# Patient Record
Sex: Female | Born: 1975 | Race: White | Hispanic: Yes | Marital: Married | Smoking: Never smoker
Health system: Southern US, Community
[De-identification: ages and names within clinical notes are randomized; demographics above are authoritative.]

## PROBLEM LIST (undated history)

## (undated) DIAGNOSIS — R7303 Prediabetes: Secondary | ICD-10-CM

## (undated) DIAGNOSIS — Z21 Asymptomatic human immunodeficiency virus [HIV] infection status: Secondary | ICD-10-CM

## (undated) DIAGNOSIS — E785 Hyperlipidemia, unspecified: Secondary | ICD-10-CM

## (undated) DIAGNOSIS — B20 Human immunodeficiency virus [HIV] disease: Secondary | ICD-10-CM

## (undated) HISTORY — DX: Prediabetes: R73.03

## (undated) HISTORY — DX: Hyperlipidemia, unspecified: E78.5

## (undated) HISTORY — DX: Asymptomatic human immunodeficiency virus (hiv) infection status: Z21

## (undated) HISTORY — DX: Human immunodeficiency virus (HIV) disease: B20

---

## 2003-10-10 ENCOUNTER — Encounter (INDEPENDENT_AMBULATORY_CARE_PROVIDER_SITE_OTHER): Payer: Self-pay | Admitting: *Deleted

## 2003-11-15 ENCOUNTER — Encounter: Admission: RE | Admit: 2003-11-15 | Discharge: 2003-11-15 | Payer: Self-pay | Admitting: Internal Medicine

## 2003-11-15 ENCOUNTER — Ambulatory Visit (HOSPITAL_COMMUNITY): Admission: RE | Admit: 2003-11-15 | Discharge: 2003-11-15 | Payer: Self-pay | Admitting: Internal Medicine

## 2003-12-04 ENCOUNTER — Encounter: Admission: RE | Admit: 2003-12-04 | Discharge: 2003-12-04 | Payer: Self-pay | Admitting: Internal Medicine

## 2003-12-19 ENCOUNTER — Encounter: Admission: RE | Admit: 2003-12-19 | Discharge: 2003-12-19 | Payer: Self-pay | Admitting: Internal Medicine

## 2004-01-09 ENCOUNTER — Encounter: Admission: RE | Admit: 2004-01-09 | Discharge: 2004-01-09 | Payer: Self-pay | Admitting: Infectious Diseases

## 2004-04-02 ENCOUNTER — Encounter: Admission: RE | Admit: 2004-04-02 | Discharge: 2004-04-02 | Payer: Self-pay | Admitting: Internal Medicine

## 2004-04-02 ENCOUNTER — Encounter (INDEPENDENT_AMBULATORY_CARE_PROVIDER_SITE_OTHER): Payer: Self-pay | Admitting: *Deleted

## 2004-07-02 ENCOUNTER — Ambulatory Visit (HOSPITAL_COMMUNITY): Admission: RE | Admit: 2004-07-02 | Discharge: 2004-07-02 | Payer: Self-pay | Admitting: Internal Medicine

## 2004-07-02 ENCOUNTER — Encounter: Admission: RE | Admit: 2004-07-02 | Discharge: 2004-07-02 | Payer: Self-pay | Admitting: Internal Medicine

## 2004-11-13 ENCOUNTER — Ambulatory Visit: Payer: Self-pay | Admitting: Internal Medicine

## 2004-11-13 ENCOUNTER — Ambulatory Visit (HOSPITAL_COMMUNITY): Admission: RE | Admit: 2004-11-13 | Discharge: 2004-11-13 | Payer: Self-pay | Admitting: Internal Medicine

## 2004-11-27 ENCOUNTER — Ambulatory Visit: Payer: Self-pay | Admitting: Internal Medicine

## 2005-05-12 ENCOUNTER — Ambulatory Visit: Payer: Self-pay | Admitting: Internal Medicine

## 2005-05-22 ENCOUNTER — Ambulatory Visit: Payer: Self-pay | Admitting: Internal Medicine

## 2005-05-22 ENCOUNTER — Ambulatory Visit (HOSPITAL_COMMUNITY): Admission: RE | Admit: 2005-05-22 | Discharge: 2005-05-22 | Payer: Self-pay | Admitting: Internal Medicine

## 2005-09-23 ENCOUNTER — Ambulatory Visit: Payer: Self-pay | Admitting: Obstetrics and Gynecology

## 2005-09-23 ENCOUNTER — Encounter (INDEPENDENT_AMBULATORY_CARE_PROVIDER_SITE_OTHER): Payer: Self-pay | Admitting: *Deleted

## 2005-12-02 ENCOUNTER — Ambulatory Visit: Payer: Self-pay | Admitting: Internal Medicine

## 2005-12-16 ENCOUNTER — Ambulatory Visit: Payer: Self-pay | Admitting: Internal Medicine

## 2006-03-02 ENCOUNTER — Encounter (INDEPENDENT_AMBULATORY_CARE_PROVIDER_SITE_OTHER): Payer: Self-pay | Admitting: *Deleted

## 2006-03-02 ENCOUNTER — Encounter: Admission: RE | Admit: 2006-03-02 | Discharge: 2006-03-02 | Payer: Self-pay | Admitting: Internal Medicine

## 2006-03-02 ENCOUNTER — Ambulatory Visit: Payer: Self-pay | Admitting: Internal Medicine

## 2006-03-17 ENCOUNTER — Ambulatory Visit: Payer: Self-pay | Admitting: Internal Medicine

## 2006-04-01 ENCOUNTER — Ambulatory Visit: Payer: Self-pay | Admitting: Obstetrics & Gynecology

## 2006-04-30 ENCOUNTER — Ambulatory Visit: Payer: Self-pay | Admitting: Obstetrics and Gynecology

## 2006-06-02 ENCOUNTER — Encounter: Admission: RE | Admit: 2006-06-02 | Discharge: 2006-06-02 | Payer: Self-pay | Admitting: Internal Medicine

## 2006-06-02 ENCOUNTER — Encounter (INDEPENDENT_AMBULATORY_CARE_PROVIDER_SITE_OTHER): Payer: Self-pay | Admitting: *Deleted

## 2006-06-02 ENCOUNTER — Ambulatory Visit: Payer: Self-pay | Admitting: Internal Medicine

## 2006-06-02 LAB — CONVERTED CEMR LAB: HIV 1 RNA Quant: 399 copies/mL

## 2006-06-23 ENCOUNTER — Ambulatory Visit: Payer: Self-pay | Admitting: Internal Medicine

## 2006-09-08 ENCOUNTER — Ambulatory Visit: Payer: Self-pay | Admitting: Internal Medicine

## 2006-09-14 DIAGNOSIS — B20 Human immunodeficiency virus [HIV] disease: Secondary | ICD-10-CM

## 2006-09-17 ENCOUNTER — Encounter (INDEPENDENT_AMBULATORY_CARE_PROVIDER_SITE_OTHER): Payer: Self-pay | Admitting: *Deleted

## 2006-09-17 ENCOUNTER — Encounter: Payer: Self-pay | Admitting: Internal Medicine

## 2006-09-17 ENCOUNTER — Ambulatory Visit: Payer: Self-pay | Admitting: Family Medicine

## 2006-09-22 ENCOUNTER — Ambulatory Visit (HOSPITAL_COMMUNITY): Admission: RE | Admit: 2006-09-22 | Discharge: 2006-09-22 | Payer: Self-pay | Admitting: Family Medicine

## 2006-10-15 ENCOUNTER — Ambulatory Visit: Payer: Self-pay | Admitting: Family Medicine

## 2006-10-20 ENCOUNTER — Ambulatory Visit (HOSPITAL_COMMUNITY): Admission: RE | Admit: 2006-10-20 | Discharge: 2006-10-20 | Payer: Self-pay | Admitting: Family Medicine

## 2006-11-05 ENCOUNTER — Ambulatory Visit: Payer: Self-pay | Admitting: Family Medicine

## 2006-11-12 ENCOUNTER — Encounter (INDEPENDENT_AMBULATORY_CARE_PROVIDER_SITE_OTHER): Payer: Self-pay | Admitting: *Deleted

## 2006-11-12 ENCOUNTER — Encounter: Admission: RE | Admit: 2006-11-12 | Discharge: 2006-11-12 | Payer: Self-pay | Admitting: Internal Medicine

## 2006-11-12 ENCOUNTER — Ambulatory Visit: Payer: Self-pay | Admitting: Internal Medicine

## 2006-11-12 LAB — CONVERTED CEMR LAB
Albumin: 3.6 g/dL (ref 3.5–5.2)
Alkaline Phosphatase: 49 units/L (ref 39–117)
CD4 Count: 150 microliters
Calcium: 8.6 mg/dL (ref 8.4–10.5)
Chloride: 105 meq/L (ref 96–112)
Eosinophils Relative: 2 % (ref 0–5)
Glucose, Bld: 97 mg/dL (ref 70–99)
HCT: 35.3 % — ABNORMAL LOW (ref 36.0–46.0)
HIV 1 RNA Quant: 177 copies/mL
HIV-1 RNA Quant, Log: 2.25 — ABNORMAL HIGH (ref <1.70)
Lymphocytes Relative: 9 % — ABNORMAL LOW (ref 12–46)
Neutro Abs: 6.2 10*3/uL (ref 1.7–7.7)
Neutrophils Relative %: 82 % — ABNORMAL HIGH (ref 43–77)
Platelets: 286 10*3/uL (ref 150–400)
Potassium: 4.3 meq/L (ref 3.5–5.3)
RDW: 13.6 % (ref 11.5–14.0)
Sodium: 134 meq/L — ABNORMAL LOW (ref 135–145)
Total Protein: 7 g/dL (ref 6.0–8.3)
WBC: 7.6 10*3/uL (ref 4.0–10.5)

## 2006-11-26 ENCOUNTER — Ambulatory Visit: Payer: Self-pay | Admitting: Family Medicine

## 2006-12-01 ENCOUNTER — Encounter (INDEPENDENT_AMBULATORY_CARE_PROVIDER_SITE_OTHER): Payer: Self-pay | Admitting: *Deleted

## 2006-12-01 ENCOUNTER — Ambulatory Visit: Payer: Self-pay | Admitting: Internal Medicine

## 2006-12-01 ENCOUNTER — Encounter: Admission: RE | Admit: 2006-12-01 | Discharge: 2006-12-01 | Payer: Self-pay | Admitting: Internal Medicine

## 2006-12-01 DIAGNOSIS — B029 Zoster without complications: Secondary | ICD-10-CM | POA: Insufficient documentation

## 2006-12-01 DIAGNOSIS — R87619 Unspecified abnormal cytological findings in specimens from cervix uteri: Secondary | ICD-10-CM | POA: Insufficient documentation

## 2006-12-21 ENCOUNTER — Encounter: Payer: Self-pay | Admitting: Internal Medicine

## 2006-12-24 ENCOUNTER — Ambulatory Visit: Payer: Self-pay | Admitting: Family Medicine

## 2007-01-04 ENCOUNTER — Encounter (INDEPENDENT_AMBULATORY_CARE_PROVIDER_SITE_OTHER): Payer: Self-pay | Admitting: *Deleted

## 2007-01-04 LAB — CONVERTED CEMR LAB

## 2007-01-07 ENCOUNTER — Ambulatory Visit: Payer: Self-pay | Admitting: Obstetrics & Gynecology

## 2007-01-17 ENCOUNTER — Encounter (INDEPENDENT_AMBULATORY_CARE_PROVIDER_SITE_OTHER): Payer: Self-pay | Admitting: *Deleted

## 2007-01-25 ENCOUNTER — Telehealth (INDEPENDENT_AMBULATORY_CARE_PROVIDER_SITE_OTHER): Payer: Self-pay | Admitting: Infectious Diseases

## 2007-01-28 ENCOUNTER — Ambulatory Visit: Payer: Self-pay | Admitting: *Deleted

## 2007-02-11 ENCOUNTER — Ambulatory Visit: Payer: Self-pay | Admitting: Family Medicine

## 2007-02-15 ENCOUNTER — Encounter: Admission: RE | Admit: 2007-02-15 | Discharge: 2007-02-15 | Payer: Self-pay | Admitting: Internal Medicine

## 2007-02-15 ENCOUNTER — Ambulatory Visit: Payer: Self-pay | Admitting: Internal Medicine

## 2007-02-15 LAB — CONVERTED CEMR LAB
Albumin: 3.3 g/dL — ABNORMAL LOW (ref 3.5–5.2)
Alkaline Phosphatase: 83 units/L (ref 39–117)
BUN: 6 mg/dL (ref 6–23)
CD4 Count: 430 microliters
Eosinophils Absolute: 0 10*3/uL (ref 0.0–0.7)
Eosinophils Relative: 1 % (ref 0–5)
Glucose, Bld: 90 mg/dL (ref 70–99)
HCT: 37.3 % (ref 36.0–46.0)
HDL: 40 mg/dL (ref >39)
Lymphs Abs: 1.4 10*3/uL (ref 0.7–3.3)
MCV: 111.7 fL — ABNORMAL HIGH (ref 78.0–100.0)
Monocytes Relative: 6 % (ref 3–11)
RBC: 3.34 M/uL — ABNORMAL LOW (ref 3.87–5.11)
Total Bilirubin: 0.5 mg/dL (ref 0.3–1.2)
Triglycerides: 588 mg/dL — ABNORMAL HIGH (ref <150)
WBC: 6.1 10*3/uL (ref 4.0–10.5)

## 2007-02-22 ENCOUNTER — Telehealth: Payer: Self-pay | Admitting: Internal Medicine

## 2007-02-25 ENCOUNTER — Ambulatory Visit: Payer: Self-pay | Admitting: Obstetrics & Gynecology

## 2007-03-02 ENCOUNTER — Ambulatory Visit: Payer: Self-pay | Admitting: Internal Medicine

## 2007-03-04 ENCOUNTER — Ambulatory Visit: Payer: Self-pay | Admitting: Obstetrics & Gynecology

## 2007-03-11 ENCOUNTER — Ambulatory Visit: Payer: Self-pay | Admitting: Family Medicine

## 2007-03-18 ENCOUNTER — Inpatient Hospital Stay (HOSPITAL_COMMUNITY): Admission: AD | Admit: 2007-03-18 | Discharge: 2007-03-20 | Payer: Self-pay | Admitting: Obstetrics and Gynecology

## 2007-03-18 ENCOUNTER — Ambulatory Visit: Payer: Self-pay | Admitting: Obstetrics & Gynecology

## 2007-03-18 ENCOUNTER — Ambulatory Visit: Payer: Self-pay | Admitting: Certified Nurse Midwife

## 2007-03-19 ENCOUNTER — Telehealth: Payer: Self-pay | Admitting: Internal Medicine

## 2007-04-20 ENCOUNTER — Telehealth: Payer: Self-pay | Admitting: Internal Medicine

## 2007-05-21 ENCOUNTER — Telehealth: Payer: Self-pay | Admitting: Internal Medicine

## 2007-06-01 ENCOUNTER — Ambulatory Visit: Payer: Self-pay | Admitting: Internal Medicine

## 2007-06-01 ENCOUNTER — Encounter: Admission: RE | Admit: 2007-06-01 | Discharge: 2007-06-01 | Payer: Self-pay | Admitting: Internal Medicine

## 2007-06-01 LAB — CONVERTED CEMR LAB
AST: 16 units/L (ref 0–37)
BUN: 10 mg/dL (ref 6–23)
Calcium: 8.4 mg/dL (ref 8.4–10.5)
Chloride: 106 meq/L (ref 96–112)
Creatinine, Ser: 0.69 mg/dL (ref 0.40–1.20)
Glucose, Bld: 92 mg/dL (ref 70–99)
HCT: 37.6 % (ref 36.0–46.0)
HIV 1 RNA Quant: 125 copies/mL — ABNORMAL HIGH (ref <50)
HIV-1 RNA Quant, Log: 2.1 — ABNORMAL HIGH (ref <1.70)
Hemoglobin: 12.7 g/dL (ref 12.0–15.0)
RDW: 13.4 % (ref 11.5–14.0)

## 2007-06-14 ENCOUNTER — Encounter (INDEPENDENT_AMBULATORY_CARE_PROVIDER_SITE_OTHER): Payer: Self-pay | Admitting: *Deleted

## 2007-06-15 ENCOUNTER — Ambulatory Visit: Payer: Self-pay | Admitting: Internal Medicine

## 2007-06-18 ENCOUNTER — Telehealth: Payer: Self-pay | Admitting: Internal Medicine

## 2007-07-19 ENCOUNTER — Telehealth: Payer: Self-pay | Admitting: Internal Medicine

## 2007-08-12 ENCOUNTER — Telehealth: Payer: Self-pay | Admitting: Internal Medicine

## 2007-08-31 ENCOUNTER — Ambulatory Visit: Payer: Self-pay | Admitting: Internal Medicine

## 2007-08-31 ENCOUNTER — Encounter: Admission: RE | Admit: 2007-08-31 | Discharge: 2007-08-31 | Payer: Self-pay | Admitting: Internal Medicine

## 2007-08-31 LAB — CONVERTED CEMR LAB
ALT: 18 units/L (ref 0–35)
Albumin: 4.1 g/dL (ref 3.5–5.2)
CO2: 19 meq/L (ref 19–32)
Calcium: 8.4 mg/dL (ref 8.4–10.5)
Chloride: 107 meq/L (ref 96–112)
Glucose, Bld: 87 mg/dL (ref 70–99)
HIV 1 RNA Quant: 69 copies/mL — ABNORMAL HIGH (ref <50)
HIV-1 RNA Quant, Log: 1.84 — ABNORMAL HIGH (ref <1.70)
MCV: 105 fL — ABNORMAL HIGH (ref 78.0–100.0)
Potassium: 4 meq/L (ref 3.5–5.3)
RBC: 3.81 M/uL — ABNORMAL LOW (ref 3.87–5.11)
Sodium: 138 meq/L (ref 135–145)
Total Protein: 7.8 g/dL (ref 6.0–8.3)
WBC: 6.4 10*3/uL (ref 4.0–10.5)

## 2007-09-09 ENCOUNTER — Telehealth: Payer: Self-pay | Admitting: Internal Medicine

## 2007-09-14 ENCOUNTER — Ambulatory Visit: Payer: Self-pay | Admitting: Internal Medicine

## 2007-09-20 ENCOUNTER — Telehealth: Payer: Self-pay | Admitting: Internal Medicine

## 2007-10-14 ENCOUNTER — Telehealth: Payer: Self-pay | Admitting: Internal Medicine

## 2007-11-19 ENCOUNTER — Telehealth: Payer: Self-pay | Admitting: Internal Medicine

## 2007-12-16 ENCOUNTER — Telehealth: Payer: Self-pay | Admitting: Internal Medicine

## 2007-12-20 ENCOUNTER — Telehealth: Payer: Self-pay | Admitting: Internal Medicine

## 2007-12-29 ENCOUNTER — Encounter: Admission: RE | Admit: 2007-12-29 | Discharge: 2007-12-29 | Payer: Self-pay | Admitting: Internal Medicine

## 2007-12-29 ENCOUNTER — Ambulatory Visit: Payer: Self-pay | Admitting: Internal Medicine

## 2007-12-29 LAB — CONVERTED CEMR LAB
ALT: 25 units/L (ref 0–35)
AST: 18 units/L (ref 0–37)
Albumin: 4.2 g/dL (ref 3.5–5.2)
Alkaline Phosphatase: 72 units/L (ref 39–117)
Calcium: 8.6 mg/dL (ref 8.4–10.5)
Chloride: 105 meq/L (ref 96–112)
Creatinine, Ser: 0.56 mg/dL (ref 0.40–1.20)
HIV 1 RNA Quant: <50 copies/mL (ref <50)
LDL Cholesterol: 114 mg/dL — ABNORMAL HIGH (ref 0–99)
MCHC: 33.3 g/dL (ref 30.0–36.0)
Platelets: 282 10*3/uL (ref 150–400)
Potassium: 4.4 meq/L (ref 3.5–5.3)
RDW: 13.2 % (ref 11.5–15.5)
Total CHOL/HDL Ratio: 4.3

## 2008-01-13 ENCOUNTER — Telehealth: Payer: Self-pay | Admitting: Internal Medicine

## 2008-01-25 ENCOUNTER — Ambulatory Visit: Payer: Self-pay | Admitting: Internal Medicine

## 2008-01-31 ENCOUNTER — Encounter (INDEPENDENT_AMBULATORY_CARE_PROVIDER_SITE_OTHER): Payer: Self-pay | Admitting: *Deleted

## 2008-02-07 ENCOUNTER — Telehealth (INDEPENDENT_AMBULATORY_CARE_PROVIDER_SITE_OTHER): Payer: Self-pay | Admitting: *Deleted

## 2008-02-15 ENCOUNTER — Encounter (INDEPENDENT_AMBULATORY_CARE_PROVIDER_SITE_OTHER): Payer: Self-pay | Admitting: *Deleted

## 2008-03-09 ENCOUNTER — Telehealth (INDEPENDENT_AMBULATORY_CARE_PROVIDER_SITE_OTHER): Payer: Self-pay | Admitting: *Deleted

## 2008-04-05 ENCOUNTER — Encounter: Payer: Self-pay | Admitting: Internal Medicine

## 2008-04-10 ENCOUNTER — Ambulatory Visit: Payer: Self-pay | Admitting: Internal Medicine

## 2008-04-10 ENCOUNTER — Encounter: Payer: Self-pay | Admitting: Internal Medicine

## 2008-04-10 LAB — CONVERTED CEMR LAB: Pap Smear: NORMAL

## 2008-04-12 ENCOUNTER — Telehealth (INDEPENDENT_AMBULATORY_CARE_PROVIDER_SITE_OTHER): Payer: Self-pay | Admitting: *Deleted

## 2008-04-17 ENCOUNTER — Encounter: Payer: Self-pay | Admitting: Internal Medicine

## 2008-05-09 ENCOUNTER — Telehealth (INDEPENDENT_AMBULATORY_CARE_PROVIDER_SITE_OTHER): Payer: Self-pay | Admitting: *Deleted

## 2008-06-06 ENCOUNTER — Telehealth: Payer: Self-pay | Admitting: Internal Medicine

## 2008-07-05 ENCOUNTER — Telehealth (INDEPENDENT_AMBULATORY_CARE_PROVIDER_SITE_OTHER): Payer: Self-pay | Admitting: *Deleted

## 2008-07-19 ENCOUNTER — Ambulatory Visit: Payer: Self-pay | Admitting: Internal Medicine

## 2008-07-19 LAB — CONVERTED CEMR LAB
ALT: 61 units/L — ABNORMAL HIGH (ref 0–35)
AST: 31 units/L (ref 0–37)
Alkaline Phosphatase: 74 units/L (ref 39–117)
BUN: 18 mg/dL (ref 6–23)
Calcium: 8.9 mg/dL (ref 8.4–10.5)
Chloride: 106 meq/L (ref 96–112)
Creatinine, Ser: 0.65 mg/dL (ref 0.40–1.20)
HCT: 40.2 % (ref 36.0–46.0)
Hemoglobin: 13.3 g/dL (ref 12.0–15.0)
MCHC: 33.1 g/dL (ref 30.0–36.0)
Platelets: 280 10*3/uL (ref 150–400)
RDW: 13.3 % (ref 11.5–15.5)
Total Bilirubin: 0.2 mg/dL — ABNORMAL LOW (ref 0.3–1.2)

## 2008-08-03 ENCOUNTER — Telehealth (INDEPENDENT_AMBULATORY_CARE_PROVIDER_SITE_OTHER): Payer: Self-pay | Admitting: *Deleted

## 2008-08-03 ENCOUNTER — Ambulatory Visit: Payer: Self-pay | Admitting: Internal Medicine

## 2008-08-03 DIAGNOSIS — L659 Nonscarring hair loss, unspecified: Secondary | ICD-10-CM | POA: Insufficient documentation

## 2008-08-31 ENCOUNTER — Telehealth (INDEPENDENT_AMBULATORY_CARE_PROVIDER_SITE_OTHER): Payer: Self-pay | Admitting: *Deleted

## 2008-09-26 ENCOUNTER — Telehealth (INDEPENDENT_AMBULATORY_CARE_PROVIDER_SITE_OTHER): Payer: Self-pay | Admitting: *Deleted

## 2008-10-27 ENCOUNTER — Telehealth (INDEPENDENT_AMBULATORY_CARE_PROVIDER_SITE_OTHER): Payer: Self-pay | Admitting: *Deleted

## 2008-11-07 ENCOUNTER — Ambulatory Visit: Payer: Self-pay | Admitting: Internal Medicine

## 2008-11-07 LAB — CONVERTED CEMR LAB
HIV 1 RNA Quant: 328 copies/mL — ABNORMAL HIGH (ref <48)
HIV-1 RNA Quant, Log: 2.52 — ABNORMAL HIGH (ref <1.68)

## 2008-11-23 ENCOUNTER — Telehealth (INDEPENDENT_AMBULATORY_CARE_PROVIDER_SITE_OTHER): Payer: Self-pay | Admitting: *Deleted

## 2008-11-28 ENCOUNTER — Ambulatory Visit: Payer: Self-pay | Admitting: Internal Medicine

## 2008-11-28 DIAGNOSIS — M545 Low back pain: Secondary | ICD-10-CM

## 2008-12-20 ENCOUNTER — Telehealth (INDEPENDENT_AMBULATORY_CARE_PROVIDER_SITE_OTHER): Payer: Self-pay | Admitting: *Deleted

## 2008-12-20 ENCOUNTER — Encounter: Payer: Self-pay | Admitting: Internal Medicine

## 2009-01-11 ENCOUNTER — Encounter (INDEPENDENT_AMBULATORY_CARE_PROVIDER_SITE_OTHER): Payer: Self-pay | Admitting: *Deleted

## 2009-01-17 ENCOUNTER — Telehealth (INDEPENDENT_AMBULATORY_CARE_PROVIDER_SITE_OTHER): Payer: Self-pay | Admitting: *Deleted

## 2009-01-31 ENCOUNTER — Encounter (INDEPENDENT_AMBULATORY_CARE_PROVIDER_SITE_OTHER): Payer: Self-pay | Admitting: *Deleted

## 2009-02-15 ENCOUNTER — Telehealth (INDEPENDENT_AMBULATORY_CARE_PROVIDER_SITE_OTHER): Payer: Self-pay | Admitting: *Deleted

## 2009-03-14 ENCOUNTER — Telehealth (INDEPENDENT_AMBULATORY_CARE_PROVIDER_SITE_OTHER): Payer: Self-pay | Admitting: *Deleted

## 2009-04-16 ENCOUNTER — Telehealth (INDEPENDENT_AMBULATORY_CARE_PROVIDER_SITE_OTHER): Payer: Self-pay | Admitting: *Deleted

## 2009-05-10 ENCOUNTER — Telehealth (INDEPENDENT_AMBULATORY_CARE_PROVIDER_SITE_OTHER): Payer: Self-pay | Admitting: *Deleted

## 2009-05-22 ENCOUNTER — Ambulatory Visit: Payer: Self-pay | Admitting: Internal Medicine

## 2009-05-22 LAB — CONVERTED CEMR LAB
HIV 1 RNA Quant: 95 copies/mL — ABNORMAL HIGH (ref <48)
HIV-1 RNA Quant, Log: 1.98 — ABNORMAL HIGH (ref <1.68)

## 2009-06-05 ENCOUNTER — Ambulatory Visit: Payer: Self-pay | Admitting: Internal Medicine

## 2009-06-05 ENCOUNTER — Encounter: Payer: Self-pay | Admitting: Internal Medicine

## 2009-06-05 ENCOUNTER — Telehealth (INDEPENDENT_AMBULATORY_CARE_PROVIDER_SITE_OTHER): Payer: Self-pay | Admitting: *Deleted

## 2009-06-05 LAB — CONVERTED CEMR LAB
Cholesterol: 207 mg/dL — ABNORMAL HIGH (ref 0–200)
Triglycerides: 142 mg/dL (ref <150)
VLDL: 28 mg/dL (ref 0–40)

## 2009-06-13 ENCOUNTER — Encounter: Payer: Self-pay | Admitting: Internal Medicine

## 2009-07-03 ENCOUNTER — Telehealth (INDEPENDENT_AMBULATORY_CARE_PROVIDER_SITE_OTHER): Payer: Self-pay | Admitting: *Deleted

## 2009-08-01 ENCOUNTER — Telehealth (INDEPENDENT_AMBULATORY_CARE_PROVIDER_SITE_OTHER): Payer: Self-pay | Admitting: *Deleted

## 2009-08-29 ENCOUNTER — Telehealth (INDEPENDENT_AMBULATORY_CARE_PROVIDER_SITE_OTHER): Payer: Self-pay | Admitting: *Deleted

## 2009-09-27 ENCOUNTER — Telehealth (INDEPENDENT_AMBULATORY_CARE_PROVIDER_SITE_OTHER): Payer: Self-pay | Admitting: *Deleted

## 2009-10-09 ENCOUNTER — Ambulatory Visit: Payer: Self-pay | Admitting: Internal Medicine

## 2009-10-09 LAB — CONVERTED CEMR LAB
Albumin: 3.8 g/dL (ref 3.5–5.2)
Alkaline Phosphatase: 55 units/L (ref 39–117)
BUN: 13 mg/dL (ref 6–23)
CO2: 20 meq/L (ref 19–32)
Calcium: 7.7 mg/dL — ABNORMAL LOW (ref 8.4–10.5)
Chloride: 105 meq/L (ref 96–112)
Eosinophils Absolute: 0.1 10*3/uL (ref 0.0–0.7)
Eosinophils Relative: 2 % (ref 0–5)
Glucose, Bld: 109 mg/dL — ABNORMAL HIGH (ref 70–99)
HDL: 49 mg/dL (ref >39)
Hemoglobin: 13.2 g/dL (ref 12.0–15.0)
LDL Cholesterol: 139 mg/dL — ABNORMAL HIGH (ref 0–99)
Lymphs Abs: 2 10*3/uL (ref 0.7–4.0)
MCHC: 33.3 g/dL (ref 30.0–36.0)
Monocytes Absolute: 0.4 10*3/uL (ref 0.1–1.0)
Platelets: 302 10*3/uL (ref 150–400)
Potassium: 4.1 meq/L (ref 3.5–5.3)
RBC: 4.42 M/uL (ref 3.87–5.11)
RDW: 13 % (ref 11.5–15.5)
Sodium: 139 meq/L (ref 135–145)
Total CHOL/HDL Ratio: 4.4
Total Protein: 7.1 g/dL (ref 6.0–8.3)
VLDL: 27 mg/dL (ref 0–40)

## 2009-10-23 ENCOUNTER — Ambulatory Visit: Payer: Self-pay | Admitting: Internal Medicine

## 2009-11-06 ENCOUNTER — Telehealth (INDEPENDENT_AMBULATORY_CARE_PROVIDER_SITE_OTHER): Payer: Self-pay | Admitting: *Deleted

## 2009-11-26 ENCOUNTER — Telehealth: Payer: Self-pay | Admitting: Internal Medicine

## 2009-11-29 ENCOUNTER — Telehealth (INDEPENDENT_AMBULATORY_CARE_PROVIDER_SITE_OTHER): Payer: Self-pay | Admitting: *Deleted

## 2009-12-12 ENCOUNTER — Telehealth (INDEPENDENT_AMBULATORY_CARE_PROVIDER_SITE_OTHER): Payer: Self-pay | Admitting: *Deleted

## 2009-12-18 ENCOUNTER — Encounter (INDEPENDENT_AMBULATORY_CARE_PROVIDER_SITE_OTHER): Payer: Self-pay | Admitting: *Deleted

## 2009-12-25 ENCOUNTER — Ambulatory Visit: Payer: Self-pay | Admitting: Internal Medicine

## 2009-12-26 ENCOUNTER — Encounter: Payer: Self-pay | Admitting: Internal Medicine

## 2009-12-27 ENCOUNTER — Telehealth (INDEPENDENT_AMBULATORY_CARE_PROVIDER_SITE_OTHER): Payer: Self-pay | Admitting: *Deleted

## 2010-01-21 ENCOUNTER — Telehealth (INDEPENDENT_AMBULATORY_CARE_PROVIDER_SITE_OTHER): Payer: Self-pay | Admitting: *Deleted

## 2010-01-30 ENCOUNTER — Encounter (INDEPENDENT_AMBULATORY_CARE_PROVIDER_SITE_OTHER): Payer: Self-pay | Admitting: *Deleted

## 2010-02-21 ENCOUNTER — Telehealth (INDEPENDENT_AMBULATORY_CARE_PROVIDER_SITE_OTHER): Payer: Self-pay | Admitting: *Deleted

## 2010-02-25 ENCOUNTER — Ambulatory Visit: Payer: Self-pay | Admitting: Internal Medicine

## 2010-02-26 ENCOUNTER — Encounter (INDEPENDENT_AMBULATORY_CARE_PROVIDER_SITE_OTHER): Payer: Self-pay | Admitting: *Deleted

## 2010-02-27 ENCOUNTER — Telehealth (INDEPENDENT_AMBULATORY_CARE_PROVIDER_SITE_OTHER): Payer: Self-pay | Admitting: *Deleted

## 2010-03-27 ENCOUNTER — Telehealth (INDEPENDENT_AMBULATORY_CARE_PROVIDER_SITE_OTHER): Payer: Self-pay | Admitting: *Deleted

## 2010-04-23 ENCOUNTER — Telehealth (INDEPENDENT_AMBULATORY_CARE_PROVIDER_SITE_OTHER): Payer: Self-pay | Admitting: *Deleted

## 2010-04-23 ENCOUNTER — Ambulatory Visit: Payer: Self-pay | Admitting: Internal Medicine

## 2010-04-23 LAB — CONVERTED CEMR LAB
ALT: 66 units/L — ABNORMAL HIGH (ref 0–35)
AST: 41 units/L — ABNORMAL HIGH (ref 0–37)
BUN: 10 mg/dL (ref 6–23)
Basophils Absolute: 0 10*3/uL (ref 0.0–0.1)
Basophils Relative: 0 % (ref 0–1)
Creatinine, Ser: 0.55 mg/dL (ref 0.40–1.20)
Eosinophils Absolute: 0.2 10*3/uL (ref 0.0–0.7)
Eosinophils Relative: 3 % (ref 0–5)
HDL: 47 mg/dL (ref >39)
HIV 1 RNA Quant: <48 copies/mL (ref <48)
MCHC: 33.5 g/dL (ref 30.0–36.0)
MCV: 97.6 fL (ref 78.0–100.0)
Neutrophils Relative %: 52 % (ref 43–77)
Platelets: 281 10*3/uL (ref 150–400)
RDW: 13 % (ref 11.5–15.5)
Total Bilirubin: 0.4 mg/dL (ref 0.3–1.2)
Total CHOL/HDL Ratio: 4.5
VLDL: 32 mg/dL (ref 0–40)
WBC: 5.2 10*3/uL (ref 4.0–10.5)

## 2010-05-07 ENCOUNTER — Ambulatory Visit: Payer: Self-pay | Admitting: Internal Medicine

## 2010-05-07 DIAGNOSIS — R109 Unspecified abdominal pain: Secondary | ICD-10-CM | POA: Insufficient documentation

## 2010-05-14 ENCOUNTER — Telehealth (INDEPENDENT_AMBULATORY_CARE_PROVIDER_SITE_OTHER): Payer: Self-pay | Admitting: *Deleted

## 2010-05-31 ENCOUNTER — Ambulatory Visit: Payer: Self-pay | Admitting: Internal Medicine

## 2010-06-04 ENCOUNTER — Encounter: Payer: Self-pay | Admitting: Internal Medicine

## 2010-06-07 ENCOUNTER — Telehealth (INDEPENDENT_AMBULATORY_CARE_PROVIDER_SITE_OTHER): Payer: Self-pay | Admitting: *Deleted

## 2010-07-09 ENCOUNTER — Telehealth (INDEPENDENT_AMBULATORY_CARE_PROVIDER_SITE_OTHER): Payer: Self-pay | Admitting: *Deleted

## 2010-08-02 ENCOUNTER — Telehealth (INDEPENDENT_AMBULATORY_CARE_PROVIDER_SITE_OTHER): Payer: Self-pay | Admitting: *Deleted

## 2010-08-06 ENCOUNTER — Ambulatory Visit: Payer: Self-pay | Admitting: Internal Medicine

## 2010-08-06 LAB — CONVERTED CEMR LAB
AST: 47 units/L — ABNORMAL HIGH (ref 0–37)
Alkaline Phosphatase: 62 units/L (ref 39–117)
BUN: 12 mg/dL (ref 6–23)
Basophils Relative: 0 % (ref 0–1)
Eosinophils Absolute: 0.1 10*3/uL (ref 0.0–0.7)
Eosinophils Relative: 2 % (ref 0–5)
Glucose, Bld: 86 mg/dL (ref 70–99)
HCT: 36.4 % (ref 36.0–46.0)
HIV 1 RNA Quant: <20 copies/mL (ref <20)
HIV-1 RNA Quant, Log: <1.3 (ref <1.30)
Lymphs Abs: 1.7 10*3/uL (ref 0.7–4.0)
MCHC: 34.1 g/dL (ref 30.0–36.0)
MCV: 89 fL (ref 78.0–100.0)
Monocytes Relative: 8 % (ref 3–12)
Neutrophils Relative %: 56 % (ref 43–77)
Platelets: 328 10*3/uL (ref 150–400)
Potassium: 4.4 meq/L (ref 3.5–5.3)
RBC: 4.09 M/uL (ref 3.87–5.11)
Total Bilirubin: 0.3 mg/dL (ref 0.3–1.2)
WBC: 5.2 10*3/uL (ref 4.0–10.5)

## 2010-09-02 ENCOUNTER — Telehealth (INDEPENDENT_AMBULATORY_CARE_PROVIDER_SITE_OTHER): Payer: Self-pay | Admitting: *Deleted

## 2010-09-10 ENCOUNTER — Ambulatory Visit: Payer: Self-pay | Admitting: Internal Medicine

## 2010-09-10 DIAGNOSIS — F4321 Adjustment disorder with depressed mood: Secondary | ICD-10-CM

## 2010-10-08 ENCOUNTER — Encounter (INDEPENDENT_AMBULATORY_CARE_PROVIDER_SITE_OTHER): Payer: Self-pay | Admitting: *Deleted

## 2010-11-23 ENCOUNTER — Emergency Department (HOSPITAL_COMMUNITY)
Admission: EM | Admit: 2010-11-23 | Discharge: 2010-11-23 | Payer: Self-pay | Source: Home / Self Care | Admitting: Emergency Medicine

## 2010-12-01 ENCOUNTER — Encounter: Payer: Self-pay | Admitting: *Deleted

## 2010-12-05 ENCOUNTER — Encounter: Payer: Self-pay | Admitting: Internal Medicine

## 2010-12-05 ENCOUNTER — Ambulatory Visit
Admission: RE | Admit: 2010-12-05 | Discharge: 2010-12-05 | Payer: Self-pay | Source: Home / Self Care | Attending: Internal Medicine | Admitting: Internal Medicine

## 2010-12-05 LAB — CONVERTED CEMR LAB
ALT: 60 units/L — ABNORMAL HIGH (ref 0–35)
AST: 36 units/L (ref 0–37)
Albumin: 4.1 g/dL (ref 3.5–5.2)
Basophils Absolute: 0 10*3/uL (ref 0.0–0.1)
CO2: 26 meq/L (ref 19–32)
Calcium: 8.6 mg/dL (ref 8.4–10.5)
Chloride: 105 meq/L (ref 96–112)
Cholesterol: 233 mg/dL — ABNORMAL HIGH (ref 0–200)
Creatinine, Ser: 0.58 mg/dL (ref 0.40–1.20)
Eosinophils Relative: 2 % (ref 0–5)
HCT: 40.5 % (ref 36.0–46.0)
HIV 1 RNA Quant: <20 copies/mL (ref <20)
HIV-1 RNA Quant, Log: <1.3 (ref <1.30)
Lymphocytes Relative: 35 % (ref 12–46)
Lymphs Abs: 2.1 10*3/uL (ref 0.7–4.0)
Neutrophils Relative %: 56 % (ref 43–77)
Platelets: 312 10*3/uL (ref 150–400)
Potassium: 4.5 meq/L (ref 3.5–5.3)
RDW: 12.5 % (ref 11.5–15.5)
Sodium: 136 meq/L (ref 135–145)
Total CHOL/HDL Ratio: 4.2
Total Protein: 7.6 g/dL (ref 6.0–8.3)
Triglycerides: 122 mg/dL (ref <150)
WBC: 5.9 10*3/uL (ref 4.0–10.5)

## 2010-12-06 LAB — T-HELPER CELL (CD4) - (RCID CLINIC ONLY)
CD4 % Helper T Cell: 35 % (ref 33–55)
CD4 T Cell Abs: 690 uL (ref 400–2700)

## 2010-12-10 NOTE — Miscellaneous (Signed)
Summary: RW Update  Clinical Lists Changes  Observations: Added new observation of PREGTHISYR: No (12/18/2009 7:51)

## 2010-12-10 NOTE — Progress Notes (Signed)
Summary: [facility]/pt assist med arrived for Jan  Phone Note Refill Request      Prescriptions: ATRIPLA 600-200-300 MG TABS (EFAVIRENZ-EMTRICITAB-TENOFOVIR) Take 1 tablet by mouth once a day  #30 x 0   Entered by:   Paulo Fruit  BS,CPht II,MPH   Authorized by:   Cliffton Asters MD   Signed by:   Paulo Fruit  BS,CPht II,MPH on 11/29/2009   Method used:   Samples Given   RxID:   [id number]   Patient Assist Medication Verification: Medication: Atripla Lot# [id number] Exp Date:06 2013 Tech approval:MLD               **Patient had lost their medication and picked up a sample supply yesterday** Patient normally comes to pick up medications every month before running out. Isel Skufca  BS,CPht II,MPH  November 29, 2009 4:27 PM

## 2010-12-10 NOTE — Letter (Signed)
Summary: Results Follow-up Letter  [facility]  [address]   [place], [place] [address]   Phone: [phone]  Fax: [phone]          June 04, 2010  [address] [place], [place]  [address]  [place] PEREZ-FUERTE, Los siguientes son los Vining de su reciente prueba (s): Resultado de la prueba Papanicolau Normal_XXX___ No Normal_____ Comentarios: Todo fue normal. Nos vemos en un ao para su prxima prueba Pap. Gracias por venir al [employer] para su atencin. Larna [facility]

## 2010-12-10 NOTE — Progress Notes (Signed)
Summary: ncadap med arrive for sept-pt aware  Phone Note Refill Request      Prescriptions: ATRIPLA 600-200-300 MG TABS (EFAVIRENZ-EMTRICITAB-TENOFOVIR) Take 1 tablet by mouth once a day  #30 x 0   Entered by:   Paulo Fruit  BS,CPht II,MPH   Authorized by:   Cliffton Asters MD   Signed by:   Paulo Fruit  BS,CPht II,MPH on 08/02/2010   Method used:   Samples Given   RxID:   [id number]  Patient Assist Medication Verification: Medication name: Neva Seat # [id number] Tech approval:mld Call placed to patient with message that assistance medications are ready for pick-up. Rebecca Cairns  BS,CPht II,MPH  August 02, 2010 12:08 PM

## 2010-12-10 NOTE — Progress Notes (Signed)
Summary: ncadap med arrived for aug  Phone Note Refill Request      Prescriptions: ATRIPLA 600-200-300 MG TABS (EFAVIRENZ-EMTRICITAB-TENOFOVIR) Take 1 tablet by mouth once a day  #30 x 0   Entered by:   Paulo Fruit  BS,CPht II,MPH   Authorized by:   Cliffton Asters MD   Signed by:   Paulo Fruit  BS,CPht II,MPH on 07/09/2010   Method used:   Samples Given   RxID:   [id number]  Patient Assist Medication Verification: Medication name:Atripla RX # [id number] Tech approval:MLD  Patient is on her way to pick up. Hafsa Lohn  BS,CPht II,MPH  July 09, 2010 2:37 PM

## 2010-12-10 NOTE — Assessment & Plan Note (Signed)
Summary: F/U/CALLED FOR INTERPRETER/VS   CC:  follow-up visit and needs PAP in July 2011.  History of Present Illness: Kathleen Savage is in for her routine visit.  She states that she was unable to come and pick up her medication and therefore was out of her Atripla for 3 days.  Otherwise she has not missed a single dose.  Her only recent problem has been some intermittent fleeting, abdominal pains.  They tend to occur early in the morning before she eats breakfast but she does not feel that they are related to being hungry.  They come and go very quickly but can be as painful as 9 on a scale of one to 10.  They happen several times each week.  She does not know of anything that causes them were prevents them.  She does not have any nausea, vomiting, constipation, diarrhea or other problems.  She says she has tried to lose weight in the past but does not know how to put together a proper diet.  She says she would very much like to be able to lose weight and keep it off.  Preventive Screening-Counseling & Management  Alcohol-Tobacco     Alcohol drinks/day: 0     Smoking Status: never     Passive Smoke Exposure: no  Caffeine-Diet-Exercise     Caffeine use/day: yes     Does Patient Exercise: no, not walking fast     Type of exercise: walking     Exercise (avg: min/session): 30-60     Times/week: <3  Hep-HIV-STD-Contraception     HIV Risk: no risk noted  Safety-Violence-Falls     Seat Belt Use: yes  Comments: given condoms      Sexual History:  currently monogamous.        Drug Use:  never.     Prior Medication List:  ATRIPLA 600-200-300 MG TABS (EFAVIRENZ-EMTRICITAB-TENOFOVIR) Take 1 tablet by mouth once a day   Current Allergies (reviewed today): No known allergies  Vital Signs:  Patient profile:   35 year old female Menstrual status:  regular LMP:     04/03/2010 Height:      65 inches (165.10 cm) Weight:      237.5 pounds (107.95 kg) BMI:     39.66 Temp:     98.6  degrees F oral Pulse rate:   71 / minute BP sitting:   111 / 74  (left arm) Cuff size:   large  Vitals Entered By: Jennet Maduro RN (May 07, 2010 9:17 AM) CC: follow-up visit, needs PAP in July 2011 Is Patient Diabetic? No Pain Assessment Patient in pain? no      Nutritional Status BMI of > 30 = obese Nutritional Status Detail appetite "good'  Have you ever been in a relationship where you felt threatened, hurt or afraid?No   Does patient need assistance? Functional Status Self care Ambulation Normal Comments I was without meds for three days.  I came by on Friday afternoon to pick them up and the office was closed at 2:30 when I came.  I picked them up yesterday. LMP (date): 04/03/2010 LMP - Character: normal     Enter LMP: 04/03/2010 Last PAP Result NEGATIVE FOR INTRAEPITHELIAL LESIONS OR MALIGNANCY.   Physical Exam  General:  alert.   Mouth:  pharynx pink and moist.  good dentition, no erythema, and no exudates.   Lungs:  normal breath sounds.  no crackles and no wheezes.   Heart:  normal rate, regular rhythm, and  no murmur.   Abdomen:  soft, non-tender, normal bowel sounds, no masses, no hepatomegaly, and no splenomegaly.   Skin:  no rashes.   Psych:  normally interactive, good eye contact, not anxious appearing, and not depressed appearing.          Medication Adherence: 05/07/2010   Adherence to medications reviewed with patient. Counseling to provide adequate adherence provided                                Impression & Recommendations:  Problem # 1:  HIV DISEASE (ICD-042) Her HIV infection remains under excellent control.  I will not make any changes today. Diagnostics Reviewed:  CD4: 560 (04/24/2010)   WBC: 5.2 (04/23/2010)   Hgb: 12.5 (04/23/2010)   HCT: 37.3 (04/23/2010)   Platelets: 281 (04/23/2010) HIV-1 RNA: <48 copies/mL (04/23/2010)   HBSAg: NO (01/04/2007)  Problem # 2:  MORBID OBESITY (ICD-278.01) I will refer her to Lup[facility] Leash Riley's  lifestyle class with an interpreter. Orders: Est. Patient Level IV (54627)  Problem # 3:  ABDOMINAL PAIN (ICD-789.00) I am not sure what is causing her abdominal pain.  If it continues I will consider a right upper quadrant ultrasound. Orders: Est. Patient Level IV (03500)  Patient Instructions: 1)  Please schedule a follow-up appointment in 3 months.

## 2010-12-10 NOTE — Miscellaneous (Signed)
  Clinical Lists Changes  Observations: Added new observation of YEARAIDSPOS: 2004  (10/08/2010 11:34)

## 2010-12-10 NOTE — Progress Notes (Signed)
Summary: [place] meds arrived for Mar  Phone Note Refill Request      Prescriptions: KALETRA 200-50 MG TABS (LOPINAVIR-RITONAVIR) Take 2 tablets by mouth two times a day  #120 x 0   Entered by:   Paulo Fruit  BS,CPht II,MPH   Authorized by:   Cliffton Asters MD   Signed by:   Paulo Fruit  BS,CPht II,MPH on 01/21/2010   Method used:   Samples Given   RxID:   [id number] COMBIVIR 150-300 MG TABS (LAMIVUDINE-ZIDOVUDINE) Take 1 tablet by mouth two times a day  #60 x 0   Entered by:   Paulo Fruit  BS,CPht II,MPH   Authorized by:   Cliffton Asters MD   Signed by:   Paulo Fruit  BS,CPht II,MPH on 01/21/2010   Method used:   Samples Given   RxID:   [id number]   Patient Assist Medication Verification: Medication: Kaletra 200/50mg  Lot# [id number] Exp Date:22 Aug 2012 Tech approval:MLD                Patient Assist Medication Verification: Medication: Combivir 150mg /300mg  Lot# [id number] Exp Date:Oct 2014 Tech approval:MLD Tried to contact patient. Phone number on file does not work at the moment. Mahalia Dykes  BS,CPht II,MPH  January 21, 2010 3:12 PM

## 2010-12-10 NOTE — Progress Notes (Signed)
Summary: Pt lost medication  Phone Note Call from Patient   Caller: Patient Details for Reason: lost med Summary of Call: Patient called to inform me that the bottle she just picked up. she has lost it.  Wants a sample of Atripla until her next order arrive next month.  Patient was told that she can have a sample of Atripla. Will get ready when she come to pick up. Initial call taken by: Paulo Fruit  BS,CPht II,MPH,  November 26, 2009 2:55 PM    Prescriptions: ATRIPLA 600-200-300 MG TABS (EFAVIRENZ-EMTRICITAB-TENOFOVIR) Take 1 tablet by mouth once a day  #30 x 0   Entered by:   Paulo Fruit  BS,CPht II,MPH   Authorized by:   Cliffton Asters MD   Signed by:   Paulo Fruit  BS,CPht II,MPH on 11/27/2009   Method used:   Samples Given   RxID:   [id number]  Sample Given, Lot #: [id number] Expiration Date:06 2011 Patient has been instructed regarding the correct time, dose and frequency of taking this med, including desired effects and most common side effects. Prescription/Samples picked up by: patient  Rosali Augello  BS,CPht II,MPH  November 27, 2009 12:18 PM

## 2010-12-10 NOTE — Miscellaneous (Signed)
Summary: Orders Update  Clinical Lists Changes  Orders: Added new Test order of T-CBC w/Diff (85025-10010) - Signed Added new Test order of T-CD4SP ([facility]) (CD4SP) - Signed Added new Test order of T-Comprehensive Metabolic Panel (80053-22900) - Signed Added new Test order of T-HIV Viral Load (87536-83319) - Signed Added new Test order of T-RPR (Syphilis) (86592-23940) - Signed 

## 2010-12-10 NOTE — Progress Notes (Signed)
Summary: [facility]AP/pt assist meds arrived for Apr  Phone Note Refill Request      Prescriptions: KALETRA 200-50 MG TABS (LOPINAVIR-RITONAVIR) Take 2 tablets by mouth two times a day  #120 x 0   Entered by:   Paulo Fruit  BS,CPht II,MPH   Authorized by:   Cliffton Asters MD   Signed by:   Paulo Fruit  BS,CPht II,MPH on 02/21/2010   Method used:   Samples Given   RxID:   [id number] COMBIVIR 150-300 MG TABS (LAMIVUDINE-ZIDOVUDINE) Take 1 tablet by mouth two times a day  #60 x 0   Entered by:   Paulo Fruit  BS,CPht II,MPH   Authorized by:   Cliffton Asters MD   Signed by:   Paulo Fruit  BS,CPht II,MPH on 02/21/2010   Method used:   Samples Given   RxID:   [id number]   Patient Assist Medication Verification: Medication: Kaletra 200/50mg  Lot# [id number] Exp Date:04 Jul 2012 Tech approval:MLD                Patient Assist Medication Verification: Medication:Combivir 150mg /300mg  BMW#[id number] Exp Date:Oct 2014 Tech approval:MLD Call placed to patient with message that assistance medications are ready for pick-up. Left messge on patient's VM Khiana Camino  BS,CPht II,MPH  February 21, 2010 4:22 PM

## 2010-12-10 NOTE — Progress Notes (Signed)
Summary: NCADAP/pt assist med arrived for May  Phone Note Refill Request      Prescriptions: ATRIPLA 600-200-300 MG TABS (EFAVIRENZ-EMTRICITAB-TENOFOVIR) Take 1 tablet by mouth once a day  #30 x 0   Entered by:   Paulo Fruit  BS,CPht II,MPH   Authorized by:   Cliffton Asters MD   Signed by:   Paulo Fruit  BS,CPht II,MPH on 03/27/2010   Method used:   Samples Given   RxID:   [id number]  Patient Assist Medication Verification: Medication name: Atripla RX #  [id number] Tech approval:MLD Call placed to patient with message that assistance medications are ready for pick-up. Left message for patient. Bettye Sitton  BS,CPht II,MPH  Mar 27, 2010 11:57 AM

## 2010-12-10 NOTE — Assessment & Plan Note (Signed)
Summary: F/U OV/VS   CC:  follow-up visit and sore throat.  History of Present Illness: Kathleen Savage is in for her routine visit.  She has not had any further problems with abdominal pain since her last visit.  She has had no problems obtaining her Atripla and has not missed a single dose. Her father died several weeks ago after a Zanya Lindo illness in [place].  It had been about 7 years since she had seen him.  She has been quite sad since learning of his death but says that she has family and friends who are very supportive and who she can talk to about this.  She says she is feeling a little bit better.  She has been sexually active with her husband but has been using condoms.  They have been thinking about getting pregnant again.  They used artificial insemination during her first pregnancy.  He was tested for HIV about a year ago and was negative.  Preventive Screening-Counseling & Management  Alcohol-Tobacco     Alcohol drinks/day: 0     Smoking Status: never     Passive Smoke Exposure: no  Caffeine-Diet-Exercise     Caffeine use/day: yes     Does Patient Exercise: no, not walking fast     Type of exercise: walking     Exercise (avg: min/session): 30-60     Times/week: <3  Hep-HIV-STD-Contraception     HIV Risk: no risk noted     HIV Risk Counseling: not indicated-no HIV risk noted  Safety-Violence-Falls     Seat Belt Use: yes  Comments: given condoms      Sexual History:  currently monogamous.        Drug Use:  never.     Prior Medication List:  ATRIPLA 600-200-300 MG TABS (EFAVIRENZ-EMTRICITAB-TENOFOVIR) Take 1 tablet by mouth once a day   Current Allergies (reviewed today): No known allergies  Vital Signs:  Patient profile:   35 year old female Menstrual status:  regular LMP:     09/09/2010 Height:      65 inches (165.10 cm) Weight:      236.75 pounds (107.61 kg) BMI:     39.54 Temp:     98.2 degrees F (36.78 degrees C) oral Pulse rate:   73 / minute BP sitting:    108 / 71  (left arm) Cuff size:   large  Vitals Entered By: Jennet Maduro RN (September 10, 2010 9:10 AM) CC: follow-up visit, sore throat Is Patient Diabetic? No Pain Assessment Patient in pain? yes     Location: sore throat Intensity: 5 Type: soreness Onset of pain  started yesterday Nutritional Status BMI of > 30 = obese Nutritional Status Detail appetite "decreased?  Have you ever been in a relationship where you felt threatened, hurt or afraid?No   Does patient need assistance? Functional Status Self care Ambulation Normal Comments no missed doses of rxes LMP (date): 09/09/2010 LMP - Character: normal     Enter LMP: 09/09/2010 Last PAP Result NEGATIVE FOR INTRAEPITHELIAL LESIONS OR MALIGNANCY.   Physical Exam  General:  alert and overweight-appearing.   Mouth:  pharynx pink and moist.  good dentition, no erythema, and no exudates.   Lungs:  normal breath sounds.  no crackles and no wheezes.   Heart:  normal rate, regular rhythm, and no murmur.   Abdomen:  soft, non-tender, normal bowel sounds, no masses, no hepatomegaly, and no splenomegaly.   Skin:  no rashes.   Axillary Nodes:  no R  axillary adenopathy and no L axillary adenopathy.   Psych:  normally interactive, good eye contact, not anxious appearing, and not depressed appearing.          Medication Adherence: 09/10/2010   Adherence to medications reviewed with patient. Counseling to provide adequate adherence provided            09/10/2010   Patient was screened for depression. Referal was made as indicated.                      Impression & Recommendations:  Problem # 1:  HIV DISEASE (ICD-042) Her HIV infection remains under excellent control.  I will continue a triple for now but have reminded her that she will need to change to an alternate regimen if she decides to get pregnant.  She had problems with nausea and vomiting while taking Combivir and Kaletra in the past but I assured her that  there would be other regimens that are likely to be effective and more tolerable during pregnancy.  I will see if we can arrange for an OB visit for her and her husband to discuss options for artificial insemination.  She knows that if they have unprotected sex there is some small risk that he could become infected with HIV and that if she becomes pregnant there is a small risk that she could transmit infection to her fetus.  This risk is low as Kathleen Savage as her viral load is undetectable but I have told her it is certainly not zero. Diagnostics Reviewed:  CD4: 570 (08/07/2010)   WBC: 5.2 (08/06/2010)   Hgb: 12.4 (08/06/2010)   HCT: 36.4 (08/06/2010)   Platelets: 328 (08/06/2010) HIV-1 RNA: <20 copies/mL (08/06/2010)   HBSAg: NO (01/04/2007)  Problem # 2:  GRIEF REACTION, ACUTE (ICD-309.0) She is experiencing a mild grief reaction to the death of her father but appears to be coping as well as expected.  I've asked her to call me if she is feeling worse. Orders: Est. Patient Level IV ([address]) Obstetric Referral (Obstetric)  Other Orders: Influenza Vaccine NON MCR ([address]) Future Orders: T-CD4SP ([facility]) (CD4SP) ... 12/09/2010 T-HIV Viral Load [phone]) ... 12/09/2010 T-Comprehensive Metabolic Panel [phone]) ... 12/09/2010 T-CBC w/Diff ([id number]) ... 12/09/2010 T-RPR (Syphilis) [phone]) ... 12/09/2010 T-Lipid Profile [phone]) ... 12/09/2010  Patient Instructions: 1)  Please schedule a follow-up appointment in 3 months.         Medication Adherence: 09/10/2010   Adherence to medications reviewed with patient. Counseling to provide adequate adherence provided           09/10/2010   Patient was screened for depression. Referal was made as indicated.                      Process Orders Check Orders Results:     [facility]: ABN not required for this insurance Tests Sent for requisitioning (September 10, 2010 1:50 PM):     12/09/2010: [facility] -- T-HIV Viral Load [phone] (signed)     12/09/2010: [facility] -- T-Comprehensive Metabolic Panel [[id number]] (signed)     12/09/2010: [facility] -- T-CBC w/Diff [[id number]] (signed)     12/09/2010: [facility] -- T-RPR (Syphilis) [phone] (signed)     12/09/2010: [facility] -- T-Lipid Profile [phone] (signed)     Influenza Vaccine    Vaccine Type: Fluvax Non-MCR    Site: left deltoid    Mfr: novartis  Dose: 0.5 ml    Route: IM    Given by: Jennet Maduro RN    Exp. Date: 02/09/2011    Lot #: [id number]  Flu Vaccine Consent Questions    Do you have a history of severe allergic reactions to this vaccine? no    Any prior history of allergic reactions to egg and/or gelatin? no    Do you have a sensitivity to the preservative Thimersol? no    Do you have a past history of Guillan-Barre Syndrome? no    Do you currently have an acute febrile illness? no    Have you ever had a severe reaction to latex? no    Vaccine information given and explained to patient? yes    Are you currently pregnant? no

## 2010-12-10 NOTE — Miscellaneous (Signed)
Summary: clinical update/ryan white [facility] appr til 02/08/11  Clinical Lists Changes  Observations: Added new observation of AIDSDAP: Yes 2011 (01/30/2010 8:43)

## 2010-12-10 NOTE — Progress Notes (Signed)
Summary: New [place]AP/pt assist meda arrived forApr  Phone Note Refill Request      Prescriptions: ATRIPLA 600-200-300 MG TABS (EFAVIRENZ-EMTRICITAB-TENOFOVIR) Take 1 tablet by mouth once a day  #30 x 0   Entered by:   Paulo Fruit  BS,CPht II,MPH   Authorized by:   Cliffton Asters MD   Signed by:   Paulo Fruit  BS,CPht II,MPH on 02/27/2010   Method used:   Samples Given   RxID:   [id number]   Patient Assist Medication Verification: Medication: Atripla Lot# [id number] Exp Date:07 2013 Tech approval:MLD Call placed to patient with message that assistance medications are ready for pick-up. Left messag on patient's VM. Reyes Fifield  BS,CPht II,MPH  February 27, 2010 12:18 PM

## 2010-12-10 NOTE — Letter (Signed)
Summary: [facility]: Referral Form  [facility]: Referral Form   Imported By: Florinda Marker 12/27/2009 09:02:07  _____________________________________________________________________  External Attachment:    Type:   Image     Comment:   External Document

## 2010-12-10 NOTE — Assessment & Plan Note (Signed)
Summary: RX change, pt. desires preg /dde   CC:  follow-up visit.  History of Present Illness: Kathleen Savage is in today with her husband for her routine visit.  She continues to take Atripla without any difficulty and never misses doses.  She states that they have decided to attempt to get pregnant again.  They have not started trying yet but want to try as soon as possible.  Preventive Screening-Counseling & Management  Alcohol-Tobacco     Alcohol drinks/day: 0     Smoking Status: never     Passive Smoke Exposure: no  Caffeine-Diet-Exercise     Caffeine use/day: yes     Does Patient Exercise: no, weather premiting     Type of exercise: walking     Exercise (avg: min/session): 30-60     Times/week: <3  Hep-HIV-STD-Contraception     HIV Risk: no risk noted  Safety-Violence-Falls     Seat Belt Use: yes  Comments: given condoms      Sexual History:  currently monogamous.        Drug Use:  never.     Prior Medication List:  ATRIPLA 600-200-300 MG TABS (EFAVIRENZ-EMTRICITAB-TENOFOVIR) Take 1 tablet by mouth once a day   Current Allergies (reviewed today): No known allergies  Vital Signs:  Patient profile:   35 year old female Menstrual status:  regular Height:      65 inches (165.10 cm) Weight:      241.3 pounds (109.68 kg) BMI:     40.30 Temp:     97.2 degrees F (36.22 degrees C) oral Pulse rate:   69 / minute BP sitting:   116 / 69  (left arm) Cuff size:   large  Vitals Entered By: Jennet Maduro RN (December 25, 2009 11:35 AM) CC: follow-up visit Is Patient Diabetic? No Pain Assessment Patient in pain? no      Nutritional Status BMI of > 30 = obese Nutritional Status Detail appetite "good"  Have you ever been in a relationship where you felt threatened, hurt or afraid?not asked husband present   Does patient need assistance? Functional Status Self care Ambulation Normal Comments no missed doses of rxes   Physical Exam  General:  alert and  overweight-appearing.   Mouth:  pharynx pink and moist.  good dentition, no erythema, and no exudates.   Lungs:  normal breath sounds.  no crackles and no wheezes.   Heart:  normal rate, regular rhythm, and no murmur.   Psych:  normally interactive, good eye contact, not anxious appearing, and not depressed appearing.      Impression & Recommendations:  Problem # 1:  HIV DISEASE (ICD-042) I will change a Atripla  to Combivir and Kaletra since the Sustiva component of a Atripla  is contraindicated in pregnancy.  She tolerated this regimen during her previous pregnancy.  If she does become pregnant I will increase the Kaletra to 3 tablets daily as recommended during pregnancy.  I have discussed the risks of vertical transmission with him again.  Her viral load has been low and stable and I estimate the risk in her case to be less than 1% assuming that her viral load remains low and stable.  Of course, there is also the risk to her husband but they are considering artificial insemination.  I will make a referral to the high risk obstetrics clinic at [facility] for further counseling about their options. Diagnostics Reviewed:  CD4: 610 (10/10/2009)   WBC: 5.4 (10/09/2009)   Hgb: 13.2 (  10/09/2009)   HCT: 39.6 (10/09/2009)   Platelets: 302 (10/09/2009) HIV-1 RNA: 79 (10/09/2009)   HBSAg: NO (01/04/2007)  Orders: Obstetric Referral (Obstetric)  Medications Added to Medication List This Visit: 1)  Combivir 150-300 Mg Tabs (Lamivudine-zidovudine) .... Take 1 tablet by mouth two times a day 2)  Kaletra 200-50 Mg Tabs (Lopinavir-ritonavir) .... Take 2 tablets by mouth two times a day  Other Orders: Est. Patient Level III ([address]40)  Patient Instructions: 1)  Please schedule a follow-up appointment in 2 months.  Prescriptions: KALETRA 200-50 MG TABS (LOPINAVIR-RITONAVIR) Take 2 tablets by mouth two times a day  #120 x 11   Entered and Authorized by:   Cliffton Asters MD   Signed by:   Cliffton Asters MD on 12/25/2009   Method used:   Print then Give to Patient   RxID:   [id number] COMBIVIR 150-300 MG TABS (LAMIVUDINE-ZIDOVUDINE) Take 1 tablet by mouth two times a day  #60 x 11   Entered and Authorized by:   Cliffton Asters MD   Signed by:   Cliffton Asters MD on 12/25/2009   Method used:   Print then Give to Patient   RxID:   [id number]     Influenza Immunization History:    Influenza # 1:  Historical (10/23/2009)

## 2010-12-10 NOTE — Progress Notes (Signed)
Summary: [facility]/pt assist meds arrived for Feb (new meds)  Phone Note Refill Request      Prescriptions: KALETRA 200-50 MG TABS (LOPINAVIR-RITONAVIR) Take 2 tablets by mouth two times a day  #120 x 0   Entered by:   Paulo Fruit  BS,CPht II,MPH   Authorized by:   Cliffton Asters MD   Signed by:   Paulo Fruit  BS,CPht II,MPH on 12/27/2009   Method used:   Samples Given   RxID:   [id number] COMBIVIR 150-300 MG TABS (LAMIVUDINE-ZIDOVUDINE) Take 1 tablet by mouth two times a day  #60 x 0   Entered by:   Paulo Fruit  BS,CPht II,MPH   Authorized by:   Cliffton Asters MD   Signed by:   Paulo Fruit  BS,CPht II,MPH on 12/27/2009   Method used:   Samples Given   RxID:   [id number]   Patient Assist Medication Verification: Medication: Combivir 150/300mg  QIO#[id number] Exp Date:Oct 2014 Tech approval:MLD                Patient Assist Medication Verification: Medication:Kaletra 200/50mg  UXL#[id number] Exp Date:06 Sep 2012 Tech approval:MLD               Call placed to patient with message that assistance medications are ready for pick-up. Left message on VM. Taniesha Glanz  BS,CPht II,MPH  December 27, 2009 11:42 AM

## 2010-12-10 NOTE — Progress Notes (Signed)
Summary: [facility]/pt assist med arrived for Jul  Phone Note Refill Request      Prescriptions: ATRIPLA 600-200-300 MG TABS (EFAVIRENZ-EMTRICITAB-TENOFOVIR) Take 1 tablet by mouth once a day  #30 x 0   Entered by:   Paulo Fruit  BS,CPht II,MPH   Authorized by:   Cliffton Asters MD   Signed by:   Paulo Fruit  BS,CPht II,MPH on 05/14/2010   Method used:   Samples Given   RxID:   [id number]  Patient Assist Medication Verification: Medication name: Atripla RX # [id number] Tech approval:MLD Tried to contact patient.  Number would not connect at the time call was placed. Patient may have travelled outside of the connected zone or have phone turned off Cheyanna Strick  BS,CPht II,MPH  May 14, 2010 10:08 AM

## 2010-12-10 NOTE — Progress Notes (Signed)
Summary: [facility]/pt assist meds arrive for July  Phone Note Refill Request      Prescriptions: ATRIPLA 600-200-300 MG TABS (EFAVIRENZ-EMTRICITAB-TENOFOVIR) Take 1 tablet by mouth once a day  #30 x 0   Entered by:   Paulo Fruit  BS,CPht II,MPH   Authorized by:   Cliffton Asters MD   Signed by:   Paulo Fruit  BS,CPht II,MPH on 06/07/2010   Method used:   Samples Given   RxID:   [id number]  Patient Assist Medication Verification: Medication name: Atripla RX # [id number] Tech approval:MLD  Tried to contact patient.  Was unable to reach patient at the time call was placed. The message on phone stated that it was off  or out-of the network area to accept call. Storey Stangeland  BS,CPht II,MPH  June 07, 2010 1:54 PM

## 2010-12-10 NOTE — Letter (Signed)
Summary: Generic Letter  [facility]  [address]   [place], [place] [address]   Phone: [phone]  Fax: [phone]         December 26, 2009  SHANDI GODFREY [address] [facility] [address] [place], [place]  [address]  Dear Ms. PEREZ-FUERTE,  Your appointment at [facility] has been made.  I encourage you to bring your husband to this appointment.  Here is the appointment information:    Date:    Wednesday, March 06, 2010   Time:     1:15,  please be there 15 minutes early to complete paperwork   Place:     [facility]       [address]       [place], [place]   Tel. #     [phone]  Please bring the following to your appointment:  *  Juanell Fairly Card with [facility] information  *  A picture ID  *  All medications you are currently taking   If you need to change this appointment please call the [facility] directly at least 48 hours in advance of  your appointment time.  Thank you.  Sincerely,   Jennet Maduro RN [facility]

## 2010-12-10 NOTE — Assessment & Plan Note (Signed)
Summary: F/U/VS   CC:  follow-up visit, after changing rxes in order to become pregnant I have been having problems with my stomach, nausea, gas, diarrhea, and pain.  History of Present Illness: Kathleen Savage is seen today he only work in visit.  Since switching to Combivir and Kaletra two months ago she has been bothered by some cramps and diarrhea.  She estimates that she's missed two to 3 doses of her medication, each time in the morning when she got busy and forgot.  Her young son has been sick with respiratory problems.  The cause of the stress this has added she and her husband have decided not to try to become pregnant again anytime soon.  She wants to switch back to Atripla.  Preventive Screening-Counseling & Management  Alcohol-Tobacco     Alcohol drinks/day: 0     Smoking Status: never     Passive Smoke Exposure: no  Caffeine-Diet-Exercise     Caffeine use/day: yes     Does Patient Exercise: no, weather premiting     Type of exercise: walking     Exercise (avg: min/session): 30-60     Times/week: <3  Hep-HIV-STD-Contraception     HIV Risk: no risk noted  Safety-Violence-Falls     Seat Belt Use: yes  Comments: given condoms      Sexual History:  currently monogamous.        Drug Use:  never.     Current Allergies (reviewed today): No known allergies  Vital Signs:  Patient profile:   35 year old female Menstrual status:  regular LMP:     02/01/2010 Height:      65 inches (165.10 cm) Weight:      235.75 pounds (107.16 kg) BMI:     39.37 Temp:     98.6 degrees F (37.00 degrees C) 0 Pulse rate:   84 / minute BP sitting:   113 / 75  (right arm) Cuff size:   large  Vitals Entered By: Jennet Maduro RN (February 25, 2010 2:32 PM) CC: follow-up visit, after changing rxes in order to become pregnant I have been having problems with my stomach, nausea, gas, diarrhea, pain Is Patient Diabetic? No Pain Assessment Patient in pain? no      Nutritional Status BMI  of > 30 = obese Nutritional Status Detail appetite "OK"  Have you ever been in a relationship where you felt threatened, hurt or afraid?not asked husband present   Does patient need assistance? Functional Status Self care Ambulation Normal Comments misses pills frequently LMP (date): 02/01/2010 LMP - Character: normal     Enter LMP: 02/01/2010 Last PAP Result NEGATIVE FOR INTRAEPITHELIAL LESIONS OR MALIGNANCY.   Physical Exam  General:  alert and overweight-appearing.   Mouth:  pharynx pink and moist.  good dentition, no erythema, and no exudates.   Lungs:  normal breath sounds.  no crackles and no wheezes.   Heart:  normal rate, regular rhythm, and no murmur.   Abdomen:  soft, non-tender, and normal bowel sounds.      Impression & Recommendations:  Problem # 1:  HIV DISEASE (ICD-042) She is probably intolerant of Kaletra and she is having difficulty with the b.i.d. schedule of her current regimen.  I let her know that I could switch her to an alternate once daily regimen other than Atripla and allow her to still consider getting pregnant.  However she and her husband are adamant that they had no desire or plan to become pregnant any  time soon and she wants to go back to Atripla which she tolerated very well and found very easy to take.  I have asked her to call me immediately if she changes her mind about pregnancy or thinks she might be pregnant. Diagnostics Reviewed:  CD4: 610 (10/10/2009)   WBC: 5.4 (10/09/2009)   Hgb: 13.2 (10/09/2009)   HCT: 39.6 (10/09/2009)   Platelets: 302 (10/09/2009) HIV-1 RNA: 79 (10/09/2009)   HBSAg: NO (01/04/2007)  Medications Added to Medication List This Visit: 1)  Atripla 600-200-300 Mg Tabs (Efavirenz-emtricitab-tenofovir) .... Take 1 tablet by mouth once a day  Other Orders: Est. Patient Level III ([address])  Patient Instructions: 1)  Keep previously scheduled appointment for blood work and a visit in June.  Prescriptions: ATRIPLA  600-200-300 MG TABS (EFAVIRENZ-EMTRICITAB-TENOFOVIR) Take 1 tablet by mouth once a day  #30 x 11   Entered and Authorized by:   Cliffton Asters MD   Signed by:   Cliffton Asters MD on 02/25/2010   Method used:   Print then Give to Patient   RxID:   [id number]

## 2010-12-10 NOTE — Assessment & Plan Note (Signed)
Summary: PAP SMEAR/VS   Vital Signs:  Patient profile:   35 year old female Menstrual status:  regular LMP:     05/14/2010  Vitals Entered By: Jennet Maduro RN (May 31, 2010 9:12 AM) CC: PAP smear visit.  Pt. given condoms.  Pt. given eduational materials re:  HIV and women, diet, nutrition, BSE and self-esteem LMP (date): 05/14/2010 LMP - Character: normal     Menstrual Status regular Enter LMP: 05/14/2010 Last PAP Result NEGATIVE FOR INTRAEPITHELIAL LESIONS OR MALIGNANCY.   Patient Instructions: 1)  Your results will be ready in about a week.  I will mail you a letter. 2)  Dr. Orvan Falconer wanted to to attend the Health Lifestyle Classes at the [facility].  You will need an interpreter for this class. 3)  Thank you for coming to the Center today. 4)  Please schedule a follow-up appointment in 1 year.  Prior Medications: ATRIPLA 600-200-300 MG TABS (EFAVIRENZ-EMTRICITAB-TENOFOVIR) Take 1 tablet by mouth once a day Current Allergies: No known allergies  Orders Added: 1)  T-PAP [facility]) [88142] 2)  Est. Patient Level I [[address]]

## 2010-12-10 NOTE — Progress Notes (Signed)
Summary: pt. notified ADAP meds arrived  Phone Note Outgoing Call   Call placed by: Annice Pih Summary of Call: Pt. notified that ADAP med. for October, Atripla ready for pick up Initial call taken by: Wendall Mola CMA Duncan Dull),  September 02, 2010 12:23 PM     Appended Document: pt. notified ADAP meds arrived pt. picked up ADAP meds

## 2010-12-10 NOTE — Progress Notes (Signed)
Summary: NCADAP/pt assist med arrived for Jun via [facility]  Phone Note Refill Request      Prescriptions: ATRIPLA 600-200-300 MG TABS (EFAVIRENZ-EMTRICITAB-TENOFOVIR) Take 1 tablet by mouth once a day  #30 x 0   Entered by:   Paulo Fruit  BS,CPht II,MPH   Authorized by:   Cliffton Asters MD   Signed by:   Paulo Fruit  BS,CPht II,MPH on 04/23/2010   Method used:   Samples Given   RxID:   [id number]  Patient Assist Medication Verification: Medication name: Atripla RX # [id number] Tech approval:MLD Call placed to patient with message that assistance medications are ready for pick-up. Yanique Mulvihill  BS,CPht II,MPH  April 23, 2010 10:16 AM

## 2010-12-10 NOTE — Miscellaneous (Signed)
Summary: clinical update/ryan white   Clinical Lists Changes  Observations: Added new observation of PCTFPL: 140.55  (02/26/2010 15:50) Added new observation of HOUSEINCOME: 95621  (02/26/2010 15:50) Added new observation of FINASSESSDT: 02/26/2010  (02/26/2010 15:50) Added new observation of YEARLYEXPEN: 1550  (02/26/2010 15:50)

## 2010-12-10 NOTE — Progress Notes (Signed)
Summary: Pt. desires pregnancy, requesting rx change/ new appt.  Phone Note Call from Patient Call back at Home Phone [phone]   Caller: Patient Call For: Cliffton Asters MD Reason for Call: Talk to Nurse Action Taken: Phone Call Completed, Appt Scheduled Summary of Call: Pt. interested in becoming pregnant.  Wondering about the current medication she is on and whether she should be taking it when considering pregnancy.  RN called pt. back to reschedule pt. from March appt. to February.  Appt. rescheduled. Jennet Maduro RN  December 12, 2009 2:32 PM

## 2010-12-24 ENCOUNTER — Ambulatory Visit (INDEPENDENT_AMBULATORY_CARE_PROVIDER_SITE_OTHER): Payer: Self-pay | Admitting: Internal Medicine

## 2010-12-24 ENCOUNTER — Encounter: Payer: Self-pay | Admitting: Internal Medicine

## 2010-12-24 DIAGNOSIS — B20 Human immunodeficiency virus [HIV] disease: Secondary | ICD-10-CM

## 2011-01-01 NOTE — Assessment & Plan Note (Signed)
Summary: 46month f/u   Vital Signs:  Patient profile:   35 year old female Menstrual status:  regular Height:      65 inches (165.10 cm) Weight:      243.5 pounds (110.68 kg) BMI:     40.67 Temp:     98.0 degrees F (36.67 degrees C) oral Pulse rate:   73 / minute BP sitting:   97 / 68  (left arm) Cuff size:   large  Vitals Entered By: Jennet Maduro RN (December 24, 2010 9:21 AM) CC: follow-up visit,  Is Patient Diabetic? No Pain Assessment Patient in pain? no      Nutritional Status BMI of > 30 = obese Nutritional Status Detail appetite "very good"  Have you ever been in a relationship where you felt threatened, hurt or afraid?No   Does patient need assistance? Functional Status Self care Ambulation Normal Comments missed one dose of her rx   CC:  follow-up visit and .  History of Present Illness: Kathleen Savage is in for her routine visit today.  She has missed only one dose of her Atripla since her last visit.  That occurred when she fell asleep before taking it.  She and her husband have decided that they want to have another child.  She would like a referral back to the [facility] where she was seen during her last pregnancy.  Her husband is still using condoms and they have not tried to become pregnant yet.  Preventive Screening-Counseling & Management  Alcohol-Tobacco     Alcohol drinks/day: 0     Smoking Status: never     Passive Smoke Exposure: no  Caffeine-Diet-Exercise     Caffeine use/day: yes     Does Patient Exercise: no, not walking fast     Type of exercise: walking     Exercise (avg: min/session): 30-60     Times/week: <3  Hep-HIV-STD-Contraception     HIV Risk: no risk noted     HIV Risk Counseling: not indicated-no HIV risk noted  Safety-Violence-Falls     Seat Belt Use: yes  Comments: given condoms      Sexual History:  currently monogamous.        Drug Use:  never.    Current Medications (verified): 1)   Atripla 600-200-300 Mg Tabs (Efavirenz-Emtricitab-Tenofovir) .... Take 1 Tablet By Mouth Once A Day  Allergies (verified): No Known Drug Allergies  Physical Exam  General:  alert and overweight-appearing.   Mouth:  pharynx pink and moist.  good dentition, no erythema, and no exudates.   Lungs:  normal breath sounds.  no crackles and no wheezes.   Heart:  normal rate, regular rhythm, and no murmur.   Skin:  no rashes.   Axillary Nodes:  no R axillary adenopathy and no L axillary adenopathy.   Psych:  normally interactive, good eye contact, not anxious appearing, and not depressed appearing.          Medication Adherence: 12/24/2010   Adherence to medications reviewed with patient. Counseling to provide adequate adherence provided   Prevention For Positives: 12/24/2010   Safe sex practices discussed with patient. Condoms offered.                             Impression & Recommendations:  Problem # 1:  HIV DISEASE (ICD-042) Her infection remains under very good control.  Because of her ongoing desire to become pregnant I will  go ahead and take her off of Atripla which is contraindicated in pregnancy and start her on Combivir, Reyataz and Norvir.  Diagnostics Reviewed:  HIV: CDC-defined AIDS (09/10/2010)   CD4: 690 (12/06/2010)   WBC: 5.9 (12/05/2010)   Hgb: 13.6 (12/05/2010)   HCT: 40.5 (12/05/2010)   Platelets: 312 (12/05/2010) HIV-1 RNA: <20 copies/mL (12/05/2010)   HBSAg: NO (01/04/2007)  Medications Added to Medication List This Visit: 1)  Combivir 150-300 Mg Tabs (Lamivudine-zidovudine) .... Take 1 tablet by mouth two times a day 2)  Reyataz 300 Mg Caps (Atazanavir sulfate) .... Take 1 tablet by mouth once a day 3)  Norvir 100 Mg Tabs (Ritonavir) .... Take 1 tablet by mouth once a day  Other Orders: Est. Patient Level III (29562) Future Orders: T-CD4SP ([facility]) (CD4SP) ... 03/24/2011 T-HIV Viral Load [phone]) ... 03/24/2011  Patient Instructions: 1)   Please schedule a follow-up appointment in 3 months.  Prescriptions: NORVIR 100 MG TABS (RITONAVIR) Take 1 tablet by mouth once a day  #30 x 11   Entered and Authorized by:   Cliffton Asters MD   Signed by:   Cliffton Asters MD on 12/24/2010   Method used:   Print then Give to Patient   RxID:   [id number] REYATAZ 300 MG CAPS (ATAZANAVIR SULFATE) Take 1 tablet by mouth once a day  #30 x 11   Entered and Authorized by:   Cliffton Asters MD   Signed by:   Cliffton Asters MD on 12/24/2010   Method used:   Print then Give to Patient   RxID:   [id number] COMBIVIR 150-300 MG TABS (LAMIVUDINE-ZIDOVUDINE) Take 1 tablet by mouth two times a day  #60 x 11   Entered and Authorized by:   Cliffton Asters MD   Signed by:   Cliffton Asters MD on 12/24/2010   Method used:   Print then Give to Patient   RxID:   [id number]    Orders Added: 1)  T-CD4SP ([facility]) [CD4SP] 2)  T-HIV Viral Load [[id number]] 3)  Est. Patient Level III [60109]          Medication Adherence: 12/24/2010   Adherence to medications reviewed with patient. Counseling to provide adequate adherence provided    Prevention For Positives: 12/24/2010   Safe sex practices discussed with patient. Condoms offered.

## 2011-01-23 ENCOUNTER — Encounter: Payer: Self-pay | Admitting: Internal Medicine

## 2011-01-23 LAB — T-HELPER CELL (CD4) - (RCID CLINIC ONLY): CD4 % Helper T Cell: 33 % (ref 33–55)

## 2011-01-27 LAB — T-HELPER CELL (CD4) - (RCID CLINIC ONLY)
CD4 % Helper T Cell: 32 % — ABNORMAL LOW (ref 33–55)
CD4 T Cell Abs: 560 uL (ref 400–2700)

## 2011-02-12 LAB — T-HELPER CELL (CD4) - (RCID CLINIC ONLY)
CD4 % Helper T Cell: 33 % (ref 33–55)
CD4 T Cell Abs: 610 uL (ref 400–2700)

## 2011-03-11 ENCOUNTER — Other Ambulatory Visit: Payer: Self-pay

## 2011-03-25 ENCOUNTER — Ambulatory Visit: Payer: Self-pay | Admitting: Internal Medicine

## 2011-04-01 ENCOUNTER — Other Ambulatory Visit: Payer: Self-pay

## 2011-04-08 ENCOUNTER — Other Ambulatory Visit (INDEPENDENT_AMBULATORY_CARE_PROVIDER_SITE_OTHER): Payer: Self-pay

## 2011-04-08 DIAGNOSIS — B20 Human immunodeficiency virus [HIV] disease: Secondary | ICD-10-CM

## 2011-04-09 LAB — T-HELPER CELL (CD4) - (RCID CLINIC ONLY): CD4 % Helper T Cell: 35 % (ref 33–55)

## 2011-04-15 ENCOUNTER — Encounter: Payer: Self-pay | Admitting: Internal Medicine

## 2011-04-15 ENCOUNTER — Ambulatory Visit (INDEPENDENT_AMBULATORY_CARE_PROVIDER_SITE_OTHER): Payer: Self-pay | Admitting: Internal Medicine

## 2011-04-15 VITALS — BP 111/74 | HR 73 | Temp 97.7°F | Ht 63.0 in | Wt 240.0 lb

## 2011-04-15 DIAGNOSIS — B20 Human immunodeficiency virus [HIV] disease: Secondary | ICD-10-CM

## 2011-04-15 NOTE — Assessment & Plan Note (Signed)
Her HIV infection remains under very good control and she is tolerating her new regimen. She her husband are still grappling with the issue of a second pregnancy and, although still using condoms on a regular basis, are still wanting to become pregnant. I will continue her current regimen.

## 2011-04-15 NOTE — Progress Notes (Signed)
  Subjective:    Patient ID: Liam Rogers, female    DOB: [birth year], 35 y.o.   MRN: [id number]  HPI [name] is in today for her routine visit with the interpreter. She did make the switch to her Reyataz based regimen after her last visit in anticipation that she might become pregnant. She has not had any problems tolerating her new regimen and recalls missing only 2 doses of her morning Combivir since starting it. Both times that occurred when she got busy and left for work before taking it. She has not been able to afford seen the obstetrician. She states that her husband's condom broke recently while they were having intercourse and he is planning on being retested for HIV at the health department. He has been HIV negative in the past. They are still considering another pregnancy.    Review of Systems     Objective:   Physical Exam  Constitutional: No distress.  HENT:  Mouth/Throat: Oropharynx is clear and moist. No oropharyngeal exudate.  Cardiovascular: Normal rate, regular rhythm and normal heart sounds.   No murmur heard. Pulmonary/Chest: She has no wheezes. She has no rales.  Psychiatric: She has a normal mood and affect.          Assessment & Plan:

## 2011-05-23 ENCOUNTER — Ambulatory Visit (INDEPENDENT_AMBULATORY_CARE_PROVIDER_SITE_OTHER): Payer: Self-pay | Admitting: Internal Medicine

## 2011-05-23 DIAGNOSIS — Z124 Encounter for screening for malignant neoplasm of cervix: Secondary | ICD-10-CM

## 2011-05-23 NOTE — Patient Instructions (Signed)
  Your results will be ready in about a week.  I will mail them to you.  Thank you for coming to the Center for your care.  Jennet Maduro, RN.

## 2011-05-23 NOTE — Progress Notes (Signed)
  Subjective:     Kathleen Savage is a 35 y.o. woman who comes in today for a  pap smear only. Her most recent Pap smear was one year ago and showed no abnormalities. Previous abnormal Pap smears: no. Contraception: condoms.  Review of Systems  Pt. C/O occasional urine leakage. Objective:    There were no vitals taken for this visit. Pelvic Exam: Pap smear obtained.  Slight bleeding following specimen.    Assessment:    Screening pap smear.     Plan:  Pt. Instructed in use of Keger exercises to strengthen pelvic floor.  Pt given educational materials re:  BSE, nutrition, PAP smears and self-esteem in Spanish.   Follow up in 1 year, or as indicated by Pap results.

## 2011-06-03 ENCOUNTER — Encounter: Payer: Self-pay | Admitting: *Deleted

## 2011-07-30 ENCOUNTER — Telehealth: Payer: Self-pay | Admitting: *Deleted

## 2011-07-30 DIAGNOSIS — B20 Human immunodeficiency virus [HIV] disease: Secondary | ICD-10-CM

## 2011-07-30 MED ORDER — ATAZANAVIR SULFATE 300 MG PO CAPS: 300.0000 mg | ORAL_CAPSULE | Freq: Every day | ORAL | Status: DC

## 2011-07-30 MED ORDER — LAMIVUDINE-ZIDOVUDINE 150-300 MG PO TABS: 1.0000 | ORAL_TABLET | Freq: Two times a day (BID) | ORAL | Status: DC

## 2011-07-30 MED ORDER — RITONAVIR 100 MG PO CAPS: 100.0000 mg | ORAL_CAPSULE | Freq: Every day | ORAL | Status: DC

## 2011-07-30 NOTE — Telephone Encounter (Signed)
I refilled her meds. States she misses them sometimes. Has been feverish for about 10 days. Poor appetite & poor sleep. Transferred to front to make an appt

## 2011-07-30 NOTE — Telephone Encounter (Signed)
rec'd a message from pt. I think she wanted an appt & refills on meds. I LM for her to call back. She had left me the number [phone]

## 2011-08-01 LAB — T-HELPER CELL (CD4) - (RCID CLINIC ONLY)
CD4 % Helper T Cell: 31 — ABNORMAL LOW
CD4 T Cell Abs: 520

## 2011-08-04 ENCOUNTER — Ambulatory Visit (INDEPENDENT_AMBULATORY_CARE_PROVIDER_SITE_OTHER): Payer: Self-pay | Admitting: Infectious Disease

## 2011-08-04 ENCOUNTER — Encounter: Payer: Self-pay | Admitting: Infectious Disease

## 2011-08-04 ENCOUNTER — Ambulatory Visit: Payer: Self-pay

## 2011-08-04 VITALS — BP 99/67 | HR 73 | Temp 98.1°F | Wt 233.8 lb

## 2011-08-04 DIAGNOSIS — Z23 Encounter for immunization: Secondary | ICD-10-CM

## 2011-08-04 DIAGNOSIS — B20 Human immunodeficiency virus [HIV] disease: Secondary | ICD-10-CM

## 2011-08-04 DIAGNOSIS — R87619 Unspecified abnormal cytological findings in specimens from cervix uteri: Secondary | ICD-10-CM

## 2011-08-04 DIAGNOSIS — Z113 Encounter for screening for infections with a predominantly sexual mode of transmission: Secondary | ICD-10-CM

## 2011-08-04 DIAGNOSIS — R509 Fever, unspecified: Secondary | ICD-10-CM | POA: Insufficient documentation

## 2011-08-04 LAB — COMPREHENSIVE METABOLIC PANEL
ALT: 92 U/L — ABNORMAL HIGH (ref 0–35)
Alkaline Phosphatase: 44 U/L (ref 39–117)
CO2: 22 mEq/L (ref 19–32)
Creat: 0.6 mg/dL (ref 0.50–1.10)
Sodium: 138 mEq/L (ref 135–145)
Total Bilirubin: 0.6 mg/dL (ref 0.3–1.2)

## 2011-08-04 MED ORDER — RITONAVIR 100 MG PO CAPS: 100.0000 mg | ORAL_CAPSULE | Freq: Every day | ORAL | Status: DC

## 2011-08-04 MED ORDER — ATAZANAVIR SULFATE 300 MG PO CAPS: 300.0000 mg | ORAL_CAPSULE | Freq: Every day | ORAL | Status: DC

## 2011-08-04 MED ORDER — LAMIVUDINE-ZIDOVUDINE 150-300 MG PO TABS: 1.0000 | ORAL_TABLET | Freq: Two times a day (BID) | ORAL | Status: DC

## 2011-08-04 NOTE — Progress Notes (Signed)
  Subjective:    Patient ID: Kathleen Savage, female    DOB: [birth year], 35 y.o.   MRN: [id number]  HPI  35 year old Hispanic lady with HIV well controlled on reyataz boosted with norvir and with bid combivir, changed from [place] due to her desires to conceive another son with her HIV negative husband. She presents today for clinic based on two concerns,  #1 10 days prior to admission she had fever and diffuse myalgias that then resolved within two days.  #2 She had missed two doses of her reyataz, norvir and combivir and was concerned that  "her numbers might have dropped."  Review of Systems  Constitutional: Positive for fever. Negative for chills, diaphoresis, activity change, appetite change, fatigue and unexpected weight change.  HENT: Negative for congestion, sore throat, rhinorrhea, sneezing, trouble swallowing and sinus pressure.   Eyes: Negative for photophobia and visual disturbance.  Respiratory: Negative for cough, chest tightness, shortness of breath, wheezing and stridor.   Cardiovascular: Negative for chest pain, palpitations and leg swelling.  Gastrointestinal: Negative for nausea, vomiting, abdominal pain, diarrhea, constipation, blood in stool, abdominal distention and anal bleeding.  Genitourinary: Negative for dysuria, hematuria, flank pain and difficulty urinating.  Musculoskeletal: Positive for myalgias. Negative for back pain, joint swelling, arthralgias and gait problem.  Skin: Negative for color change, pallor, rash and wound.  Neurological: Negative for dizziness, tremors, weakness and light-headedness.  Hematological: Negative for adenopathy. Does not bruise/bleed easily.  Psychiatric/Behavioral: Negative for behavioral problems, confusion, sleep disturbance, dysphoric mood, decreased concentration and agitation.       Objective:   Physical Exam  Constitutional: She is oriented to person, place, and time. She appears well-developed and well-nourished. No  distress.  HENT:  Head: Normocephalic and atraumatic.  Mouth/Throat: Oropharynx is clear and moist. No oropharyngeal exudate.  Eyes: Conjunctivae and EOM are normal. Pupils are equal, round, and reactive to light. No scleral icterus.  Neck: Normal range of motion. Neck supple. No JVD present.  Cardiovascular: Normal rate, regular rhythm and normal heart sounds.  Exam reveals no gallop and no friction rub.   No murmur heard. Pulmonary/Chest: Effort normal and breath sounds normal. No respiratory distress. She has no wheezes. She has no rales. She exhibits no tenderness.  Abdominal: She exhibits no distension. There is no tenderness.  Musculoskeletal: She exhibits no edema and no tenderness.  Lymphadenopathy:    She has no cervical adenopathy.  Neurological: She is alert and oriented to person, place, and time. She has normal reflexes. She exhibits normal muscle tone. Coordination normal.  Skin: Skin is warm and dry. She is not diaphoretic. No erythema. No pallor.  Psychiatric: She has a normal mood and affect. Her behavior is normal. Judgment and thought content normal.          Assessment & Plan:

## 2011-08-04 NOTE — Assessment & Plan Note (Signed)
Resolved. ? URI

## 2011-08-04 NOTE — Assessment & Plan Note (Signed)
Needs plug in with ob gyn

## 2011-08-04 NOTE — Assessment & Plan Note (Addendum)
I raised idea of change to reyataz, norvir and truvada for dosing convenience though obviously combivir has been better studied in pregnancy. Complera would be reasonable option as well provided she could comply with the avoidance of antacids, PPI and with the food requirement. She has already been taking antacids with reyataz not understanding that acid suppression can interfere with absorption of this ARV. Again complera has not specifically been studied in pregnancy. Will defer all of this to Dr. Orvan Falconer and check viral lload and cd4 today

## 2011-08-05 LAB — CBC WITH DIFFERENTIAL/PLATELET
Basophils Absolute: 0.1 10*3/uL (ref 0.0–0.1)
Basophils Relative: 1 % (ref 0–1)
Eosinophils Absolute: 0.1 10*3/uL (ref 0.0–0.7)
Eosinophils Relative: 2 % (ref 0–5)
MCH: 35 pg — ABNORMAL HIGH (ref 26.0–34.0)
MCHC: 33.8 g/dL (ref 30.0–36.0)
MCV: 103.6 fL — ABNORMAL HIGH (ref 78.0–100.0)
Neutrophils Relative %: 58 % (ref 43–77)
Platelets: 530 10*3/uL — ABNORMAL HIGH (ref 150–400)
RDW: 13.2 % (ref 11.5–15.5)

## 2011-08-06 LAB — GC/CHLAMYDIA PROBE AMP, URINE: GC Probe Amp, Urine: NEGATIVE

## 2011-08-06 LAB — HIV-1 RNA QUANT-NO REFLEX-BLD: HIV 1 RNA Quant: <20 copies/mL (ref <20)

## 2011-08-13 LAB — T-HELPER CELL (CD4) - (RCID CLINIC ONLY)
CD4 % Helper T Cell: 31 — ABNORMAL LOW
CD4 T Cell Abs: 480

## 2011-08-15 LAB — T-HELPER CELL (CD4) - (RCID CLINIC ONLY): CD4 T Cell Abs: 600 uL (ref 400–2700)

## 2011-08-20 LAB — T-HELPER CELL (CD4) - (RCID CLINIC ONLY): CD4 T Cell Abs: 490

## 2011-10-25 ENCOUNTER — Other Ambulatory Visit: Payer: Self-pay | Admitting: Internal Medicine

## 2012-01-02 ENCOUNTER — Ambulatory Visit: Payer: Self-pay

## 2012-01-02 ENCOUNTER — Other Ambulatory Visit: Payer: Self-pay | Admitting: Internal Medicine

## 2012-01-06 ENCOUNTER — Other Ambulatory Visit (INDEPENDENT_AMBULATORY_CARE_PROVIDER_SITE_OTHER): Payer: Self-pay

## 2012-01-06 DIAGNOSIS — B20 Human immunodeficiency virus [HIV] disease: Secondary | ICD-10-CM

## 2012-01-06 LAB — COMPLETE METABOLIC PANEL WITH GFR
ALT: 39 U/L — ABNORMAL HIGH (ref 0–35)
AST: 24 U/L (ref 0–37)
CO2: 23 mEq/L (ref 19–32)
Chloride: 105 mEq/L (ref 96–112)
GFR, Est African American: >89 mL/min
Sodium: 137 mEq/L (ref 135–145)
Total Bilirubin: 2.2 mg/dL — ABNORMAL HIGH (ref 0.3–1.2)
Total Protein: 7.4 g/dL (ref 6.0–8.3)

## 2012-01-06 LAB — CBC WITH DIFFERENTIAL/PLATELET
Hemoglobin: 12.1 g/dL (ref 12.0–15.0)
Lymphs Abs: 1.8 10*3/uL (ref 0.7–4.0)
Monocytes Relative: 8 % (ref 3–12)
Neutro Abs: 2.7 10*3/uL (ref 1.7–7.7)
Neutrophils Relative %: 54 % (ref 43–77)
RBC: 3.28 MIL/uL — ABNORMAL LOW (ref 3.87–5.11)
WBC: 5 10*3/uL (ref 4.0–10.5)

## 2012-01-06 LAB — HIV ANTIBODY (ROUTINE TESTING W REFLEX): HIV: REACTIVE

## 2012-01-08 LAB — HIV-1 RNA QUANT-NO REFLEX-BLD
HIV 1 RNA Quant: <20 copies/mL (ref <20)
HIV-1 RNA Quant, Log: <1.3 {Log} (ref <1.30)

## 2012-01-10 LAB — HIV 1/2 CONFIRMATION: HIV-1 antibody: POSITIVE — AB

## 2012-01-20 ENCOUNTER — Encounter: Payer: Self-pay | Admitting: Internal Medicine

## 2012-01-20 ENCOUNTER — Ambulatory Visit (INDEPENDENT_AMBULATORY_CARE_PROVIDER_SITE_OTHER): Payer: Self-pay | Admitting: Internal Medicine

## 2012-01-20 DIAGNOSIS — B20 Human immunodeficiency virus [HIV] disease: Secondary | ICD-10-CM

## 2012-01-20 DIAGNOSIS — R05 Cough: Secondary | ICD-10-CM

## 2012-01-20 DIAGNOSIS — E785 Hyperlipidemia, unspecified: Secondary | ICD-10-CM

## 2012-01-20 DIAGNOSIS — E663 Overweight: Secondary | ICD-10-CM

## 2012-01-20 MED ORDER — LORATADINE 10 MG PO TABS: 10.0000 mg | ORAL_TABLET | Freq: Every day | ORAL | Status: DC

## 2012-01-20 NOTE — Progress Notes (Signed)
Addended by: Jennet Maduro D on: 01/20/2012 11:30 AM   Modules accepted: Orders

## 2012-01-20 NOTE — Progress Notes (Signed)
Patient ID: Kathleen Savage, female   DOB: 09-20-1976, 36 y.o.   MRN: [id number]  INFECTIOUS DISEASE PROGRESS NOTE    Subjective: Kathleen Savage is in for her first visit in one year with me. She denies missing any of her HIV meds. She is bothered by frequent dry cough, occasional rhinorrhea and itchy eyes. She denies acid reflux and is not on a PPI. She wants to lose weight but says she has urges to eat excessively and does not get any exercise.  Objective: Temp: 98.3 F (36.8 C) (03/12 1002) Temp src: Oral (03/12 1002) BP: 110/71 mmHg (03/12 1007) Pulse Rate: 80  (03/12 1007)  General: BMI is 33 Skin: no rash Oral: tonsils enlarged but not inflamed; no exudates Lungs: clear Cor: reg S1 and S2 without murmurs Abdomen: soft, obese and nontender   Lab Results HIV 1 RNA Quant (copies/mL)  Date Value  01/06/2012 <20   08/04/2011 <20   04/08/2011 <20      CD4 T Cell Abs (cmm)  Date Value  01/06/2012 570   08/05/2011 740   04/08/2011 710      Assessment: Her HIV remains under excellent controll.  I will refer her for the Lifestyle management class to help with weight control  I will try OTC loratadine for cough and possible seasonal allergies.    Plan: 1. Continue HIV meds 2. Lifestyle Management class 3. Loratadine 4. Return in 6 months   Cliffton Asters, MD [facility] for Infectious Diseases [facility] [phone] pager   [phone] cell 01/20/2012, 10:23 AM

## 2012-05-03 ENCOUNTER — Telehealth: Payer: Self-pay | Admitting: *Deleted

## 2012-05-03 ENCOUNTER — Encounter: Payer: Self-pay | Admitting: *Deleted

## 2012-05-03 NOTE — Telephone Encounter (Signed)
Message left for call for PAP smear appt.  Will also mail a letter in Spanish to the pt.

## 2012-06-04 ENCOUNTER — Ambulatory Visit (INDEPENDENT_AMBULATORY_CARE_PROVIDER_SITE_OTHER): Payer: Self-pay | Admitting: *Deleted

## 2012-06-04 DIAGNOSIS — Z124 Encounter for screening for malignant neoplasm of cervix: Secondary | ICD-10-CM

## 2012-06-04 NOTE — Progress Notes (Signed)
  Subjective:     Kathleen Savage is a 36 y.o. woman who comes in today for a  pap smear only.  Previous abnormal Pap smears: no. Contraception:  condoms  Objective:    There were no vitals taken for this visit. Pelvic Exam:  Pap smear obtained.   Assessment:    Screening pap smear.   Plan:    Follow up in one year, or as indicated by Pap results.  Pt given educational materials re: HIV and women, BSE, heart disease, PAP smears and self-esteem.  Pt given condoms

## 2012-06-04 NOTE — Patient Instructions (Addendum)
Your result will be ready in about a week.  I will mail them to you.  Thank you for coming to the Center for your care.  [name], RN  El resultado estar listo en aproximadamente Kathleen Savage. Voy a enviar a usted. Gracias por venir al [employer] para su cuidado. [name], RN  Place pap smear patient instructions here.

## 2012-06-08 ENCOUNTER — Encounter: Payer: Self-pay | Admitting: *Deleted

## 2012-07-01 ENCOUNTER — Other Ambulatory Visit: Payer: Self-pay | Admitting: Internal Medicine

## 2012-07-01 DIAGNOSIS — Z113 Encounter for screening for infections with a predominantly sexual mode of transmission: Secondary | ICD-10-CM

## 2012-07-13 ENCOUNTER — Other Ambulatory Visit (INDEPENDENT_AMBULATORY_CARE_PROVIDER_SITE_OTHER): Payer: Self-pay

## 2012-07-13 DIAGNOSIS — B20 Human immunodeficiency virus [HIV] disease: Secondary | ICD-10-CM

## 2012-07-13 DIAGNOSIS — Z113 Encounter for screening for infections with a predominantly sexual mode of transmission: Secondary | ICD-10-CM

## 2012-07-13 LAB — LIPID PANEL
HDL: 43 mg/dL (ref >39)
LDL Cholesterol: 136 mg/dL — ABNORMAL HIGH (ref 0–99)

## 2012-07-13 LAB — RPR

## 2012-07-15 LAB — HIV-1 RNA QUANT-NO REFLEX-BLD: HIV 1 RNA Quant: <20 copies/mL (ref <20)

## 2012-07-27 ENCOUNTER — Ambulatory Visit: Payer: Self-pay

## 2012-07-27 ENCOUNTER — Encounter: Payer: Self-pay | Admitting: Internal Medicine

## 2012-07-27 ENCOUNTER — Ambulatory Visit (INDEPENDENT_AMBULATORY_CARE_PROVIDER_SITE_OTHER): Payer: Self-pay | Admitting: Internal Medicine

## 2012-07-27 VITALS — BP 108/65 | HR 65 | Temp 98.2°F | Ht 64.0 in | Wt 229.2 lb

## 2012-07-27 DIAGNOSIS — Z23 Encounter for immunization: Secondary | ICD-10-CM

## 2012-07-27 DIAGNOSIS — B20 Human immunodeficiency virus [HIV] disease: Secondary | ICD-10-CM

## 2012-07-27 NOTE — Progress Notes (Signed)
Patient ID: Kathleen Savage, female   DOB: 1976/08/26, 36 y.o.   MRN: [id number]     [facility] for Infectious Disease  Patient Active Problem List  Diagnosis  . HIV DISEASE  . HERPES ZOSTER  . Morbid obesity  . ALOPECIA  . LOW BACK PAIN, CHRONIC  . PAP SMEAR, ABNORMAL  . Cough    Patient's Medications  New Prescriptions   No medications on file  Previous Medications   AMOXICILLIN (AMOXIL) 500 MG CAPSULE    Take 500 mg by mouth 3 (three) times daily.   COMBIVIR 150-300 MG PER TABLET    TAKE 1 TABLET BY MOUTH TWICE DAILY   NORVIR 100 MG TABS    TAKE 1 TABLET BY MOUTH EVERY DAY   REYATAZ 300 MG CAPSULE    TAKE 1 CAPSULE BY MOUTH EVERY DAY  Modified Medications   No medications on file  Discontinued Medications   LORATADINE (CLARITIN) 10 MG TABLET    Take 1 tablet (10 mg total) by mouth daily.    Subjective: Kathleen Savage is in for her routine visit. She denies any recent problems. She recalls missing only one dose of her HIV medicines since her last visit when she was tired and fell asleep before taking her evening dose. She's not had any further problems with seasonal allergies and did not need to try any over-the-counter medication for it. She is taking some amoxicillin now. She started that last week after she had 2 teeth extracted. She is feeling better.  Objective: Temp: 98.2 F (36.8 C) (09/17 1118) Temp src: Oral (09/17 1118) BP: 108/65 mmHg (09/17 1118) Pulse Rate: 65  (09/17 1118)  General: She is in good spirits Skin: No rash Lungs: Clear Cor: Regular S1 and S2 no murmurs   Lab Results HIV 1 RNA Quant (copies/mL)  Date Value  07/13/2012 <20   01/06/2012 <20   08/04/2011 <20      CD4 T Cell Abs (cmm)  Date Value  07/13/2012 620   01/06/2012 570   08/05/2011 740      Assessment: Her HIV infection remains under excellent control.  Plan: 1. Continue Combivir, Reyataz and Norvir 2. Followup after lab work in 6 months   Cliffton Asters, MD [facility]  for Infectious Disease [facility] [phone] pager   [phone] cell 07/27/2012, 11:53 AM

## 2012-08-03 ENCOUNTER — Ambulatory Visit: Payer: Self-pay

## 2012-08-12 ENCOUNTER — Telehealth: Payer: Self-pay

## 2012-08-12 NOTE — Telephone Encounter (Signed)
ADAP office called and is pending patient's application due to fact she has 2 SSNs showiing in their system -  called patient and she stated she did not have one at all - there were 2 different numbers - one on the W2 and a different one on 2012 taxes - called ADAP back to let them know and waiting for a call back on where to go from here - it may be tax id's for employment purposes -

## 2012-08-16 ENCOUNTER — Other Ambulatory Visit: Payer: Self-pay | Admitting: *Deleted

## 2012-08-16 DIAGNOSIS — B20 Human immunodeficiency virus [HIV] disease: Secondary | ICD-10-CM

## 2012-08-16 MED ORDER — LAMIVUDINE-ZIDOVUDINE 150-300 MG PO TABS: 1.0000 | ORAL_TABLET | Freq: Two times a day (BID) | ORAL | Status: DC

## 2012-08-16 MED ORDER — RITONAVIR 100 MG PO TABS: 100.0000 mg | ORAL_TABLET | Freq: Every day | ORAL | Status: DC

## 2012-08-16 MED ORDER — ATAZANAVIR SULFATE 300 MG PO CAPS: 300.0000 mg | ORAL_CAPSULE | Freq: Every day | ORAL | Status: DC

## 2012-08-25 ENCOUNTER — Telehealth: Payer: Self-pay | Admitting: Internal Medicine

## 2012-08-25 NOTE — Telephone Encounter (Signed)
Faxed and mailed applications to [facility] Patient Assistance and to [facility].  Faxed only to [facility] Patient Assistance today.

## 2012-08-26 ENCOUNTER — Telehealth: Payer: Self-pay | Admitting: Internal Medicine

## 2012-08-26 NOTE — Telephone Encounter (Signed)
Received fax from [employer] [facility] wanting additional information on Kathleen Savage.  I called back and gave the information requested.  They were questioning her ADAP status and if she has Medicaid.

## 2012-09-01 ENCOUNTER — Other Ambulatory Visit: Payer: Self-pay | Admitting: *Deleted

## 2012-09-01 DIAGNOSIS — B20 Human immunodeficiency virus [HIV] disease: Secondary | ICD-10-CM

## 2012-09-01 MED ORDER — ATAZANAVIR SULFATE 300 MG PO CAPS: 300.0000 mg | ORAL_CAPSULE | Freq: Every day | ORAL | Status: DC

## 2012-09-01 NOTE — Telephone Encounter (Signed)
Left message for pt to come to [facility] to pick up.

## 2012-09-06 ENCOUNTER — Telehealth: Payer: Self-pay | Admitting: *Deleted

## 2012-09-06 NOTE — Telephone Encounter (Signed)
Notified patient that her Norvir has arrived from patient assistance. Kathleen Savage

## 2012-09-27 ENCOUNTER — Telehealth: Payer: Self-pay | Admitting: Internal Medicine

## 2012-09-27 ENCOUNTER — Telehealth: Payer: Self-pay

## 2012-09-27 NOTE — Telephone Encounter (Signed)
Patient is now approved for ADAP as they were able to do work-around with legal aid, and - was on pt asst and will be taken off today, 09/27/12 - patient will be called regarding ADAP - should have received letter stating she was approved now - ADAP not sure how it will work for January recertification, but for now approved.

## 2012-09-27 NOTE — Telephone Encounter (Signed)
Called [employer] Patient Assistance, [facility] and [employer] Patient Assistance and cancelled her assistance.  Now has ADAP

## 2012-12-30 ENCOUNTER — Other Ambulatory Visit: Payer: Self-pay | Admitting: *Deleted

## 2012-12-30 DIAGNOSIS — B20 Human immunodeficiency virus [HIV] disease: Secondary | ICD-10-CM

## 2012-12-30 MED ORDER — ATAZANAVIR SULFATE 300 MG PO CAPS: 300.0000 mg | ORAL_CAPSULE | Freq: Every day | ORAL | Status: DC

## 2013-01-11 ENCOUNTER — Other Ambulatory Visit (INDEPENDENT_AMBULATORY_CARE_PROVIDER_SITE_OTHER): Payer: Self-pay

## 2013-01-11 DIAGNOSIS — B20 Human immunodeficiency virus [HIV] disease: Secondary | ICD-10-CM

## 2013-01-11 DIAGNOSIS — Z113 Encounter for screening for infections with a predominantly sexual mode of transmission: Secondary | ICD-10-CM

## 2013-01-11 DIAGNOSIS — Z79899 Other long term (current) drug therapy: Secondary | ICD-10-CM

## 2013-01-11 LAB — LIPID PANEL
Cholesterol: 204 mg/dL — ABNORMAL HIGH (ref 0–200)
HDL: 44 mg/dL
LDL Cholesterol: 132 mg/dL — ABNORMAL HIGH (ref 0–99)
Total CHOL/HDL Ratio: 4.6 ratio
Triglycerides: 142 mg/dL
VLDL: 28 mg/dL (ref 0–40)

## 2013-01-11 LAB — CBC
HCT: 35.8 % — ABNORMAL LOW (ref 36.0–46.0)
Hemoglobin: 12.4 g/dL (ref 12.0–15.0)
MCHC: 34.6 g/dL (ref 30.0–36.0)
MCV: 104.4 fL — ABNORMAL HIGH (ref 78.0–100.0)
RDW: 14 % (ref 11.5–15.5)

## 2013-01-11 LAB — COMPREHENSIVE METABOLIC PANEL WITH GFR
ALT: 41 U/L — ABNORMAL HIGH (ref 0–35)
AST: 22 U/L (ref 0–37)
Albumin: 3.9 g/dL (ref 3.5–5.2)
Alkaline Phosphatase: 53 U/L (ref 39–117)
BUN: 9 mg/dL (ref 6–23)
CO2: 24 meq/L (ref 19–32)
Calcium: 8.5 mg/dL (ref 8.4–10.5)
Chloride: 103 meq/L (ref 96–112)
Creat: 0.62 mg/dL (ref 0.50–1.10)
Glucose, Bld: 124 mg/dL — ABNORMAL HIGH (ref 70–99)
Potassium: 4.3 meq/L (ref 3.5–5.3)
Sodium: 135 meq/L (ref 135–145)
Total Bilirubin: 2.5 mg/dL — ABNORMAL HIGH (ref 0.3–1.2)
Total Protein: 7.2 g/dL (ref 6.0–8.3)

## 2013-01-20 ENCOUNTER — Ambulatory Visit: Payer: Self-pay

## 2013-01-25 ENCOUNTER — Ambulatory Visit (INDEPENDENT_AMBULATORY_CARE_PROVIDER_SITE_OTHER): Payer: Self-pay | Admitting: Internal Medicine

## 2013-01-25 ENCOUNTER — Encounter: Payer: Self-pay | Admitting: Internal Medicine

## 2013-01-25 VITALS — BP 116/76 | HR 72 | Temp 97.8°F | Wt 234.5 lb

## 2013-01-25 DIAGNOSIS — R739 Hyperglycemia, unspecified: Secondary | ICD-10-CM

## 2013-01-25 DIAGNOSIS — E785 Hyperlipidemia, unspecified: Secondary | ICD-10-CM

## 2013-01-25 DIAGNOSIS — D179 Benign lipomatous neoplasm, unspecified: Secondary | ICD-10-CM | POA: Insufficient documentation

## 2013-01-25 DIAGNOSIS — R7309 Other abnormal glucose: Secondary | ICD-10-CM

## 2013-01-25 DIAGNOSIS — B20 Human immunodeficiency virus [HIV] disease: Secondary | ICD-10-CM

## 2013-01-25 LAB — HEMOGLOBIN A1C
Hgb A1c MFr Bld: 5.4 % (ref <5.7)
Mean Plasma Glucose: 108 mg/dL (ref <117)

## 2013-01-25 MED ORDER — EFAVIRENZ-EMTRICITAB-TENOFOVIR 600-200-300 MG PO TABS: 1.0000 | ORAL_TABLET | Freq: Every day | ORAL | Status: DC

## 2013-01-25 NOTE — Progress Notes (Signed)
Patient ID: Kathleen Savage, female   DOB: 1976-09-20, 37 y.o.   MRN: [id number]          [facility] for Infectious Disease  Patient Active Problem List  Diagnosis  . HIV DISEASE  . HERPES ZOSTER  . Morbid obesity  . ALOPECIA  . LOW BACK PAIN, CHRONIC  . PAP SMEAR, ABNORMAL  . Dyslipidemia  . Hyperglycemia  . Multiple lipomas    Patient's Medications  New Prescriptions   EFAVIRENZ-EMTRICITABINE-TENOFOVIR (ATRIPLA) 600-200-300 MG PER TABLET    Take 1 tablet by mouth at bedtime.  Previous Medications   OMEPRAZOLE (PRILOSEC) 10 MG CAPSULE    Take 10 mg by mouth daily.  Modified Medications   No medications on file  Discontinued Medications   ATAZANAVIR (REYATAZ) 300 MG CAPSULE    Take 1 capsule (300 mg total) by mouth daily.   LAMIVUDINE-ZIDOVUDINE (COMBIVIR) 150-300 MG PER TABLET    Take 1 tablet by mouth 2 (two) times daily.   RITONAVIR (NORVIR) 100 MG TABS    Take 1 tablet (100 mg total) by mouth daily.    Subjective: [name] is in for her routine visit. Last month she was out of her Reyataz and Norvir for 4 days to to a mixup with [employer]AP. A new application has been made but she is not sure if the problem will be worked out. While she was out of her Reyataz and Norvir she continue to take her Combivir. She is to take Atripla but stopped this in 2007 when she became pregnant. She tells me that she's been having intermittent problems with acid indigestion and occasionally takes Prilosec.  Objective: Temp: 97.8 F (36.6 C) (03/18 1014) Temp src: Oral (03/18 1014) BP: 116/76 mmHg (03/18 1014) Pulse Rate: 72 (03/18 1014)  General: she is in good spirits as usual Skin: scattered lipoma Lungs: clear Cor: regular S1 and S2 no murmurs Abdomen: obese, soft and nontender  Lab Results HIV 1 RNA Quant (copies/mL)  Date Value  01/11/2013 <20   07/13/2012 <20   01/06/2012 <20      CD4 T Cell Abs (cmm)  Date Value  01/11/2013 540   07/13/2012 620   01/06/2012 570    Lab  Results  Component Value Date   CHOL 204* 01/11/2013   HDL 44 01/11/2013   LDLCALC 409* 01/11/2013   TRIG 142 01/11/2013   CHOLHDL 4.6 01/11/2013   BMET    Component Value Date/Time   NA 135 01/11/2013 1050   K 4.3 01/11/2013 1050   CL 103 01/11/2013 1050   CO2 24 01/11/2013 1050   GLUCOSE 124* 01/11/2013 1050   BUN 9 01/11/2013 1050   CREATININE 0.62 01/11/2013 1050   CREATININE 0.58 12/05/2010 2023   CALCIUM 8.5 01/11/2013 1050      Assessment: Her HIV infection remains under excellent control. I will simplify her regimen and change her back to Atripla. This will allow her to take proton pump inhibitors without the risk of drug drug interactions with Reyataz.  We will check about her ADAP recertification to see if there is anything we can do to make sure she stays on her medication.  She has elevations of her LDL cholesterol and glucose. I talked to her about lifestyle modification. Will check a hemoglobin A1c today and make a nutrition evaluation referral.  Plan: 1. Change anti-retroviral regimen back to Atripla 2. Continue Prilosec as needed 3. Check hemoglobin A1c 4. Nutrition counseling 5. Followup after lab work in 3 months  Cliffton Asters, MD [facility] for Infectious Disease [facility] [phone] pager   [phone] cell 01/25/2013, 10:38 AM

## 2013-01-25 NOTE — Addendum Note (Signed)
Addended by: Mariea Clonts D on: 01/25/2013 11:02 AM   Modules accepted: Orders

## 2013-01-25 NOTE — Progress Notes (Signed)
Nutrition appointment made for Monday, February 14, 2013 @ 4 PM with Diabetes Educator.

## 2013-02-14 ENCOUNTER — Ambulatory Visit: Payer: Self-pay

## 2013-02-14 NOTE — Progress Notes (Signed)
  Medical Nutrition Therapy:  Appt start time: 1530 end time:  1630.  Assessment:  Primary concerns today: pateint here for weight management with hyperlipidemia and hyperglycemia. She is here with an interpretor. Works in [employer] from 6:30 AM to 2 PM Monday thru Saturday. Walks infrequently after work. She does the food shopping and fixes the meals. She lives with husband and 2 young children. Has attended Zumba Classes which she enjoyed but cannot afford several classes each week.  MEDICATIONS: see list   DIETARY INTAKE:  Usual eating pattern includes  meals and 1 snacks per day.  Everyday foods include good variety of most foods.  Avoided foods include  Diet soda, .    24-hr recall:  B ( AM): on break at work; chicken burrito, wheat tortilla with lettuce, carrots, cabbage, shredded cheese, diluted sweet tea OR 1 pkg regular oatmeal with burrito Snk ( AM): none  L ( PM): leaves work at 2 PM and eats lunch at home.  Snk ( PM): none D (5-6 PM): occasionally meat, maybe eggs, beans, chicken, occasionally starch, canned vegetables, corn tortilla, regular coke Snk ( PM): unsweetened cereal or oatmeal with 2% milk Beverages: diluted sweet tea, regular soda  Usual physical activity: nothing scheduled at this time  Estimated energy needs: 1400 calories 158 g carbohydrates 105 g protein 39 g fat  Progress Towards Goal(s):  In progress.   Nutritional Diagnosis:  NI-1.5 Excessive energy intake As related to activity level.  As evidenced by BMI of 40.2.    Intervention:  Nutrition counseling and weight loss for hyperlipidemia and hyperglycemia initiated. Discussed Carb Counting as method for portion control and  benefits of increased activity. Explained difference between animal and plant sources of fat in terms of hyperlipidemia. Suggested several options for increasing her activity level.  Handouts given during visit include: Living Well with Diabetes in Spanish with it's explanation of  A1c and list of Carb Containing Foods APP for The 7 Minute Workout  Monitoring/Evaluation:  Dietary intake, exercise, and body weight prn.

## 2013-04-26 ENCOUNTER — Other Ambulatory Visit: Payer: Self-pay

## 2013-05-10 ENCOUNTER — Ambulatory Visit (INDEPENDENT_AMBULATORY_CARE_PROVIDER_SITE_OTHER): Payer: Self-pay | Admitting: Internal Medicine

## 2013-05-10 VITALS — BP 108/75 | HR 74 | Temp 98.2°F | Ht 64.0 in | Wt 238.8 lb

## 2013-05-10 DIAGNOSIS — B20 Human immunodeficiency virus [HIV] disease: Secondary | ICD-10-CM

## 2013-05-10 LAB — CBC
Platelets: 304 10*3/uL (ref 150–400)
RBC: 4.34 MIL/uL (ref 3.87–5.11)
RDW: 12.8 % (ref 11.5–15.5)
WBC: 5 10*3/uL (ref 4.0–10.5)

## 2013-05-10 LAB — COMPREHENSIVE METABOLIC PANEL
ALT: 61 U/L — ABNORMAL HIGH (ref 0–35)
AST: 34 U/L (ref 0–37)
CO2: 25 mEq/L (ref 19–32)
Chloride: 105 mEq/L (ref 96–112)
Sodium: 136 mEq/L (ref 135–145)
Total Bilirubin: 0.3 mg/dL (ref 0.3–1.2)
Total Protein: 7.6 g/dL (ref 6.0–8.3)

## 2013-05-10 NOTE — Progress Notes (Signed)
Patient ID: Kathleen Savage, female   DOB: 20-May-1976, 37 y.o.   MRN: [id number]          [facility] for Infectious Disease  Patient Active Problem List   Diagnosis Date Noted  . HIV DISEASE 09/14/2006    Priority: High  . Morbid obesity 05/07/2010    Priority: Medium  . Dyslipidemia 01/25/2013  . Hyperglycemia 01/25/2013  . Multiple lipomas 01/25/2013  . LOW BACK PAIN, CHRONIC 11/28/2008  . ALOPECIA 08/03/2008  . 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 PAP SMEAR, ABNORMAL 12/01/2006    Patient's Medications  New Prescriptions   No medications on file  Previous Medications   EFAVIRENZ-EMTRICITABINE-TENOFOVIR (ATRIPLA) 600-200-300 MG PER TABLET    Take 1 tablet by mouth at bedtime.   OMEPRAZOLE (PRILOSEC) 10 MG CAPSULE    Take 10 mg by mouth daily.  Modified Medications   No medications on file  Discontinued Medications   No medications on file    Subjective: Kathleen Savage is in for her routine visit. As usual she never misses a single dose of her antiretroviral medication. She switched back to Atripla after her visit 3 months ago and finds that much easier. She has had no problems obtaining, taking her tolerating it.  She did meet with the nutritionist after her last visit here. She reviewed exercise and dietary changes she should make. She says she is still not getting regular exercise. She does not have anyone to exercise with. She estimates that she would be able to walk about 4 days each week but has not made that habit. When she did walk she would walk about 1 mile in 30 minutes.  She says that she has changed a few things in her diet. She is cooking with less oil and has been buying more vegetables. However she smiles and laughs and says that she is " addicted to bread and soft drinks". She says that she does not really understand how to count carbs in her diet.  Review of Systems: Constitutional: negative Eyes: negative Ears, nose, mouth, throat, and face:  negative Respiratory: negative Cardiovascular: negative Gastrointestinal: negative Genitourinary:negative  Past Medical History  Diagnosis Date  . Hyperlipidemia   . Glucose intolerance (pre-diabetes)     History  Substance Use Topics  . Smoking status: Never Smoker   . Smokeless tobacco: Never Used  . Alcohol Use: No    No family history on file.  No Known Allergies  Objective: Temp: 98.2 F (36.8 C) (07/01 0917) Temp src: Oral (07/01 0917) BP: 108/75 mmHg (07/01 0917) Pulse Rate: 74 (07/01 0917)  General: She is in good spirits. Her body mass index remains just over 40 Oral: No oropharyngeal lesions. Her teeth are in very good condition Skin: No rash Lungs: Clear Cor: Regular S1 and S2 with no murmurs Abdomen: Obese, soft and nontender Joints and extremities: Normal Neuro: Alert and fully oriented with normal speech Mood and affect: Normal  Lab Results HIV 1 RNA Quant (copies/mL)  Date Value  01/11/2013 <20   07/13/2012 <20   01/06/2012 <20      CD4 T Cell Abs (cmm)  Date Value  01/11/2013 540   07/13/2012 620   01/06/2012 570      Lab Results  Component Value Date   WBC 4.6 01/11/2013   HGB 12.4 01/11/2013   HCT 35.8* 01/11/2013   MCV 104.4* 01/11/2013   PLT 360 01/11/2013   BMET    Component Value Date/Time   NA 135 01/11/2013 1050  K 4.3 01/11/2013 1050   CL 103 01/11/2013 1050   CO2 24 01/11/2013 1050   GLUCOSE 124* 01/11/2013 1050   BUN 9 01/11/2013 1050   CREATININE 0.62 01/11/2013 1050   CREATININE 0.58 12/05/2010 2023   CALCIUM 8.5 01/11/2013 1050   Lab Results  Component Value Date   ALT 41* 01/11/2013   AST 22 01/11/2013   ALKPHOS 53 01/11/2013   BILITOT 2.5* 01/11/2013   Lab Results  Component Value Date   CHOL 204* 01/11/2013   HDL 44 01/11/2013   LDLCALC 914* 01/11/2013   TRIG 142 01/11/2013   CHOLHDL 4.6 01/11/2013   Assessment: Her HIV infection has been under very good control expected to remain so on Atripla. I will continue Atripla and check lab work  today.  She has morbid obesity, hyperglycemia and elevation of her LDL cholesterol. Her hemoglobin A1c in March was normal. I talked to her at length about the importance of continued efforts to make sustainable modifications in her lifestyle. I asked her cycle of trying to walk 3-4 days each week. I encouraged her further changes she has been able to make in her eating habits. I think it may be useful her to followup with a nutritionist for some further counseling about counting carbs.  Plan: 1. Continue Atripla 2. Check lab work today 3. Encouraged continued lifestyle modification and will refer back for further nutritional counseling 4. Follow up here in 6 months   Cliffton Asters, MD [facility] for Infectious Disease [facility] [phone] pager   [phone] cell 05/10/2013, 9:50 AM

## 2013-05-11 LAB — HIV-1 RNA QUANT-NO REFLEX-BLD: HIV 1 RNA Quant: <20 copies/mL (ref <20)

## 2013-05-11 LAB — T-HELPER CELL (CD4) - (RCID CLINIC ONLY): CD4 T Cell Abs: 560 uL (ref 400–2700)

## 2013-05-16 ENCOUNTER — Ambulatory Visit (INDEPENDENT_AMBULATORY_CARE_PROVIDER_SITE_OTHER): Payer: Self-pay | Admitting: *Deleted

## 2013-05-16 ENCOUNTER — Ambulatory Visit: Payer: Self-pay | Admitting: Dietician

## 2013-05-16 DIAGNOSIS — Z124 Encounter for screening for malignant neoplasm of cervix: Secondary | ICD-10-CM

## 2013-05-16 NOTE — Patient Instructions (Signed)
  Sus resultados estarn listos en [employer]. Les voy a enviar a usted. Gracias por venir al [employer] para su cuidado.

## 2013-05-16 NOTE — Progress Notes (Signed)
Subjective:HPI    Review of Systems      Objective:  Physical Exam

## 2013-05-16 NOTE — Progress Notes (Signed)
  Subjective:     Kathleen Savage is a 37 y.o. woman who comes in today for a  pap smear only.  Previous abnormal Pap smears: no. Contraception: condoms  Objective:    LMP 04/18/2013 Pelvic Exam:  Pap smear obtained.   Assessment:    Screening pap smear.   Plan:    Follow up in one year, or as indicated by Pap results.  Pt given educational materials re: HIV and diet, nutrition, exercise, BSE, health promotion, self-esteem and PAP smears.  Pt given condoms.

## 2013-05-16 NOTE — Progress Notes (Signed)
Medical Nutrition Therapy:  Appt start time: 1700 end time:  1800.  Assessment:  Primary concerns today: hyperglycemia, elevated LDL, overweight. Pt is very disappointed today to see her weight has increased. She has begun exercising in the past week and has not eaten her sweet bread or drunk sodas during this time.  MEDICATIONS: see list.   DIETARY INTAKE:  Usual eating pattern includes 4 meals and 0 snacks per day.  Everyday foods include cereal, tortillas.  Avoided foods include none noted.    24-hr recall:  B ( AM): usually eaten at work about 8:30 or 9 am. side salad with cheese, corn, and ranch dressing  Snk ( AM): none  L ( PM): eaten late, usually 4 pm. 2 eggs in oil, onion, tomato, 2 medium tortillas (sometimes corn, sometimes flour) Snk ( PM): oatmeal plain, made with milk D ( PM): corn flakes with milk (2%) Snk ( PM): none Beverages: sweet tea, soda, water Pt works 6:30 am to 2 pm weekdays and gets a 30-45 minute break only within 8 am to 930 am timeframe.   Usual physical activity: little until recent purchase of an elliptical machine, which she seems to like, but her endurance is low now  Kathleen Savage has many questions related to fairly common nutrition misconceptions, such as the safety of diet drinks and artificial sweeteners, the value of diet supplement pills, using diet shakes like slim fast or herbal life, not eating after 6 pm, etc.  Progress Towards Goal(s):  In progress.   Nutritional Diagnosis:  [place]-3.3 Overweight/obesity As related to inconsistent meal pattern, high kcal choices (soda), low PA.  As evidenced by diet recall, pt reports of same.    Intervention:  Nutrition counseling provided regarding reduction in CHO in a given sitting (less emphasis placed on actual CHO counting, as this seems to frustrate her more than help), choosing more lean protein foods, trying to eat closer to waking, and trying to eat meals within 6 hours of each other. Physical  activity goals also discussed (goal 150 minutes each week, working hard enough to making talking difficult), and RD answered the pt's questions, including that reasonable amounts of diet drinks and artificial sweeteners are quite safe, OTC diet pills are typically ineffective or unsafe (due to amphetamines or large doses of caffeine), diet shakes are perfectly fine but are not inherently special, just easy and transportable, and that eating high kcal snack foods after already eating dinner is not good, rather than something inherent to the time of evening. After good discussion with Kathleen Savage, she has identified a reasonable way to eat more regularly throughout the day and include high protein foods at each meal. She will have a glass of milk before leaving for work. At work, she will have a similar salad with grilled chix during her break, and a slim fast shake around noon to replace lunch. She will then eat a lite early dinner, and may have oatmeal or cereal with a greek yogurt later in the evening if still hungry. She also expressed good understanding on the importance of cutting out sweet drinks and limiting portions for CHO foods. She claims to love Coke, so RD recommended she choose Coke Zero most of the time, and enjoy a single can of regular coke each week.  She left today feeling much better about her ability to adapt to these changes going forward, and much more confident about making choices in her diet (she clearly is influenced by a number of  people around her providing inaccurate or misleading food and nutrition information).  Monitoring/Evaluation:  Dietary intake, exercise, portion control, and body weight in 2 month(s).

## 2013-05-18 ENCOUNTER — Encounter: Payer: Self-pay | Admitting: *Deleted

## 2013-08-09 ENCOUNTER — Ambulatory Visit: Payer: Self-pay

## 2013-09-05 ENCOUNTER — Other Ambulatory Visit: Payer: Self-pay | Admitting: *Deleted

## 2013-09-05 DIAGNOSIS — B20 Human immunodeficiency virus [HIV] disease: Secondary | ICD-10-CM

## 2013-09-05 MED ORDER — EFAVIRENZ-EMTRICITAB-TENOFOVIR 600-200-300 MG PO TABS: 1.0000 | ORAL_TABLET | Freq: Every day | ORAL | Status: DC

## 2013-09-15 ENCOUNTER — Other Ambulatory Visit: Payer: Self-pay

## 2013-11-15 ENCOUNTER — Other Ambulatory Visit (INDEPENDENT_AMBULATORY_CARE_PROVIDER_SITE_OTHER): Payer: Self-pay

## 2013-11-15 DIAGNOSIS — B20 Human immunodeficiency virus [HIV] disease: Secondary | ICD-10-CM

## 2013-11-15 DIAGNOSIS — Z79899 Other long term (current) drug therapy: Secondary | ICD-10-CM

## 2013-11-15 LAB — COMPLETE METABOLIC PANEL WITH GFR
ALT: 68 U/L — AB (ref 0–35)
AST: 40 U/L — ABNORMAL HIGH (ref 0–37)
Albumin: 3.8 g/dL (ref 3.5–5.2)
Alkaline Phosphatase: 73 U/L (ref 39–117)
BILIRUBIN TOTAL: 0.3 mg/dL (ref 0.3–1.2)
BUN: 13 mg/dL (ref 6–23)
CO2: 26 mEq/L (ref 19–32)
CREATININE: 0.51 mg/dL (ref 0.50–1.10)
Calcium: 8.6 mg/dL (ref 8.4–10.5)
Chloride: 105 mEq/L (ref 96–112)
GLUCOSE: 103 mg/dL — AB (ref 70–99)
Potassium: 4.5 mEq/L (ref 3.5–5.3)
Sodium: 138 mEq/L (ref 135–145)
Total Protein: 7.1 g/dL (ref 6.0–8.3)

## 2013-11-15 LAB — LIPID PANEL
Cholesterol: 221 mg/dL — ABNORMAL HIGH (ref 0–200)
HDL: 48 mg/dL (ref >39)
LDL Cholesterol: 137 mg/dL — ABNORMAL HIGH (ref 0–99)
TRIGLYCERIDES: 180 mg/dL — AB (ref <150)
Total CHOL/HDL Ratio: 4.6 Ratio
VLDL: 36 mg/dL (ref 0–40)

## 2013-11-17 LAB — T-HELPER CELL (CD4) - (RCID CLINIC ONLY)
CD4 % Helper T Cell: 33 % (ref 33–55)
CD4 T Cell Abs: 690 /uL (ref 400–2700)

## 2013-11-17 LAB — HIV-1 RNA QUANT-NO REFLEX-BLD: HIV-1 RNA Quant, Log: <1.3 {Log} (ref <1.30)

## 2013-11-29 ENCOUNTER — Ambulatory Visit (INDEPENDENT_AMBULATORY_CARE_PROVIDER_SITE_OTHER): Payer: Self-pay | Admitting: Internal Medicine

## 2013-11-29 ENCOUNTER — Encounter: Payer: Self-pay | Admitting: Internal Medicine

## 2013-11-29 VITALS — BP 135/77 | HR 76 | Temp 98.4°F | Ht 62.0 in | Wt 247.8 lb

## 2013-11-29 DIAGNOSIS — Z23 Encounter for immunization: Secondary | ICD-10-CM

## 2013-11-29 DIAGNOSIS — R748 Abnormal levels of other serum enzymes: Secondary | ICD-10-CM

## 2013-11-29 DIAGNOSIS — B20 Human immunodeficiency virus [HIV] disease: Secondary | ICD-10-CM

## 2013-11-29 NOTE — Progress Notes (Addendum)
Patient ID: Kathleen Savage, female   DOB: 05-06-1976, 38 y.o.   MRN: [id number]          [facility]  Patient Active Problem List   Diagnosis Date Noted  . HIV DISEASE 09/14/2006    Priority: High  . Dyslipidemia 01/25/2013    Priority: Medium  . Hyperglycemia 01/25/2013    Priority: Medium  . Morbid obesity 05/07/2010    Priority: Medium  . Multiple lipomas 01/25/2013  . LOW BACK PAIN, CHRONIC 11/28/2008  . ALOPECIA 08/03/2008  . 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 PAP SMEAR, ABNORMAL 12/01/2006    Patient's Medications  New Prescriptions   No medications on file  Previous Medications   CALCIUM CARBONATE (TUMS - DOSED IN MG ELEMENTAL CALCIUM) 500 MG CHEWABLE TABLET    Chew 1 tablet by mouth daily.   EFAVIRENZ-EMTRICITABINE-TENOFOVIR (ATRIPLA) 600-200-300 MG PER TABLET    Take 1 tablet by mouth at bedtime.   OMEPRAZOLE (PRILOSEC) 10 MG CAPSULE    Take 10 mg by mouth daily.  Modified Medications   No medications on file  Discontinued Medications   No medications on file    Subjective: Kathleen Savage is in for her routine visit. Her husband is with her today. She never misses a single dose of her Atripla. She is now taking omeprazole because she is only having occasional problems with acid indigestion. She estimates that she will take some time once or twice each month.  She did meet with the nutritionist again after her last visit and has been trying to count calories carbs but finds it extremely difficult. She and her husband were different shifts so it is hard for them to eat and exercise together. She is not getting any regular exercise. She states that she often feels hungry even after eating a full meal.  Review of Systems: Pertinent items are noted in HPI.  Past Medical History  Diagnosis Date  . Hyperlipidemia   . Glucose intolerance (pre-diabetes)     History  Substance Use Topics  . Smoking status: Never Smoker   . Smokeless tobacco: Never  Used  . Alcohol Use: No    No family history on file.  No Known Allergies  Objective: Temp: 98.4 F (36.9 C) (01/20 0902) Temp src: Oral (01/20 0902) BP: 135/77 mmHg (01/20 0902) Pulse Rate: 76 (01/20 0902)  General: Her weight is up to 247 pounds with a body mass index of 45 Oral: No oropharyngeal lesions Skin: No rash Lungs: Clear Cor: Regular S1 and S2 no murmurs Abdomen: nontender Joints and extremities: normal Neuro: Alert with normal speech and conversation Mood and affect: Normal  Lab Results Lab Results  Component Value Date   WBC 5.0 05/10/2013   HGB 13.6 05/10/2013   HCT 37.7 05/10/2013   MCV 86.9 05/10/2013   PLT 304 05/10/2013    Lab Results  Component Value Date   CREATININE 0.51 11/15/2013   BUN 13 11/15/2013   NA 138 11/15/2013   K 4.5 11/15/2013   CL 105 11/15/2013   CO2 26 11/15/2013    Lab Results  Component Value Date   ALT 68* 11/15/2013   AST 40* 11/15/2013   ALKPHOS 73 11/15/2013   BILITOT 0.3 11/15/2013    Lab Results  Component Value Date   CHOL 221* 11/15/2013   HDL 48 11/15/2013   LDLCALC 137* 11/15/2013   TRIG 180* 11/15/2013   CHOLHDL 4.6 11/15/2013    Lab Results HIV 1 RNA Quant (copies/mL)  Date Value  11/15/2013 <20   05/10/2013 <20   01/11/2013 <20      CD4 T Cell Abs (/uL)  Date Value  11/15/2013 690   05/10/2013 560   01/11/2013 540      Assessment: Her HIV infection remains under excellent control.  She has severe, morbid obesity, dyslipidemia and mild hyperglycemia. She is struggling with lifestyle modification but does seem motivated. I encouraged her to try to get regular exercise.  Continue Tums as needed for acid indigestion.  She had some elevations of her liver enzymes which I suspect may be due to fatty infiltration. However I will check viral hepatitis panel before her next visit.  Plan: 1. Continue Atripla 2. Lifestyle modification counseling provided 3. Followup after lab work in [place] months   Michel Bickers, MD [facility] for  [place] [phone] pager   [phone] cell 11/29/2013, 9:37 AM

## 2013-11-29 NOTE — Addendum Note (Signed)
Addended by: Michel Bickers on: 11/29/2013 10:32 AM   Modules accepted: Orders

## 2014-01-03 ENCOUNTER — Ambulatory Visit: Payer: Self-pay

## 2014-01-10 ENCOUNTER — Ambulatory Visit: Payer: Self-pay

## 2014-01-10 ENCOUNTER — Encounter: Payer: Self-pay | Admitting: *Deleted

## 2014-01-13 ENCOUNTER — Other Ambulatory Visit: Payer: Self-pay | Admitting: Licensed Clinical Social Worker

## 2014-01-13 DIAGNOSIS — B20 Human immunodeficiency virus [HIV] disease: Secondary | ICD-10-CM

## 2014-01-13 MED ORDER — EFAVIRENZ-EMTRICITAB-TENOFOVIR 600-200-300 MG PO TABS: 1.0000 | ORAL_TABLET | Freq: Every day | ORAL | Status: DC

## 2014-05-30 ENCOUNTER — Other Ambulatory Visit (INDEPENDENT_AMBULATORY_CARE_PROVIDER_SITE_OTHER): Payer: Self-pay

## 2014-05-30 DIAGNOSIS — B20 Human immunodeficiency virus [HIV] disease: Secondary | ICD-10-CM

## 2014-05-30 DIAGNOSIS — R748 Abnormal levels of other serum enzymes: Secondary | ICD-10-CM

## 2014-05-31 LAB — HEPATITIS B SURFACE ANTIGEN: Hepatitis B Surface Ag: NEGATIVE

## 2014-05-31 LAB — HEPATITIS B SURFACE ANTIBODY,QUALITATIVE: Hep B S Ab: NEGATIVE

## 2014-05-31 LAB — T-HELPER CELL (CD4) - (RCID CLINIC ONLY)
CD4 T CELL HELPER: 38 % (ref 33–55)
CD4 T Cell Abs: 720 /uL (ref 400–2700)

## 2014-05-31 LAB — HEPATITIS C ANTIBODY: HCV AB: NEGATIVE

## 2014-06-03 LAB — HIV-1 RNA QUANT-NO REFLEX-BLD
HIV 1 RNA Quant: <20 copies/mL (ref <20)
HIV-1 RNA Quant, Log: <1.3 {Log} (ref <1.30)

## 2014-06-13 ENCOUNTER — Ambulatory Visit (INDEPENDENT_AMBULATORY_CARE_PROVIDER_SITE_OTHER): Payer: Self-pay | Admitting: Internal Medicine

## 2014-06-13 ENCOUNTER — Ambulatory Visit: Payer: Self-pay

## 2014-06-13 VITALS — BP 120/85 | HR 79 | Temp 98.0°F | Wt 244.0 lb

## 2014-06-13 DIAGNOSIS — R21 Rash and other nonspecific skin eruption: Secondary | ICD-10-CM | POA: Insufficient documentation

## 2014-06-13 DIAGNOSIS — B20 Human immunodeficiency virus [HIV] disease: Secondary | ICD-10-CM

## 2014-06-13 DIAGNOSIS — Z23 Encounter for immunization: Secondary | ICD-10-CM

## 2014-06-13 MED ORDER — CLOTRIMAZOLE-BETAMETHASONE 1-0.05 % EX CREA: 1.0000 "application " | TOPICAL_CREAM | Freq: Two times a day (BID) | CUTANEOUS | Status: DC

## 2014-06-13 MED ORDER — CLOTRIMAZOLE 1 % EX CREA: 1.0000 "application " | TOPICAL_CREAM | Freq: Two times a day (BID) | CUTANEOUS | Status: DC

## 2014-06-13 NOTE — Progress Notes (Signed)
Patient ID: Kathleen Savage, female   DOB: 03-17-1976, 38 y.o.   MRN: [id number]          Patient Active Problem List   Diagnosis Date Noted  . HIV DISEASE 09/14/2006    Priority: High  . Elevated liver enzymes 11/29/2013    Priority: Medium  . Dyslipidemia 01/25/2013    Priority: Medium  . Hyperglycemia 01/25/2013    Priority: Medium  . Morbid obesity 05/07/2010    Priority: Medium  . Multiple lipomas 01/25/2013  . LOW BACK PAIN, CHRONIC 11/28/2008  . ALOPECIA 08/03/2008  . 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 PAP SMEAR, ABNORMAL 12/01/2006    Patient's Medications  New Prescriptions   CLOTRIMAZOLE-BETAMETHASONE (LOTRISONE) CREAM    Apply 1 application topically 2 (two) times daily.  Previous Medications   CALCIUM CARBONATE (TUMS - DOSED IN MG ELEMENTAL CALCIUM) 500 MG CHEWABLE TABLET    Chew 1 tablet by mouth daily.   EFAVIRENZ-EMTRICITABINE-TENOFOVIR (ATRIPLA) 600-200-300 MG PER TABLET    Take 1 tablet by mouth at bedtime.   OMEPRAZOLE (PRILOSEC) 10 MG CAPSULE    Take 10 mg by mouth daily.  Modified Medications   No medications on file  Discontinued Medications   No medications on file    Subjective: Kathleen Savage is here for her routine visit. She her family just recently returned from a vacation to [facility]. She recalls missing only one dose of her Atripla when she failed to pick up her refill on time. She is doing well with the exception of a slightly pruritic rash on the left side of her neck. Review of Systems: Pertinent items are noted in HPI.  Past Medical History  Diagnosis Date  . Hyperlipidemia   . Glucose intolerance (pre-diabetes)     History  Substance Use Topics  . Smoking status: Never Smoker   . Smokeless tobacco: Never Used  . Alcohol Use: No    No family history on file.  No Known Allergies  Objective: Temp: 98 F (36.7 C) (08/04 0848) Temp src: Oral (08/04 0848) BP: 120/85 mmHg (08/04 0848) Pulse Rate: 79 (08/04 0848) Body mass index is  44.62 kg/(m^2).  General: She is smiling and in good spirits as usual Oral: No oropharyngeal lesions Skin: Dry, scaly erythematous patch on the left posterior neck Lungs: Clear Cor: Regular S1 and S2 with no murmurs  Lab Results Lab Results  Component Value Date   WBC 5.0 05/10/2013   HGB 13.6 05/10/2013   HCT 37.7 05/10/2013   MCV 86.9 05/10/2013   PLT 304 05/10/2013    Lab Results  Component Value Date   CREATININE 0.51 11/15/2013   BUN 13 11/15/2013   NA 138 11/15/2013   K 4.5 11/15/2013   CL 105 11/15/2013   CO2 26 11/15/2013    Lab Results  Component Value Date   ALT 68* 11/15/2013   AST 40* 11/15/2013   ALKPHOS 73 11/15/2013   BILITOT 0.3 11/15/2013    Lab Results  Component Value Date   CHOL 221* 11/15/2013   HDL 48 11/15/2013   LDLCALC 137* 11/15/2013   TRIG 180* 11/15/2013   CHOLHDL 4.6 11/15/2013    Lab Results HIV 1 RNA Quant (copies/mL)  Date Value  05/30/2014 <20   11/15/2013 <20   05/10/2013 <20      CD4 T Cell Abs (/uL)  Date Value  05/30/2014 720   11/15/2013 690   05/10/2013 560      Assessment: Her HIV infection remains under excellent control.  I will continue Atripla.  Her rash is quite nonspecific. I will try clotrimazole cream.  Plan: 1. Continue Atripla 2. Trial of clotrimazole cream 3. Followup after lab work in [place] months   Michel Bickers, MD [facility] for [place] [phone] pager   [phone] cell 06/13/2014, 9:05 AM

## 2014-06-23 ENCOUNTER — Ambulatory Visit (INDEPENDENT_AMBULATORY_CARE_PROVIDER_SITE_OTHER): Payer: Self-pay | Admitting: *Deleted

## 2014-06-23 DIAGNOSIS — Z113 Encounter for screening for infections with a predominantly sexual mode of transmission: Secondary | ICD-10-CM

## 2014-06-23 DIAGNOSIS — Z124 Encounter for screening for malignant neoplasm of cervix: Secondary | ICD-10-CM

## 2014-06-23 NOTE — Progress Notes (Signed)
  Subjective:     Kathleen Savage is a 38 y.o. woman who comes in today for a  pap smear only.  Previous abnormal Pap smears: no. Contraception: condoms.  Objective:    LMP 05/24/2014 Pelvic Exam:  Pap smear obtained.   Assessment:    Screening pap smear.   Plan:    Follow up in one year, or as indicated by Pap results.  Pt given educational materials re: HIV and women, self-esteem, BSE, nutrition and diet management, PAP smears and partner safety. Pt given condoms.

## 2014-06-23 NOTE — Patient Instructions (Signed)
Your results will be ready in about a week.  I will mail them to you.  Thank you for coming to the Center for your care.  Denise,  RN 

## 2014-06-27 LAB — CYTOLOGY - PAP

## 2014-06-28 ENCOUNTER — Encounter: Payer: Self-pay | Admitting: *Deleted

## 2014-08-03 ENCOUNTER — Other Ambulatory Visit: Payer: Self-pay | Admitting: Licensed Clinical Social Worker

## 2014-08-03 DIAGNOSIS — B20 Human immunodeficiency virus [HIV] disease: Secondary | ICD-10-CM

## 2014-08-03 MED ORDER — EFAVIRENZ-EMTRICITAB-TENOFOVIR 600-200-300 MG PO TABS: 1.0000 | ORAL_TABLET | Freq: Every day | ORAL | Status: DC

## 2014-09-02 ENCOUNTER — Other Ambulatory Visit: Payer: Self-pay | Admitting: Internal Medicine

## 2014-09-02 DIAGNOSIS — B20 Human immunodeficiency virus [HIV] disease: Secondary | ICD-10-CM

## 2015-01-02 ENCOUNTER — Other Ambulatory Visit: Payer: Self-pay | Admitting: *Deleted

## 2015-01-02 ENCOUNTER — Ambulatory Visit: Payer: Self-pay

## 2015-01-02 ENCOUNTER — Other Ambulatory Visit (INDEPENDENT_AMBULATORY_CARE_PROVIDER_SITE_OTHER): Payer: Self-pay

## 2015-01-02 DIAGNOSIS — Z113 Encounter for screening for infections with a predominantly sexual mode of transmission: Secondary | ICD-10-CM

## 2015-01-02 DIAGNOSIS — B20 Human immunodeficiency virus [HIV] disease: Secondary | ICD-10-CM

## 2015-01-02 LAB — COMPREHENSIVE METABOLIC PANEL
ALK PHOS: 70 U/L (ref 39–117)
ALT: 52 U/L — ABNORMAL HIGH (ref 0–35)
AST: 31 U/L (ref 0–37)
Albumin: 3.8 g/dL (ref 3.5–5.2)
BILIRUBIN TOTAL: 0.4 mg/dL (ref 0.2–1.2)
BUN: 12 mg/dL (ref 6–23)
CO2: 21 meq/L (ref 19–32)
CREATININE: 0.53 mg/dL (ref 0.50–1.10)
Calcium: 8.2 mg/dL — ABNORMAL LOW (ref 8.4–10.5)
Chloride: 105 mEq/L (ref 96–112)
GLUCOSE: 99 mg/dL (ref 70–99)
Potassium: 4.2 mEq/L (ref 3.5–5.3)
Sodium: 136 mEq/L (ref 135–145)
Total Protein: 7.4 g/dL (ref 6.0–8.3)

## 2015-01-02 LAB — CBC
HEMATOCRIT: 38.5 % (ref 36.0–46.0)
Hemoglobin: 13.4 g/dL (ref 12.0–15.0)
MCH: 30 pg (ref 26.0–34.0)
MCHC: 34.8 g/dL (ref 30.0–36.0)
MCV: 86.1 fL (ref 78.0–100.0)
MPV: 9.8 fL (ref 8.6–12.4)
Platelets: 281 10*3/uL (ref 150–400)
RBC: 4.47 MIL/uL (ref 3.87–5.11)
RDW: 14.1 % (ref 11.5–15.5)
WBC: 5.7 10*3/uL (ref 4.0–10.5)

## 2015-01-02 LAB — LIPID PANEL
CHOLESTEROL: 211 mg/dL — AB (ref 0–200)
HDL: 52 mg/dL (ref >=46)
LDL Cholesterol: 137 mg/dL — ABNORMAL HIGH (ref 0–99)
TRIGLYCERIDES: 111 mg/dL (ref <150)
Total CHOL/HDL Ratio: 4.1 Ratio
VLDL: 22 mg/dL (ref 0–40)

## 2015-01-02 MED ORDER — EFAVIRENZ-EMTRICITAB-TENOFOVIR 600-200-300 MG PO TABS: 1.0000 | ORAL_TABLET | Freq: Every day | ORAL | Status: DC

## 2015-01-02 NOTE — Telephone Encounter (Signed)
ADAP Application 

## 2015-01-03 LAB — T-HELPER CELL (CD4) - (RCID CLINIC ONLY)
CD4 T CELL ABS: 710 /uL (ref 400–2700)
CD4 T CELL HELPER: 35 % (ref 33–55)

## 2015-01-03 LAB — HIV-1 RNA QUANT-NO REFLEX-BLD: HIV 1 RNA Quant: <20 copies/mL (ref <20)

## 2015-01-03 LAB — RPR

## 2015-01-23 ENCOUNTER — Encounter: Payer: Self-pay | Admitting: Internal Medicine

## 2015-01-23 ENCOUNTER — Ambulatory Visit (INDEPENDENT_AMBULATORY_CARE_PROVIDER_SITE_OTHER): Payer: Self-pay | Admitting: Internal Medicine

## 2015-01-23 VITALS — BP 121/78 | HR 85 | Temp 98.1°F | Ht 64.0 in | Wt 252.0 lb

## 2015-01-23 DIAGNOSIS — Z23 Encounter for immunization: Secondary | ICD-10-CM

## 2015-01-23 DIAGNOSIS — B20 Human immunodeficiency virus [HIV] disease: Secondary | ICD-10-CM

## 2015-01-23 NOTE — Addendum Note (Signed)
Addended by: Janyce Llanos F on: 01/23/2015 11:14 AM   Modules accepted: Orders

## 2015-01-23 NOTE — Progress Notes (Signed)
Patient ID: Kathleen Savage, female   DOB: [birth year], 39 y.o.   MRN: [id number]          Patient Active Problem List   Diagnosis Date Noted  . Human immunodeficiency virus (HIV) disease 09/14/2006    Priority: High  . Elevated liver enzymes 11/29/2013    Priority: Medium  . Dyslipidemia 01/25/2013    Priority: Medium  . Hyperglycemia 01/25/2013    Priority: Medium  . Morbid obesity 05/07/2010    Priority: Medium  . Rash 06/13/2014  . Multiple lipomas 01/25/2013  . LOW BACK PAIN, CHRONIC 11/28/2008  . ALOPECIA 08/03/2008  . 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 PAP SMEAR, ABNORMAL 12/01/2006    Patient's Medications  New Prescriptions   No medications on file  Previous Medications   CALCIUM CARBONATE (TUMS - DOSED IN MG ELEMENTAL CALCIUM) 500 MG CHEWABLE TABLET    Chew 1 tablet by mouth daily.   CLOTRIMAZOLE (LOTRIMIN) 1 % CREAM    Apply 1 application topically 2 (two) times daily.   EFAVIRENZ-EMTRICITABINE-TENOFOVIR (ATRIPLA) 600-200-300 MG PER TABLET    Take 1 tablet by mouth at bedtime.   OMEPRAZOLE (PRILOSEC) 10 MG CAPSULE    Take 10 mg by mouth daily.  Modified Medications   No medications on file  Discontinued Medications   No medications on file    Subjective: Kathleen Savage is in for her routine HIV follow-up visit. She recalls missing only one dose of her Atripla in the past 6 months when she was late getting to the pharmacy to pick up her refill. She tolerates it well. She has cut down on carbonated sodas but still states that she "loves to eat". She is not getting regular exercise. When she does try to walk she gets winded and has some pain in her right hip and knee. She does state that she has talked to a friend who is going to start walking more with her. The rash on the left side of her neck resolved shortly after her visit 6 months ago.  Review of Systems: Constitutional: positive for fatigue, negative for anorexia, chills, fevers, sweats and weight loss Eyes:  negative Ears, nose, mouth, throat, and face: negative Respiratory: positive for dyspnea on exertion, negative for cough, pleurisy/chest pain, sputum and wheezing Cardiovascular: negative Gastrointestinal: negative Genitourinary:negative  Past Medical History  Diagnosis Date  . Hyperlipidemia   . Glucose intolerance (pre-diabetes)   . HIV infection     History  Substance Use Topics  . Smoking status: Never Smoker   . Smokeless tobacco: Never Used  . Alcohol Use: No    Family History  Problem Relation Age of Onset  . Cancer Maternal Aunt     Breast Cancer  . Cancer Cousin     Breast cancer - maternal cousin  . Cancer Cousin     breast cancer  . Cancer Cousin     breast cancer  . Cancer Maternal Aunt     stomach cancer  . Stroke Father   . Cancer Sister     No Known Allergies  Objective: Temp: 98.1 F (36.7 C) (03/15 1021) Temp Source: Oral (03/15 1021) BP: 121/78 mmHg (03/15 1021) Pulse Rate: 85 (03/15 1021) Body mass index is 43.23 kg/(m^2).  General: Her weight is 252 pounds Oral: No oropharyngeal lesions Skin: Her left neck rash has resolved Lungs: Clear Cor: Regular S1 and S2 no murmurs Abdomen: Obese, soft and nontender Joints and extremities: Normal Neuro: Alert with normal speech and conversation Mood: Normal  Lab  Results Lab Results  Component Value Date   WBC 5.7 01/02/2015   HGB 13.4 01/02/2015   HCT 38.5 01/02/2015   MCV 86.1 01/02/2015   PLT 281 01/02/2015    Lab Results  Component Value Date   CREATININE 0.53 01/02/2015   BUN 12 01/02/2015   NA 136 01/02/2015   K 4.2 01/02/2015   CL 105 01/02/2015   CO2 21 01/02/2015    Lab Results  Component Value Date   ALT 52* 01/02/2015   AST 31 01/02/2015   ALKPHOS 70 01/02/2015   BILITOT 0.4 01/02/2015    Lab Results  Component Value Date   CHOL 211* 01/02/2015   HDL 52 01/02/2015   LDLCALC 137* 01/02/2015   TRIG 111 01/02/2015   CHOLHDL 4.1 01/02/2015    Lab Results HIV 1  RNA QUANT (copies/mL)  Date Value  01/02/2015 <20  05/30/2014 <20  11/15/2013 <20   CD4 T CELL ABS (/uL)  Date Value  01/02/2015 710  05/30/2014 720  11/15/2013 690     Assessment: Her HIV infection remains under excellent control. I will continue Atripla.  She remains morbidly obese and this places her at risk for metabolic syndrome and wear-and-tear arthritis. Her blood sugar has decreased as she has cut out carbonated sodas but her LDL cholesterol remains elevated. I talked to her about the importance of regular exercise, limiting carbohydrates and fats in her diet and weight reduction.  Plan: 1. Continue Atripla 2. Encourage lifestyle modification 3. Follow-up after lab work in [place] months   Michel Bickers, MD [facility] for [place] [phone] pager   [phone] cell 01/23/2015, 10:55 AM

## 2015-02-09 ENCOUNTER — Other Ambulatory Visit (HOSPITAL_COMMUNITY): Admission: RE | Admit: 2015-02-09 | Payer: Self-pay | Source: Ambulatory Visit | Admitting: Internal Medicine

## 2015-02-09 ENCOUNTER — Ambulatory Visit (INDEPENDENT_AMBULATORY_CARE_PROVIDER_SITE_OTHER): Payer: Self-pay | Admitting: *Deleted

## 2015-02-09 DIAGNOSIS — Z124 Encounter for screening for malignant neoplasm of cervix: Secondary | ICD-10-CM

## 2015-02-09 NOTE — Patient Instructions (Signed)
Your results will be ready in about a week.  I will mail them to you.  Thank you for coming to the Center for your care.  Ritaj Dullea,  RN 

## 2015-02-09 NOTE — Progress Notes (Signed)
  Subjective:     Kathleen Savage is a 39 y.o. woman who comes in today for a  pap smear only.  Previous abnormal Pap smears: no. Contraception: condoms  Objective:    LMP 01/08/2015 Pelvic Exam:  Pap smear obtained.   Assessment:    Screening pap smear.   Plan:    Follow up in year, or as indicated by Pap results.   Pt given educational materials re: HIV and women, self-esteem, BSE, nutrition and diet management, PAP smears and partner safety. Pt given condoms.

## 2015-02-13 LAB — CYTOLOGY - PAP

## 2015-02-15 ENCOUNTER — Encounter: Payer: Self-pay | Admitting: *Deleted

## 2015-02-20 ENCOUNTER — Encounter: Payer: Self-pay | Admitting: *Deleted

## 2015-03-10 ENCOUNTER — Other Ambulatory Visit: Payer: Self-pay | Admitting: Internal Medicine

## 2015-06-14 ENCOUNTER — Ambulatory Visit: Payer: Self-pay

## 2015-07-14 ENCOUNTER — Other Ambulatory Visit: Payer: Self-pay | Admitting: Infectious Disease

## 2015-07-14 DIAGNOSIS — B20 Human immunodeficiency virus [HIV] disease: Secondary | ICD-10-CM

## 2015-07-26 ENCOUNTER — Other Ambulatory Visit (INDEPENDENT_AMBULATORY_CARE_PROVIDER_SITE_OTHER): Payer: Self-pay

## 2015-07-26 DIAGNOSIS — B20 Human immunodeficiency virus [HIV] disease: Secondary | ICD-10-CM

## 2015-07-26 LAB — COMPREHENSIVE METABOLIC PANEL
ALBUMIN: 4 g/dL (ref 3.6–5.1)
ALK PHOS: 69 U/L (ref 33–115)
ALT: 32 U/L — AB (ref 6–29)
AST: 30 U/L (ref 10–30)
BILIRUBIN TOTAL: 0.3 mg/dL (ref 0.2–1.2)
BUN: 11 mg/dL (ref 7–25)
CALCIUM: 8.4 mg/dL — AB (ref 8.6–10.2)
CO2: 20 mmol/L (ref 20–31)
Chloride: 104 mmol/L (ref 98–110)
Creat: 0.53 mg/dL (ref 0.50–1.10)
Glucose, Bld: 109 mg/dL — ABNORMAL HIGH (ref 65–99)
Potassium: 3.8 mmol/L (ref 3.5–5.3)
Sodium: 136 mmol/L (ref 135–146)
TOTAL PROTEIN: 7.4 g/dL (ref 6.1–8.1)

## 2015-07-27 LAB — HIV-1 RNA QUANT-NO REFLEX-BLD

## 2015-07-30 LAB — T-HELPER CELL (CD4) - (RCID CLINIC ONLY)
CD4 % Helper T Cell: 43 % (ref 33–55)
CD4 T Cell Abs: 770 /uL (ref 400–2700)

## 2015-08-09 ENCOUNTER — Encounter: Payer: Self-pay | Admitting: Internal Medicine

## 2015-08-09 ENCOUNTER — Ambulatory Visit (INDEPENDENT_AMBULATORY_CARE_PROVIDER_SITE_OTHER): Payer: Self-pay | Admitting: Internal Medicine

## 2015-08-09 VITALS — BP 111/75 | HR 74 | Temp 98.2°F | Wt 246.0 lb

## 2015-08-09 DIAGNOSIS — Z23 Encounter for immunization: Secondary | ICD-10-CM

## 2015-08-09 DIAGNOSIS — B20 Human immunodeficiency virus [HIV] disease: Secondary | ICD-10-CM

## 2015-08-09 MED ORDER — ELVITEG-COBIC-EMTRICIT-TENOFAF 150-150-200-10 MG PO TABS: 1.0000 | ORAL_TABLET | Freq: Every day | ORAL | Status: DC

## 2015-08-09 NOTE — Progress Notes (Signed)
Patient ID: Kathleen Savage, female   DOB: 1976-11-06, 39 y.o.   MRN: [id number]          Patient Active Problem List   Diagnosis Date Noted  . Human immunodeficiency virus (HIV) disease 09/14/2006    Priority: High  . Elevated liver enzymes 11/29/2013    Priority: Medium  . Dyslipidemia 01/25/2013    Priority: Medium  . Hyperglycemia 01/25/2013    Priority: Medium  . Morbid obesity 05/07/2010    Priority: Medium  . Rash 06/13/2014  . Multiple lipomas 01/25/2013  . LOW BACK PAIN, CHRONIC 11/28/2008  . ALOPECIA 08/03/2008  . 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 PAP SMEAR, ABNORMAL 12/01/2006    Patient's Medications  New Prescriptions   ELVITEGRAVIR-COBICISTAT-EMTRICITABINE-TENOFOVIR (GENVOYA) 150-150-200-10 MG TABS TABLET    Take 1 tablet by mouth daily with breakfast.  Previous Medications   CALCIUM CARBONATE (TUMS - DOSED IN MG ELEMENTAL CALCIUM) 500 MG CHEWABLE TABLET    Chew 1 tablet by mouth daily.   CLOTRIMAZOLE (LOTRIMIN) 1 % CREAM    Apply 1 application topically 2 (two) times daily.   OMEPRAZOLE (PRILOSEC) 10 MG CAPSULE    Take 10 mg by mouth daily.  Modified Medications   No medications on file  Discontinued Medications   ATRIPLA 600-200-300 MG PER TABLET    TAKE 1 TABLET BY MOUTH EVERY NIGHT AT BEDTIME    Subjective: Kathleen Savage is in with her husband for her routine HIV follow-up visit. She has not missed any doses of her Atripla. Her work schedule is very erratic and sometimes she has to going to work at 2 AM. She always takes her Atripla around 38 PM. When she goes to work early she is very sleepy. She states that she usually does not sleep very well. She does not have any crazy dreams. She is currently not on any other medications. She will occasionally take some times for intermittent acid reflux. She has had some problem with intermittent draining boils on her left breast. She has never had to have one lanced. She has never been on antibiotics for this. She does not  have any active lesions at this time. She has not had a fever, chills or sweats. She still does not get any regular exercise.   Review of Systems: Pertinent items are noted in HPI.  Past Medical History  Diagnosis Date  . Hyperlipidemia   . Glucose intolerance (pre-diabetes)   . HIV infection     Social History  Substance Use Topics  . Smoking status: Never Smoker   . Smokeless tobacco: Never Used  . Alcohol Use: No    Family History  Problem Relation Age of Onset  . Cancer Maternal Aunt     Breast Cancer  . Cancer Cousin     Breast cancer - maternal cousin  . Cancer Cousin     breast cancer  . Cancer Cousin     breast cancer  . Cancer Maternal Aunt     stomach cancer  . Stroke Father   . Cancer Sister     No Known Allergies  Objective:  Filed Vitals:   08/09/15 0922  BP: 111/75  Pulse: 74  Temp: 98.2 F (36.8 C)  TempSrc: Oral  Weight: 246 lb (111.585 kg)   Body mass index is 42.21 kg/(m^2).  General: Her weight is down 6 pounds Oral: No oropharyngeal lesions Skin: No rash. Some scars on her left breast Lungs: Clear Cor: Regular S1 and S2 with no murmurs Mood: Normal  she does not appear anxious or depressed  Lab Results Lab Results  Component Value Date   WBC 5.7 01/02/2015   HGB 13.4 01/02/2015   HCT 38.5 01/02/2015   MCV 86.1 01/02/2015   PLT 281 01/02/2015    Lab Results  Component Value Date   CREATININE 0.53 07/26/2015   BUN 11 07/26/2015   NA 136 07/26/2015   K 3.8 07/26/2015   CL 104 07/26/2015   CO2 20 07/26/2015    Lab Results  Component Value Date   ALT 32* 07/26/2015   AST 30 07/26/2015   ALKPHOS 69 07/26/2015   BILITOT 0.3 07/26/2015    Lab Results  Component Value Date   CHOL 211* 01/02/2015   HDL 52 01/02/2015   LDLCALC 137* 01/02/2015   TRIG 111 01/02/2015   CHOLHDL 4.1 01/02/2015    Lab Results HIV 1 RNA QUANT (copies/mL)  Date Value  07/26/2015 <20  01/02/2015 <20  05/30/2014 <20   CD4 T CELL ABS (/uL)   Date Value  07/26/2015 770  01/02/2015 710  05/30/2014 720     Problem List Items Addressed This Visit      High   Human immunodeficiency virus (HIV) disease    Her HIV infection is under excellent control. I talked to her about newer, safer and more tolerable regimens for her HIV and will switch her to [facility]. She will take it each morning between 8 and 9 AM when she eats breakfast during her break at work.  She is probably having intermittent boils on her left breast I asked her to call if she has new, draining lesions. I will ask her to come in and have a swab culture obtained to help direct antibiotic therapy.      Relevant Medications   elvitegravir-cobicistat-emtricitabine-tenofovir (GENVOYA) 150-150-200-10 MG TABS tablet   Other Relevant Orders   T-helper cell (CD4)- ([facility] clinic only)   HIV 1 RNA quant-no reflex-bld   CBC   Comprehensive metabolic panel   Lipid panel   RPR        Michel Bickers, MD [facility] for Infectious [place] [phone] pager   [phone] cell 08/09/2015, 9:51 AM

## 2015-08-09 NOTE — Assessment & Plan Note (Signed)
Her HIV infection is under excellent control. I talked to her about newer, safer and more tolerable regimens for her HIV and will switch her to [facility]. She will take it each morning between 8 and 9 AM when she eats breakfast during her break at work.  She is probably having intermittent boils on her left breast I asked her to call if she has new, draining lesions. I will ask her to come in and have a swab culture obtained to help direct antibiotic therapy.

## 2015-11-14 ENCOUNTER — Ambulatory Visit: Payer: Self-pay

## 2016-01-22 ENCOUNTER — Other Ambulatory Visit: Payer: Self-pay

## 2016-01-29 ENCOUNTER — Other Ambulatory Visit: Payer: Self-pay

## 2016-01-29 DIAGNOSIS — B20 Human immunodeficiency virus [HIV] disease: Secondary | ICD-10-CM

## 2016-01-29 LAB — COMPREHENSIVE METABOLIC PANEL
ALT: 27 U/L (ref 6–29)
AST: 18 U/L (ref 10–30)
Albumin: 3.9 g/dL (ref 3.6–5.1)
Alkaline Phosphatase: 46 U/L (ref 33–115)
BUN: 11 mg/dL (ref 7–25)
CALCIUM: 8.5 mg/dL — AB (ref 8.6–10.2)
CHLORIDE: 104 mmol/L (ref 98–110)
CO2: 24 mmol/L (ref 20–31)
Creat: 0.62 mg/dL (ref 0.50–1.10)
GLUCOSE: 97 mg/dL (ref 65–99)
POTASSIUM: 4 mmol/L (ref 3.5–5.3)
Sodium: 136 mmol/L (ref 135–146)
Total Bilirubin: 0.3 mg/dL (ref 0.2–1.2)
Total Protein: 7.3 g/dL (ref 6.1–8.1)

## 2016-01-29 LAB — LIPID PANEL
CHOL/HDL RATIO: 4.1 ratio (ref <=5.0)
Cholesterol: 230 mg/dL — ABNORMAL HIGH (ref 125–200)
HDL: 56 mg/dL (ref >=46)
LDL CALC: 151 mg/dL — AB (ref <130)
Triglycerides: 114 mg/dL (ref <150)
VLDL: 23 mg/dL (ref <30)

## 2016-01-29 LAB — CBC
HEMATOCRIT: 37 % (ref 36.0–46.0)
Hemoglobin: 12.7 g/dL (ref 12.0–15.0)
MCH: 30.2 pg (ref 26.0–34.0)
MCHC: 34.3 g/dL (ref 30.0–36.0)
MCV: 87.9 fL (ref 78.0–100.0)
MPV: 9.1 fL (ref 8.6–12.4)
Platelets: 309 10*3/uL (ref 150–400)
RBC: 4.21 MIL/uL (ref 3.87–5.11)
RDW: 14 % (ref 11.5–15.5)
WBC: 4.7 10*3/uL (ref 4.0–10.5)

## 2016-01-30 LAB — RPR

## 2016-01-30 LAB — T-HELPER CELL (CD4) - (RCID CLINIC ONLY)
CD4 % Helper T Cell: 41 % (ref 33–55)
CD4 T Cell Abs: 730 /uL (ref 400–2700)

## 2016-01-31 LAB — HIV-1 RNA QUANT-NO REFLEX-BLD: HIV-1 RNA Quant, Log: <1.3 Log copies/mL (ref <1.30)

## 2016-02-05 ENCOUNTER — Ambulatory Visit: Payer: Self-pay | Admitting: Internal Medicine

## 2016-02-19 ENCOUNTER — Ambulatory Visit: Payer: Self-pay | Admitting: Internal Medicine

## 2016-02-25 ENCOUNTER — Encounter: Payer: Self-pay | Admitting: Internal Medicine

## 2016-02-25 ENCOUNTER — Ambulatory Visit (INDEPENDENT_AMBULATORY_CARE_PROVIDER_SITE_OTHER): Payer: Self-pay | Admitting: Internal Medicine

## 2016-02-25 VITALS — BP 140/79 | HR 88 | Temp 98.6°F | Wt 252.0 lb

## 2016-02-25 DIAGNOSIS — B20 Human immunodeficiency virus [HIV] disease: Secondary | ICD-10-CM

## 2016-02-25 MED ORDER — ELVITEG-COBIC-EMTRICIT-TENOFAF 150-150-200-10 MG PO TABS: 1.0000 | ORAL_TABLET | Freq: Every day | ORAL | Status: DC

## 2016-02-25 NOTE — Progress Notes (Signed)
Patient Active Problem List   Diagnosis Date Noted  . Human immunodeficiency virus (HIV) disease ([place]) 09/14/2006    Priority: High  . Elevated liver enzymes 11/29/2013    Priority: Medium  . Dyslipidemia 01/25/2013    Priority: Medium  . Hyperglycemia 01/25/2013    Priority: Medium  . Morbid obesity ([place]) 05/07/2010    Priority: Medium  . Rash 06/13/2014  . Multiple lipomas 01/25/2013  . LOW BACK PAIN, CHRONIC 11/28/2008  . ALOPECIA 08/03/2008  . 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 PAP SMEAR, ABNORMAL 12/01/2006    Patient's Medications  New Prescriptions   No medications on file  Previous Medications   CALCIUM CARBONATE (TUMS - DOSED IN MG ELEMENTAL CALCIUM) 500 MG CHEWABLE TABLET    Chew 1 tablet by mouth daily.   CLOTRIMAZOLE (LOTRIMIN) 1 % CREAM    Apply 1 application topically 2 (two) times daily.   OMEPRAZOLE (PRILOSEC) 10 MG CAPSULE    Take 10 mg by mouth daily.  Modified Medications   Modified Medication Previous Medication   ELVITEGRAVIR-COBICISTAT-EMTRICITABINE-TENOFOVIR (GENVOYA) 150-150-200-10 MG TABS TABLET elvitegravir-cobicistat-emtricitabine-tenofovir (GENVOYA) 150-150-200-10 MG TABS tablet      Take 1 tablet by mouth daily with breakfast.    Take 1 tablet by mouth daily with breakfast.  Discontinued Medications   No medications on file    Subjective: Kathleen Savage is in for her routine HIV follow-up visit. She ran out of Genvoya about one month ago because she had not recertified her ADAP. She did come in to fill out her paperwork but has not received approval yet. She denies missing any doses prior to running out. She takes it in the evening but does not always take it with a meal. She has no problems tolerating it. She has had some small bumps in her left axilla but no boils since her last visit.   Review of Systems: Review of Systems  Constitutional: Negative for fever, chills, weight loss, malaise/fatigue and diaphoresis.  HENT: Negative for sore  throat.   Respiratory: Negative for cough, sputum production and shortness of breath.   Cardiovascular: Negative for chest pain.  Gastrointestinal: Negative for nausea, vomiting and diarrhea.  Genitourinary: Negative for dysuria and frequency.  Musculoskeletal: Negative for myalgias and joint pain.  Skin: Negative for rash.  Neurological: Negative for dizziness and headaches.  Psychiatric/Behavioral: Negative for depression and substance abuse. The patient is not nervous/anxious.     Past Medical History  Diagnosis Date  . Hyperlipidemia   . Glucose intolerance (pre-diabetes)   . HIV infection [facility])     Social History  Substance Use Topics  . Smoking status: Never Smoker   . Smokeless tobacco: Never Used  . Alcohol Use: No    Family History  Problem Relation Age of Onset  . Cancer Maternal Aunt     Breast Cancer  . Cancer Cousin     Breast cancer - maternal cousin  . Cancer Cousin     breast cancer  . Cancer Cousin     breast cancer  . Cancer Maternal Aunt     stomach cancer  . Stroke Father   . Cancer Sister     No Known Allergies  Objective:  Filed Vitals:   02/25/16 1457  BP: 140/79  Pulse: 88  Temp: 98.6 F (37 C)  TempSrc: Oral  Weight: 252 lb (114.306 kg)   Body mass index is 43.23 kg/(m^2).  Physical Exam  Constitutional: She is oriented to person,  place, and time.  HENT:  Mouth/Throat: No oropharyngeal exudate.  Eyes: Conjunctivae are normal.  Cardiovascular: Normal rate and regular rhythm.   No murmur heard. Pulmonary/Chest: Breath sounds normal.  Abdominal: Soft. She exhibits no mass. There is no tenderness.  Musculoskeletal: Normal range of motion.  Neurological: She is alert and oriented to person, place, and time.  Skin: No rash noted.  She has no worrisome lesions in her left axilla.  Psychiatric: Mood and affect normal.    Lab Results Lab Results  Component Value Date   WBC 4.7 01/29/2016   HGB 12.7 01/29/2016   HCT 37.0  01/29/2016   MCV 87.9 01/29/2016   PLT 309 01/29/2016    Lab Results  Component Value Date   CREATININE 0.62 01/29/2016   BUN 11 01/29/2016   NA 136 01/29/2016   K 4.0 01/29/2016   CL 104 01/29/2016   CO2 24 01/29/2016    Lab Results  Component Value Date   ALT 27 01/29/2016   AST 18 01/29/2016   ALKPHOS 46 01/29/2016   BILITOT 0.3 01/29/2016    Lab Results  Component Value Date   CHOL 230* 01/29/2016   HDL 56 01/29/2016   LDLCALC 151* 01/29/2016   TRIG 114 01/29/2016   CHOLHDL 4.1 01/29/2016    Lab Results HIV 1 RNA QUANT (copies/mL)  Date Value  01/29/2016 <20  07/26/2015 <20  01/02/2015 <20   CD4 T CELL ABS (/uL)  Date Value  01/29/2016 730  07/26/2015 770  01/02/2015 710      Problem List Items Addressed This Visit      High   Human immunodeficiency virus (HIV) disease ([place]) - Primary    Her infection has been under excellent control for a long time. Try to get her started back on Genvoya as soon as possible with [employer] pending ADAP recertification. She will follow-up in 6 months after blood work.      Relevant Medications   elvitegravir-cobicistat-emtricitabine-tenofovir (GENVOYA) 150-150-200-10 MG TABS tablet   Other Relevant Orders   T-helper cell (CD4)- ([facility] clinic only)   HIV 1 RNA quant-no reflex-bld        Michel Bickers, MD [facility] for [place] [phone] pager   [phone] cell 02/25/2016, 3:30 PM

## 2016-02-25 NOTE — Assessment & Plan Note (Signed)
Her infection has been under excellent control for a long time. Try to get her started back on Genvoya as soon as possible with [employer] pending ADAP recertification. She will follow-up in 6 months after blood work.

## 2016-03-17 ENCOUNTER — Encounter: Payer: Self-pay | Admitting: Internal Medicine

## 2016-06-24 ENCOUNTER — Ambulatory Visit: Payer: Self-pay

## 2016-07-02 ENCOUNTER — Encounter: Payer: Self-pay | Admitting: Internal Medicine

## 2016-07-08 ENCOUNTER — Other Ambulatory Visit (INDEPENDENT_AMBULATORY_CARE_PROVIDER_SITE_OTHER): Payer: Self-pay

## 2016-07-08 DIAGNOSIS — B20 Human immunodeficiency virus [HIV] disease: Secondary | ICD-10-CM

## 2016-07-09 LAB — T-HELPER CELL (CD4) - (RCID CLINIC ONLY)
CD4 % Helper T Cell: 42 % (ref 33–55)
CD4 T CELL ABS: 1000 /uL (ref 400–2700)

## 2016-07-09 LAB — HIV-1 RNA QUANT-NO REFLEX-BLD: HIV 1 RNA Quant: <20 copies/mL (ref <20)

## 2016-07-22 ENCOUNTER — Ambulatory Visit (INDEPENDENT_AMBULATORY_CARE_PROVIDER_SITE_OTHER): Payer: Self-pay | Admitting: Internal Medicine

## 2016-07-22 ENCOUNTER — Encounter: Payer: Self-pay | Admitting: Internal Medicine

## 2016-07-22 DIAGNOSIS — Z23 Encounter for immunization: Secondary | ICD-10-CM

## 2016-07-22 DIAGNOSIS — B20 Human immunodeficiency virus [HIV] disease: Secondary | ICD-10-CM

## 2016-07-22 NOTE — Progress Notes (Signed)
Patient Active Problem List   Diagnosis Date Noted  . Human immunodeficiency virus (HIV) disease ([place]) 09/14/2006    Priority: High  . Elevated liver enzymes 11/29/2013    Priority: Medium  . Dyslipidemia 01/25/2013    Priority: Medium  . Hyperglycemia 01/25/2013    Priority: Medium  . Morbid obesity ([place]) 05/07/2010    Priority: Medium  . Rash 06/13/2014  . Multiple lipomas 01/25/2013  . LOW BACK PAIN, CHRONIC 11/28/2008  . ALOPECIA 08/03/2008  . 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 PAP SMEAR, ABNORMAL 12/01/2006    Patient's Medications  New Prescriptions   No medications on file  Previous Medications   CALCIUM CARBONATE (TUMS - DOSED IN MG ELEMENTAL CALCIUM) 500 MG CHEWABLE TABLET    Chew 1 tablet by mouth daily.   CLOTRIMAZOLE (LOTRIMIN) 1 % CREAM    Apply 1 application topically 2 (two) times daily.   ELVITEGRAVIR-COBICISTAT-EMTRICITABINE-TENOFOVIR (GENVOYA) 150-150-200-10 MG TABS TABLET    Take 1 tablet by mouth daily with breakfast.   OMEPRAZOLE (PRILOSEC) 10 MG CAPSULE    Take 10 mg by mouth daily.  Modified Medications   No medications on file  Discontinued Medications   No medications on file    Subjective: Kathleen Savage Is in for her routine HIV follow-up visit. She has had no problems obtaining, taking or tolerating her Genvoya. She usually takes it each evening before bedtime with her other medications. This means that she often takes it on an empty stomach.. She goes to work each morning at 5 AM. 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 She has little time to exercise.   Review of Systems: Review of Systems  Constitutional: Positive for malaise/fatigue. Negative for chills, diaphoresis, fever and weight loss.  HENT: Negative for sore throat.   Respiratory: Negative for cough, sputum production and shortness of breath.   Cardiovascular: Negative for chest pain.  Gastrointestinal: Negative for abdominal pain, diarrhea, nausea  and vomiting.  Genitourinary: Negative for dysuria and frequency.  Musculoskeletal: Negative for joint pain and myalgias.  Skin: Negative for rash.  Neurological: Negative for dizziness and headaches.  Psychiatric/Behavioral: Negative for depression and substance abuse. The patient is not nervous/anxious.     Past Medical History:  Diagnosis Date  . Glucose intolerance (pre-diabetes)   . HIV infection ([place])   . Hyperlipidemia     Social History  Substance Use Topics  . Smoking status: Never Smoker  . Smokeless tobacco: Never Used  . Alcohol use No    Family History  Problem Relation Age of Onset  . Cancer Maternal Aunt     Breast Cancer  . Cancer Cousin     Breast cancer - maternal cousin  . Cancer Cousin     breast cancer  . Cancer Cousin     breast cancer  . Cancer Maternal Aunt     stomach cancer  . Stroke Father   . Cancer Sister     No Known Allergies  Objective:  Vitals:   07/22/16 0939  BP: 102/65  Pulse: 77  Temp: 98.3 F (36.8 C)  TempSrc: Oral  SpO2: 98%  Weight: 256 lb (116.1 kg)   Body mass index is 43.94 kg/m.  Physical Exam  Constitutional: She is oriented to person, place, and time.  She is smiling and in good spirits. Her BMI is unchanged at 43.  HENT:  Mouth/Throat: No oropharyngeal exudate.  Eyes: Conjunctivae are normal.  Cardiovascular: Normal rate and regular rhythm.   No murmur heard. Pulmonary/Chest: Breath sounds normal.  Abdominal: Soft. There is no tenderness.  Musculoskeletal: Normal range of motion. She exhibits no edema or tenderness.  Neurological: She is alert and oriented to person, place, and time.  Skin: No rash noted.  Psychiatric: Mood and affect normal.    Lab Results Lab Results  Component Value Date   WBC 4.7 01/29/2016   HGB 12.7 01/29/2016   HCT 37.0 01/29/2016   MCV 87.9 01/29/2016   PLT 309 01/29/2016    Lab Results  Component Value Date   CREATININE 0.62 01/29/2016   BUN 11 01/29/2016   NA  136 01/29/2016   K 4.0 01/29/2016   CL 104 01/29/2016   CO2 24 01/29/2016    Lab Results  Component Value Date   ALT 27 01/29/2016   AST 18 01/29/2016   ALKPHOS 46 01/29/2016   BILITOT 0.3 01/29/2016    Lab Results  Component Value Date   CHOL 230 (H) 01/29/2016   HDL 56 01/29/2016   LDLCALC 151 (H) 01/29/2016   TRIG 114 01/29/2016   CHOLHDL 4.1 01/29/2016   HIV 1 RNA Quant (copies/mL)  Date Value  07/08/2016 <20  01/29/2016 <20  07/26/2015 <20   CD4 T Cell Abs (/uL)  Date Value  07/08/2016 1,000  01/29/2016 730  07/26/2015 770     Problem List Items Addressed This Visit      High   Human immunodeficiency virus (HIV) disease ([place])    Her infection remains under excellent, long-term control. I asked her to take her Genvoya each evening with her dinner meal. She feels like she will be able to do this. She will follow-up after lab work in 6 months.       Other Visit Diagnoses   None.       Michel Bickers, MD [place] [facility] for [place] [facility] [phone] pager   [phone] cell 07/22/2016, 9:53 AM

## 2016-07-22 NOTE — Assessment & Plan Note (Signed)
Her infection remains under excellent, long-term control. I asked her to take her Genvoya each evening with her dinner meal. She feels like she will be able to do this. She will follow-up after lab work in 6 months.

## 2016-09-01 ENCOUNTER — Other Ambulatory Visit: Payer: Self-pay | Admitting: Internal Medicine

## 2016-09-01 DIAGNOSIS — B20 Human immunodeficiency virus [HIV] disease: Secondary | ICD-10-CM

## 2016-12-10 ENCOUNTER — Ambulatory Visit: Payer: Self-pay

## 2016-12-15 ENCOUNTER — Encounter: Payer: Self-pay | Admitting: Internal Medicine

## 2017-01-06 ENCOUNTER — Other Ambulatory Visit (INDEPENDENT_AMBULATORY_CARE_PROVIDER_SITE_OTHER): Payer: Self-pay

## 2017-01-06 DIAGNOSIS — Z79899 Other long term (current) drug therapy: Secondary | ICD-10-CM

## 2017-01-06 DIAGNOSIS — Z113 Encounter for screening for infections with a predominantly sexual mode of transmission: Secondary | ICD-10-CM

## 2017-01-06 DIAGNOSIS — B20 Human immunodeficiency virus [HIV] disease: Secondary | ICD-10-CM

## 2017-01-06 LAB — COMPREHENSIVE METABOLIC PANEL
ALK PHOS: 46 U/L (ref 33–115)
ALT: 29 U/L (ref 6–29)
AST: 19 U/L (ref 10–30)
Albumin: 3.9 g/dL (ref 3.6–5.1)
BUN: 14 mg/dL (ref 7–25)
CO2: 23 mmol/L (ref 20–31)
CREATININE: 0.77 mg/dL (ref 0.50–1.10)
Calcium: 8.6 mg/dL (ref 8.6–10.2)
Chloride: 103 mmol/L (ref 98–110)
Glucose, Bld: 151 mg/dL — ABNORMAL HIGH (ref 65–99)
POTASSIUM: 3.9 mmol/L (ref 3.5–5.3)
SODIUM: 136 mmol/L (ref 135–146)
TOTAL PROTEIN: 7.6 g/dL (ref 6.1–8.1)
Total Bilirubin: 0.3 mg/dL (ref 0.2–1.2)

## 2017-01-06 LAB — LIPID PANEL
CHOLESTEROL: 210 mg/dL — AB (ref <200)
HDL: 46 mg/dL — ABNORMAL LOW (ref >50)
LDL Cholesterol: 107 mg/dL — ABNORMAL HIGH (ref <100)
TRIGLYCERIDES: 284 mg/dL — AB (ref <150)
Total CHOL/HDL Ratio: 4.6 Ratio (ref <5.0)
VLDL: 57 mg/dL — ABNORMAL HIGH (ref <30)

## 2017-01-06 LAB — CBC
HCT: 38.1 % (ref 35.0–45.0)
Hemoglobin: 12.7 g/dL (ref 11.7–15.5)
MCH: 30 pg (ref 27.0–33.0)
MCHC: 33.3 g/dL (ref 32.0–36.0)
MCV: 90.1 fL (ref 80.0–100.0)
MPV: 9.2 fL (ref 7.5–12.5)
PLATELETS: 304 10*3/uL (ref 140–400)
RBC: 4.23 MIL/uL (ref 3.80–5.10)
RDW: 14.1 % (ref 11.0–15.0)
WBC: 5.6 10*3/uL (ref 3.8–10.8)

## 2017-01-07 LAB — RPR

## 2017-01-08 LAB — HIV-1 RNA QUANT-NO REFLEX-BLD
HIV 1 RNA QUANT: 25 {copies}/mL — AB
HIV-1 RNA QUANT, LOG: 1.4 {Log_copies}/mL — AB

## 2017-01-08 LAB — T-HELPER CELL (CD4) - (RCID CLINIC ONLY)
CD4 % Helper T Cell: 36 % (ref 33–55)
CD4 T CELL ABS: 650 /uL (ref 400–2700)

## 2017-01-20 ENCOUNTER — Ambulatory Visit: Payer: Self-pay | Admitting: Internal Medicine

## 2017-01-27 ENCOUNTER — Ambulatory Visit (INDEPENDENT_AMBULATORY_CARE_PROVIDER_SITE_OTHER): Payer: Self-pay | Admitting: Internal Medicine

## 2017-01-27 ENCOUNTER — Encounter: Payer: Self-pay | Admitting: Internal Medicine

## 2017-01-27 VITALS — BP 118/76 | HR 73 | Temp 98.6°F | Ht 62.0 in | Wt 259.0 lb

## 2017-01-27 DIAGNOSIS — B20 Human immunodeficiency virus [HIV] disease: Secondary | ICD-10-CM

## 2017-01-27 DIAGNOSIS — Z23 Encounter for immunization: Secondary | ICD-10-CM

## 2017-01-27 DIAGNOSIS — E785 Hyperlipidemia, unspecified: Secondary | ICD-10-CM

## 2017-01-27 NOTE — Assessment & Plan Note (Signed)
Her weight and BMI continue to increase dramatically putting her at risk for degenerative arthritis, diabetes and cardiovascular disease.

## 2017-01-27 NOTE — Assessment & Plan Note (Signed)
Her HIV viral load is just barely detectable at 25. She has been undetectable for many years and her adherence remains excellent. I talked to her again about the importance of taking her Genvoya consistently with food. It does not have to be a complete meal. She does not want to change to an alternate regimen which can be taken on an empty stomach. I believe that her risk of vertical transmission to a fetus is extremely low if she were to become pregnant. I asked her to notify me right away if she does become pregnant so that we can monitor her viral loads appropriately throughout pregnancy. Her husband will be tested here today. She will follow-up in 6 months

## 2017-01-27 NOTE — Assessment & Plan Note (Signed)
I talked to her again about the risk of metabolic syndrome and dyslipidemia.

## 2017-01-27 NOTE — Addendum Note (Signed)
Addended by: Landis Gandy on: 01/27/2017 05:45 PM   Modules accepted: Orders

## 2017-01-27 NOTE — Progress Notes (Signed)
Patient Active Problem List   Diagnosis Date Noted  . Human immunodeficiency virus (HIV) disease ([place]) 09/14/2006    Priority: High  . Elevated liver enzymes 11/29/2013    Priority: Medium  . Dyslipidemia 01/25/2013    Priority: Medium  . Hyperglycemia 01/25/2013    Priority: Medium  . Morbid obesity ([place]) 05/07/2010    Priority: Medium  . Rash 06/13/2014  . Multiple lipomas 01/25/2013  . LOW BACK PAIN, CHRONIC 11/28/2008  . ALOPECIA 08/03/2008  . 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 PAP SMEAR, ABNORMAL 12/01/2006    Patient's Medications  New Prescriptions   No medications on file  Previous Medications   CALCIUM CARBONATE (TUMS - DOSED IN MG ELEMENTAL CALCIUM) 500 MG CHEWABLE TABLET    Chew 1 tablet by mouth daily.   CLOTRIMAZOLE (LOTRIMIN) 1 % CREAM    Apply 1 application topically 2 (two) times daily.   GENVOYA 150-150-200-10 MG TABS TABLET    TAKE 1 TABLET BY MOUTH DAILY WITH BREAKFAST   OMEPRAZOLE (PRILOSEC) 10 MG CAPSULE    Take 10 mg by mouth daily.  Modified Medications   No medications on file  Discontinued Medications   No medications on file    Subjective: Kathleen Savage is in for her routine HIV visit with her husband. She's not had any problems obtaining, taking or tolerating her Genvoya. She recalls missing only 2 doses since her last visit 6 months ago. She does have difficulty consistently taking it with a full meal. She states that she takes it each evening as this is the only time when she can reliably remember to take it. She her husband are wanting to become pregnant again. They are currently using condoms consistently and are planning on artificial insemination with her husband as the donor. They have met with Dr. Royston Sinner to discuss this. He tested negative several years ago. She is not getting any regular exercise.   Review of Systems: Review of Systems  Constitutional: Negative for chills, diaphoresis, fever, malaise/fatigue and weight loss.  HENT:  Negative for sore throat.   Respiratory: Negative for cough, sputum production and shortness of breath.   Cardiovascular: Negative for chest pain.  Gastrointestinal: Negative for abdominal pain, diarrhea, heartburn, nausea and vomiting.  Genitourinary: Negative for dysuria and frequency.  Musculoskeletal: Negative for joint pain and myalgias.  Skin: Negative for rash.  Neurological: Negative for dizziness and headaches.  Psychiatric/Behavioral: Negative for depression and substance abuse. The patient is not nervous/anxious.     Past Medical History:  Diagnosis Date  . Glucose intolerance (pre-diabetes)   . HIV infection ([place])   . Hyperlipidemia     Social History  Substance Use Topics  . Smoking status: Never Smoker  . Smokeless tobacco: Never Used  . Alcohol use No    Family History  Problem Relation Age of Onset  . Cancer Maternal Aunt     Breast Cancer  . Cancer Cousin     Breast cancer - maternal cousin  . Cancer Cousin     breast cancer  . Cancer Cousin     breast cancer  . Cancer Maternal Aunt     stomach cancer  . Stroke Father   . Cancer Sister     No Known Allergies  Objective:  Vitals:   01/27/17 1022  Weight: 259 lb (117.5 kg)  Height: '5\' 2"'  (1.575 m)   Body mass index is 47.37 kg/m.  Physical Exam  Constitutional: She is oriented  to person, place, and time.  Her weight and BMI continue to climb steadily.  HENT:  Mouth/Throat: No oropharyngeal exudate.  Eyes: Conjunctivae are normal.  Cardiovascular: Normal rate and regular rhythm.   No murmur heard. Pulmonary/Chest: Effort normal and breath sounds normal. She has no wheezes. She has no rales.  Abdominal: Soft. She exhibits no mass. There is no tenderness.  Musculoskeletal: Normal range of motion.  Neurological: She is alert and oriented to person, place, and time.  Skin: No rash noted.  Psychiatric: Mood and affect normal.    Lab Results Lab Results  Component Value Date   WBC 5.6  01/06/2017   HGB 12.7 01/06/2017   HCT 38.1 01/06/2017   MCV 90.1 01/06/2017   PLT 304 01/06/2017    Lab Results  Component Value Date   CREATININE 0.77 01/06/2017   BUN 14 01/06/2017   NA 136 01/06/2017   K 3.9 01/06/2017   CL 103 01/06/2017   CO2 23 01/06/2017    Lab Results  Component Value Date   ALT 29 01/06/2017   AST 19 01/06/2017   ALKPHOS 46 01/06/2017   BILITOT 0.3 01/06/2017    Lab Results  Component Value Date   CHOL 210 (H) 01/06/2017   HDL 46 (L) 01/06/2017   LDLCALC 107 (H) 01/06/2017   TRIG 284 (H) 01/06/2017   CHOLHDL 4.6 01/06/2017   HIV 1 RNA Quant (copies/mL)  Date Value  01/06/2017 25 (H)  07/08/2016 <20  01/29/2016 <20   CD4 T Cell Abs (/uL)  Date Value  01/06/2017 650  07/08/2016 1,000  01/29/2016 730     Problem List Items Addressed This Visit      High   Human immunodeficiency virus (HIV) disease ([place])    Her HIV viral load is just barely detectable at 25. She has been undetectable for many years and her adherence remains excellent. I talked to her again about the importance of taking her Genvoya consistently with food. It does not have to be a complete meal. She does not want to change to an alternate regimen which can be taken on an empty stomach. I believe that her risk of vertical transmission to a fetus is extremely low if she were to become pregnant. I asked her to notify me right away if she does become pregnant so that we can monitor her viral loads appropriately throughout pregnancy. Her husband will be tested here today. She will follow-up in 6 months      Relevant Orders   T-helper cell (CD4)- ([facility] clinic only)   HIV 1 RNA quant-no reflex-bld     Medium   Dyslipidemia    I talked to her again about the risk of metabolic syndrome and dyslipidemia.      Morbid obesity ([place])    Her weight and BMI continue to increase dramatically putting her at risk for degenerative arthritis, diabetes and cardiovascular disease.            Michel Bickers, MD [facility] for [facility] [place] [facility] [phone] pager   [phone] cell 01/27/2017, 10:50 AM

## 2017-03-06 ENCOUNTER — Other Ambulatory Visit: Payer: Self-pay | Admitting: Internal Medicine

## 2017-03-06 DIAGNOSIS — B20 Human immunodeficiency virus [HIV] disease: Secondary | ICD-10-CM

## 2017-06-08 ENCOUNTER — Ambulatory Visit: Payer: Self-pay

## 2017-06-08 ENCOUNTER — Other Ambulatory Visit: Payer: Self-pay | Admitting: Internal Medicine

## 2017-06-08 DIAGNOSIS — B20 Human immunodeficiency virus [HIV] disease: Secondary | ICD-10-CM

## 2017-06-12 ENCOUNTER — Encounter: Payer: Self-pay | Admitting: Internal Medicine

## 2017-07-14 ENCOUNTER — Other Ambulatory Visit (INDEPENDENT_AMBULATORY_CARE_PROVIDER_SITE_OTHER): Payer: Self-pay

## 2017-07-14 DIAGNOSIS — B20 Human immunodeficiency virus [HIV] disease: Secondary | ICD-10-CM

## 2017-07-15 LAB — T-HELPER CELL (CD4) - (RCID CLINIC ONLY)
CD4 T CELL ABS: 1000 /uL (ref 400–2700)
CD4 T CELL HELPER: 39 % (ref 33–55)

## 2017-07-16 LAB — HIV-1 RNA QUANT-NO REFLEX-BLD
HIV 1 RNA QUANT: NOT DETECTED {copies}/mL
HIV-1 RNA Quant, Log: <1.3 Log copies/mL

## 2017-07-28 ENCOUNTER — Ambulatory Visit (INDEPENDENT_AMBULATORY_CARE_PROVIDER_SITE_OTHER): Payer: Self-pay | Admitting: Internal Medicine

## 2017-07-28 ENCOUNTER — Encounter: Payer: Self-pay | Admitting: Internal Medicine

## 2017-07-28 DIAGNOSIS — G8929 Other chronic pain: Secondary | ICD-10-CM

## 2017-07-28 DIAGNOSIS — B20 Human immunodeficiency virus [HIV] disease: Secondary | ICD-10-CM

## 2017-07-28 DIAGNOSIS — Z23 Encounter for immunization: Secondary | ICD-10-CM

## 2017-07-28 DIAGNOSIS — M5440 Lumbago with sciatica, unspecified side: Secondary | ICD-10-CM

## 2017-07-28 NOTE — Progress Notes (Signed)
Patient Active Problem List   Diagnosis Date Noted  . Human immunodeficiency virus (HIV) disease ([place]) 09/14/2006    Priority: High  . Elevated liver enzymes 11/29/2013    Priority: Medium  . Dyslipidemia 01/25/2013    Priority: Medium  . Hyperglycemia 01/25/2013    Priority: Medium  . Morbid obesity ([place]) 05/07/2010    Priority: Medium  . Rash 06/13/2014  . Multiple lipomas 01/25/2013  . LOW BACK PAIN, CHRONIC 11/28/2008  . ALOPECIA 08/03/2008  . 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 PAP SMEAR, ABNORMAL 12/01/2006    Patient's Medications  New Prescriptions   No medications on file  Previous Medications   CALCIUM CARBONATE (TUMS - DOSED IN MG ELEMENTAL CALCIUM) 500 MG CHEWABLE TABLET    Chew 1 tablet by mouth daily.   GENVOYA 150-150-200-10 MG TABS TABLET    TAKE 1 TABLET BY MOUTH DAILY WITH BREAKFAST   OMEPRAZOLE (PRILOSEC) 10 MG CAPSULE    Take 10 mg by mouth daily.  Modified Medications   No medications on file  Discontinued Medications   CLOMIPHENE (CLOMID) 50 MG TABLET    TAKE 1 TABLET BY MOUTH ON DAY THREE THROUGH SEVEN   CLOTRIMAZOLE (LOTRIMIN) 1 % CREAM    Apply 1 application topically 2 (two) times daily.   OVIDREL 250 MCG/0.5ML INJECTION    USE AS DIRECTED AFTER ULTRA SOUND    Subjective: Kathleen Savage is in with her husband for her routine HIV follow-up visit. She has had no problems obtaining, taking or tolerating her Genvoya and does not recall missing any doses. She has been exercising and trying to eat a healthier diet so she can lose some weight. For the last year she has had some pain in her right lower back that radiates down her posterior thigh. It can occur when she is standing at work or walking. It comes and goes. She husband are trying to get pregnant but she has had difficulty affording all of the ultrasounds that her obstetrician has been recommending.  Review of Systems: Review of Systems  Constitutional: Negative for chills, diaphoresis, fever,  malaise/fatigue and weight loss.  HENT: Negative for sore throat.   Respiratory: Negative for cough, sputum production and shortness of breath.   Cardiovascular: Negative for chest pain.  Gastrointestinal: Negative for abdominal pain, diarrhea, heartburn, nausea and vomiting.  Genitourinary: Negative for dysuria and frequency.  Musculoskeletal: Positive for back pain. Negative for joint pain and myalgias.  Skin: Negative for rash.  Neurological: Negative for dizziness and headaches.  Psychiatric/Behavioral: Negative for depression and substance abuse. The patient is not nervous/anxious.     Past Medical History:  Diagnosis Date  . Glucose intolerance (pre-diabetes)   . HIV infection ([place])   . Hyperlipidemia     Social History  Substance Use Topics  . Smoking status: Never Smoker  . Smokeless tobacco: Never Used  . Alcohol use No    Family History  Problem Relation Age of Onset  . Cancer Maternal Aunt        Breast Cancer  . Cancer Cousin        Breast cancer - maternal cousin  . Cancer Cousin        breast cancer  . Cancer Cousin        breast cancer  . Cancer Maternal Aunt        stomach cancer  . Stroke Father   . Cancer Sister     No Known Allergies  Objective:  Vitals:   07/28/17 0835  BP: 127/76  Pulse: 66  Temp: 98.5 F (36.9 C)  TempSrc: Oral  Weight: 250 lb (113.4 kg)  Height: 5\' 1"  (1.549 m)   Body mass index is 47.24 kg/m.  Physical Exam  Constitutional: She is oriented to person, place, and time.  HENT:  Mouth/Throat: No oropharyngeal exudate.  Eyes: Conjunctivae are normal.  Cardiovascular: Normal rate and regular rhythm.   No murmur heard. Pulmonary/Chest: Effort normal and breath sounds normal. She has no wheezes. She has no rales.  Abdominal: Soft. She exhibits no mass. There is no tenderness.  Musculoskeletal: Normal range of motion.  Neurological: She is alert and oriented to person, place, and time. Gait normal.  Skin: No rash  noted.  Psychiatric: Mood and affect normal.    Lab Results Lab Results  Component Value Date   WBC 5.6 01/06/2017   HGB 12.7 01/06/2017   HCT 38.1 01/06/2017   MCV 90.1 01/06/2017   PLT 304 01/06/2017    Lab Results  Component Value Date   CREATININE 0.77 01/06/2017   BUN 14 01/06/2017   NA 136 01/06/2017   K 3.9 01/06/2017   CL 103 01/06/2017   CO2 23 01/06/2017    Lab Results  Component Value Date   ALT 29 01/06/2017   AST 19 01/06/2017   ALKPHOS 46 01/06/2017   BILITOT 0.3 01/06/2017    Lab Results  Component Value Date   CHOL 210 (H) 01/06/2017   HDL 46 (L) 01/06/2017   LDLCALC 107 (H) 01/06/2017   TRIG 284 (H) 01/06/2017   CHOLHDL 4.6 01/06/2017   Lab Results  Component Value Date   LABRPR NON REAC 01/06/2017   HIV 1 RNA Quant (copies/mL)  Date Value  07/14/2017 <20 NOT DETECTED  01/06/2017 25 (H)  07/08/2016 <20   CD4 T Cell Abs (/uL)  Date Value  07/14/2017 1,000  01/06/2017 650  07/08/2016 1,000     Problem List Items Addressed This Visit      High   Human immunodeficiency virus (HIV) disease ([place])    Her infection is under excellent long-term control. She will continue Genvoya in follow-up after lab work in 6 months.      Relevant Orders   T-helper cell (CD4)- ([facility] clinic only)   HIV 1 RNA quant-no reflex-bld   CBC   Comprehensive metabolic panel   Lipid panel   RPR     Medium   Morbid obesity ([place])    She has been able to lose 9 pounds since her last visit. I encouraged her to continue her lifestyle modification.        Unprioritized   LOW BACK PAIN, CHRONIC    She has chronic low back pain and some degree of sciatica. I talked to her about stretching exercises that she can use to help relieve her pain. I also instructed her to use nonsteroidal anti-inflammatory medications as needed.           Michel Bickers, MD [facility] for [facility] [place] [facility] [phone] pager   [phone]  cell 07/28/2017, 8:59 AM

## 2017-07-28 NOTE — Assessment & Plan Note (Signed)
She has been able to lose 9 pounds since her last visit. I encouraged her to continue her lifestyle modification.

## 2017-07-28 NOTE — Assessment & Plan Note (Signed)
She has chronic low back pain and some degree of sciatica. I talked to her about stretching exercises that she can use to help relieve her pain. I also instructed her to use nonsteroidal anti-inflammatory medications as needed.

## 2017-07-28 NOTE — Assessment & Plan Note (Signed)
Her infection is under excellent long-term control. She will continue Genvoya in follow-up after lab work in 6 months.

## 2017-07-28 NOTE — Addendum Note (Signed)
Addended by: Laverle Patter on: 07/28/2017 10:18 AM   Modules accepted: Orders

## 2017-10-09 ENCOUNTER — Other Ambulatory Visit: Payer: Self-pay | Admitting: Internal Medicine

## 2017-10-09 DIAGNOSIS — B20 Human immunodeficiency virus [HIV] disease: Secondary | ICD-10-CM

## 2018-01-20 ENCOUNTER — Encounter: Payer: Self-pay | Admitting: Internal Medicine

## 2018-01-22 ENCOUNTER — Encounter (HOSPITAL_COMMUNITY): Payer: Self-pay

## 2018-01-22 ENCOUNTER — Emergency Department (HOSPITAL_COMMUNITY)
Admission: EM | Admit: 2018-01-22 | Discharge: 2018-01-22 | Disposition: A | Discharge status: Home / Self Care | Payer: Self-pay | Attending: Emergency Medicine | Admitting: Emergency Medicine

## 2018-01-22 DIAGNOSIS — G510 Bell's palsy: Secondary | ICD-10-CM | POA: Insufficient documentation

## 2018-01-22 DIAGNOSIS — Z79899 Other long term (current) drug therapy: Secondary | ICD-10-CM | POA: Insufficient documentation

## 2018-01-22 MED ORDER — ARTIFICIAL TEARS OPHTHALMIC OINT: TOPICAL_OINTMENT | Freq: Three times a day (TID) | OPHTHALMIC | 1 refills | Status: DC

## 2018-01-22 MED ORDER — METHYLPREDNISOLONE 4 MG PO TBPK: ORAL_TABLET | ORAL | 0 refills | Status: DC

## 2018-01-22 MED ORDER — VALACYCLOVIR HCL 1 G PO TABS: 1000.0000 mg | ORAL_TABLET | Freq: Three times a day (TID) | ORAL | 0 refills | Status: AC

## 2018-01-22 MED ORDER — PREDNISONE 20 MG PO TABS
60.0000 mg | ORAL_TABLET | Freq: Once | ORAL | Status: AC
  Administered 2018-01-22: 60 mg via ORAL
  Filled 2018-01-22: qty 3

## 2018-01-22 NOTE — ED Notes (Signed)
C/o pain behind her right ear. Drawing to left side of her face. Speech  Clear  Denies any weakness or numbness  In arms or legs.

## 2018-01-22 NOTE — Discharge Instructions (Signed)
Use lubricating drops three times per day, and before bed.  Call Neurologist to make out patient appointment as soon as possible

## 2018-01-22 NOTE — ED Triage Notes (Signed)
PT states this morning at about 0430 this morning she began having right sided facial numbness and tingling. PT endorses right posterior headache. No visual disturbances, unilateral weakness, drift, slurred speech, confusion. Paralysis of right side of face present. Dr. Jeneen Rinks in room assessing pt at this time.

## 2018-01-22 NOTE — ED Provider Notes (Signed)
[facility] EMERGENCY DEPARTMENT Provider Note   CSN: [id number] Arrival date & time: 01/22/18  1201     History   Chief Complaint Chief Complaint  Patient presents with  . Facial Droop    HPI Kathleen Savage is a 42 y.o. female.  Chief complaint is facial droop  HPI Kathleen Savage is a 42 year old female.  She works at [employer].  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  This morning upon getting to work she noticed that her "face felt funny".  She had difficulty drinking through a straw and noticed that her eye was not closing.  She has minimal discomfort behind her right ear.  No rash.  No vision changes no changes in hearing or noted changes in taste.  No difficulty with speech, ambulation, or strength.  History of glucose intolerance, HIV, hyperlipidemia, medicated for all 3.  Past Medical History:  Diagnosis Date  . Glucose intolerance (pre-diabetes)   . HIV infection ([place])   . Hyperlipidemia     Patient Active Problem List   Diagnosis Date Noted  . Rash 06/13/2014  . Elevated liver enzymes 11/29/2013  . Dyslipidemia 01/25/2013  . Hyperglycemia 01/25/2013  . Multiple lipomas 01/25/2013  . Morbid obesity ([place]) 05/07/2010  . LOW BACK PAIN, CHRONIC 11/28/2008  . ALOPECIA 08/03/2008  . 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 PAP SMEAR, ABNORMAL 12/01/2006  . Human immunodeficiency virus (HIV) disease ([place]) 09/14/2006    History reviewed. No pertinent surgical history.  OB History    No data available       Home Medications    Prior to Admission medications   Medication Sig Start Date End Date Taking? Authorizing Provider  artificial tears (LACRILUBE) OINT ophthalmic ointment Place into both eyes 3 (three) times daily. 01/22/18   Tanna Furry, MD  calcium carbonate (TUMS - DOSED IN MG ELEMENTAL CALCIUM) 500 MG chewable tablet Chew 1 tablet by mouth daily.    [provider]  GENVOYA 150-150-200-10 MG TABS tablet TAKE 1 TABLET BY MOUTH DAILY  WITH BREAKFAST 10/09/17   Michel Bickers, MD  methylPREDNISolone (MEDROL DOSEPAK) 4 MG TBPK tablet 6 po on day 1, decrease by 1 tab per day 01/22/18   Tanna Furry, MD  omeprazole (PRILOSEC) 10 MG capsule Take 10 mg by mouth daily.    [provider]  valACYclovir (VALTREX) 1000 MG tablet Take 1 tablet (1,000 mg total) by mouth 3 (three) times daily for 7 days. 01/22/18 01/29/18  Tanna Furry, MD    Family History Family History  Problem Relation Age of Onset  . Cancer Maternal Aunt        Breast Cancer  . Cancer Cousin        Breast cancer - maternal cousin  . Cancer Cousin        breast cancer  . Cancer Cousin        breast cancer  . Cancer Maternal Aunt        stomach cancer  . Stroke Father   . Cancer Sister     Social History Social History   Tobacco Use  . Smoking status: Never Smoker  . Smokeless tobacco: Never Used  Substance Use Topics  . Alcohol use: No  . Drug use: No     Allergies   Patient has no known allergies.   Review of Systems Review of Systems  Constitutional: Negative for appetite change, chills, diaphoresis, fatigue and fever.  HENT: Negative for mouth sores, sore throat and trouble  swallowing.   Eyes: Negative for visual disturbance.  Respiratory: Negative for cough, chest tightness, shortness of breath and wheezing.   Cardiovascular: Negative for chest pain.  Gastrointestinal: Negative for abdominal distention, abdominal pain, diarrhea, nausea and vomiting.  Endocrine: Negative for polydipsia, polyphagia and polyuria.  Genitourinary: Negative for dysuria, frequency and hematuria.  Musculoskeletal: Negative for gait problem.  Skin: Negative for color change, pallor and rash.  Neurological: Positive for facial asymmetry. Negative for dizziness, syncope, light-headedness and headaches.  Hematological: Does not bruise/bleed easily.  Psychiatric/Behavioral: Negative for behavioral problems and confusion.     Physical Exam Updated Vital  Signs BP 124/61   Pulse 80   Temp 98.5 F (36.9 C) (Oral)   Resp 16   SpO2 98%   Physical Exam  Constitutional: She is oriented to person, place, and time. She appears well-developed and well-nourished. No distress.  HENT:  Head: Normocephalic.  Eyes: Conjunctivae are normal. Pupils are equal, round, and reactive to light. No scleral icterus.  Neck: Normal range of motion. Neck supple. No thyromegaly present.  Cardiovascular: Normal rate and regular rhythm. Exam reveals no gallop and no friction rub.  No murmur heard. Pulmonary/Chest: Effort normal and breath sounds normal. No respiratory distress. She has no wheezes. She has no rales.  Abdominal: Soft. Bowel sounds are normal. She exhibits no distension. There is no tenderness. There is no rebound.  Musculoskeletal: Normal range of motion.  Neurological: She is alert and oriented to person, place, and time.  Facial weakness upper, and lower.  Consistent with peripheral right 7th nerve palsy.  No vesicles over the scalp.  Normal TMs.  Normal appearing cornea.  No pronator drift, no leg drift.  Other cranial nerves intact symmetric.  Skin: Skin is warm and dry. No rash noted.  Psychiatric: She has a normal mood and affect. Her behavior is normal.     ED Treatments / Results  Labs (all labs ordered are listed, but only abnormal results are displayed) Labs Reviewed - No data to display  EKG  EKG Interpretation None       Radiology No results found.  Procedures Procedures (including critical care time)  Medications Ordered in ED Medications  predniSONE (DELTASONE) tablet 60 mg (60 mg Oral Given 01/22/18 1333)     Initial Impression / Assessment and Plan / ED Course  I have reviewed the triage vital signs and the nursing notes.  Pertinent labs & imaging results that were available during my care of the patient were reviewed by me and considered in my medical decision making (see chart for details).     Tubes and  findings consistent with acute peripheral right 7th nerve palsy.  We will give Valtrex and prednisone.  Lacri-Lube drops.  Neurology follow-up if not improving.  Final Clinical Impressions(s) / ED Diagnoses   Final diagnoses:  Bell's palsy    ED Discharge Orders        Ordered    valACYclovir (VALTREX) 1000 MG tablet  3 times daily     01/22/18 1324    methylPREDNISolone (MEDROL DOSEPAK) 4 MG TBPK tablet     01/22/18 1324    artificial tears (LACRILUBE) OINT ophthalmic ointment  3 times daily     01/22/18 1324       Tanna Furry, MD 01/22/18 1627

## 2018-01-26 ENCOUNTER — Other Ambulatory Visit: Payer: Self-pay

## 2018-01-26 DIAGNOSIS — B20 Human immunodeficiency virus [HIV] disease: Secondary | ICD-10-CM

## 2018-01-27 LAB — COMPREHENSIVE METABOLIC PANEL
AG RATIO: 1.2 (calc) (ref 1.0–2.5)
ALBUMIN MSPROF: 4.1 g/dL (ref 3.6–5.1)
ALT: 19 U/L (ref 6–29)
AST: 10 U/L (ref 10–30)
Alkaline phosphatase (APISO): 41 U/L (ref 33–115)
BILIRUBIN TOTAL: 0.3 mg/dL (ref 0.2–1.2)
BUN: 14 mg/dL (ref 7–25)
CALCIUM: 8.9 mg/dL (ref 8.6–10.2)
CHLORIDE: 103 mmol/L (ref 98–110)
CO2: 25 mmol/L (ref 20–32)
Creat: 0.62 mg/dL (ref 0.50–1.10)
GLOBULIN: 3.4 g/dL (ref 1.9–3.7)
Glucose, Bld: 123 mg/dL — ABNORMAL HIGH (ref 65–99)
POTASSIUM: 4.4 mmol/L (ref 3.5–5.3)
SODIUM: 136 mmol/L (ref 135–146)
TOTAL PROTEIN: 7.5 g/dL (ref 6.1–8.1)

## 2018-01-27 LAB — CBC
HCT: 39.7 % (ref 35.0–45.0)
Hemoglobin: 13.5 g/dL (ref 11.7–15.5)
MCH: 29.7 pg (ref 27.0–33.0)
MCHC: 34 g/dL (ref 32.0–36.0)
MCV: 87.3 fL (ref 80.0–100.0)
MPV: 9.9 fL (ref 7.5–12.5)
PLATELETS: 387 10*3/uL (ref 140–400)
RBC: 4.55 10*6/uL (ref 3.80–5.10)
RDW: 12.8 % (ref 11.0–15.0)
WBC: 13.1 10*3/uL — ABNORMAL HIGH (ref 3.8–10.8)

## 2018-01-27 LAB — LIPID PANEL
CHOLESTEROL: 241 mg/dL — AB (ref <200)
HDL: 63 mg/dL (ref >50)
LDL CHOLESTEROL (CALC): 146 mg/dL — AB
Non-HDL Cholesterol (Calc): 178 mg/dL (calc) — ABNORMAL HIGH (ref <130)
Total CHOL/HDL Ratio: 3.8 (calc) (ref <5.0)
Triglycerides: 185 mg/dL — ABNORMAL HIGH (ref <150)

## 2018-01-27 LAB — RPR: RPR: NONREACTIVE

## 2018-01-27 LAB — T-HELPER CELL (CD4) - (RCID CLINIC ONLY)
CD4 % Helper T Cell: 24 % — ABNORMAL LOW (ref 33–55)
CD4 T CELL ABS: 680 /uL (ref 400–2700)

## 2018-01-29 LAB — HIV-1 RNA QUANT-NO REFLEX-BLD
HIV 1 RNA QUANT: DETECTED {copies}/mL — AB
HIV-1 RNA Quant, Log: <1.3 Log copies/mL — AB

## 2018-02-09 ENCOUNTER — Encounter: Payer: Self-pay | Admitting: Internal Medicine

## 2018-02-09 ENCOUNTER — Ambulatory Visit (INDEPENDENT_AMBULATORY_CARE_PROVIDER_SITE_OTHER): Payer: Self-pay | Admitting: Internal Medicine

## 2018-02-09 DIAGNOSIS — B20 Human immunodeficiency virus [HIV] disease: Secondary | ICD-10-CM

## 2018-02-09 DIAGNOSIS — G510 Bell's palsy: Secondary | ICD-10-CM | POA: Insufficient documentation

## 2018-02-09 MED ORDER — PREGABALIN 50 MG PO CAPS: 50.0000 mg | ORAL_CAPSULE | Freq: Every day | ORAL | 3 refills | Status: DC

## 2018-02-09 NOTE — Progress Notes (Signed)
Patient Active Problem List   Diagnosis Date Noted  . Human immunodeficiency virus (HIV) disease ([place]) 09/14/2006    Priority: High  . Bell's palsy 02/09/2018    Priority: Medium  . Elevated liver enzymes 11/29/2013    Priority: Medium  . Dyslipidemia 01/25/2013    Priority: Medium  . Hyperglycemia 01/25/2013    Priority: Medium  . Morbid obesity ([place]) 05/07/2010    Priority: Medium  . Rash 06/13/2014  . Multiple lipomas 01/25/2013  . LOW BACK PAIN, CHRONIC 11/28/2008  . ALOPECIA 08/03/2008  . 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 PAP SMEAR, ABNORMAL 12/01/2006    Patient's Medications  New Prescriptions   PREGABALIN (LYRICA) 50 MG CAPSULE    Take 1 capsule (50 mg total) by mouth at bedtime.  Previous Medications   ARTIFICIAL TEARS (LACRILUBE) OINT OPHTHALMIC OINTMENT    Place into both eyes 3 (three) times daily.   CALCIUM CARBONATE (TUMS - DOSED IN MG ELEMENTAL CALCIUM) 500 MG CHEWABLE TABLET    Chew 1 tablet by mouth daily.   GENVOYA 150-150-200-10 MG TABS TABLET    TAKE 1 TABLET BY MOUTH DAILY WITH BREAKFAST   METHYLPREDNISOLONE (MEDROL DOSEPAK) 4 MG TBPK TABLET    6 po on day 1, decrease by 1 tab per day   OMEPRAZOLE (PRILOSEC) 10 MG CAPSULE    Take 10 mg by mouth daily.  Modified Medications   No medications on file  Discontinued Medications   No medications on file    Subjective: Kathleen Savage is in with her husband for her routine follow-up visit.  3 weeks ago she woke up with pain in her right ear.  Over the course of the day she developed right facial paralysis.  She is also had altered taste sensation.  She was seen in the emergency department and given prescriptions for valacyclovir and prednisone.  She took both but has not noted any improvement in the paralysis or her pain.  She has not been able to work because she cannot use her contacts and cannot see to drive.  She has been taking ibuprofen and acetaminophen but finds that this does not do very much for her  pain.  She has not had any problems obtaining, taking or tolerating her Genvoya.  She does not recall missing doses.  She does not get any regular exercise.  Review of Systems: Review of Systems  Constitutional: Negative for chills, diaphoresis, fever, malaise/fatigue and weight loss.  HENT: Positive for ear pain. Negative for congestion and sore throat.   Respiratory: Negative for cough, sputum production and shortness of breath.   Cardiovascular: Negative for chest pain.  Gastrointestinal: Negative for abdominal pain, diarrhea, heartburn, nausea and vomiting.  Genitourinary: Negative for dysuria and frequency.  Musculoskeletal: Negative for joint pain and myalgias.  Skin: Negative for rash.  Neurological: Positive for focal weakness and headaches. Negative for dizziness.  Psychiatric/Behavioral: Negative for depression and substance abuse. The patient is not nervous/anxious.     Past Medical History:  Diagnosis Date  . Glucose intolerance (pre-diabetes)   . HIV infection ([place])   . Hyperlipidemia     Social History   Tobacco Use  . Smoking status: Never Smoker  . Smokeless tobacco: Never Used  Substance Use Topics  . Alcohol use: No  . Drug use: No    Family History  Problem Relation Age of Onset  . Cancer Maternal Aunt        Breast Cancer  .  Cancer Cousin        Breast cancer - maternal cousin  . Cancer Cousin        breast cancer  . Cancer Cousin        breast cancer  . Cancer Maternal Aunt        stomach cancer  . Stroke Father   . Cancer Sister     No Known Allergies  Health Maintenance  Topic Date Due  . TETANUS/TDAP  07/20/1996  . PAP SMEAR  02/09/2016  . INFLUENZA VACCINE  06/10/2018  . HIV Screening  Completed    Objective:  Vitals:   02/09/18 0842  BP: 123/79  Pulse: 73  Temp: 98.7 F (37.1 C)  TempSrc: Oral  Weight: 257 lb (116.6 kg)   Body mass index is 48.56 kg/m.  Physical Exam  Constitutional: She is oriented to person, place,  and time. No distress.  She appears concerned and is uncomfortable due to pain.  HENT:  Mouth/Throat: No oropharyngeal exudate.  She has some wax in both external canals.  No vesicles are noted.  TMs are clear bilaterally.  Eyes: Conjunctivae are normal.  Neck: Neck supple.  Cardiovascular: Normal rate and regular rhythm.  No murmur heard. Pulmonary/Chest: Effort normal and breath sounds normal.  Abdominal: Soft. She exhibits no mass. There is no tenderness.  Musculoskeletal: Normal range of motion.  Neurological: She is alert and oriented to person, place, and time.  Skin: No rash noted.  Psychiatric: Mood and affect normal.    Lab Results Lab Results  Component Value Date   WBC 13.1 (H) 01/26/2018   HGB 13.5 01/26/2018   HCT 39.7 01/26/2018   MCV 87.3 01/26/2018   PLT 387 01/26/2018    Lab Results  Component Value Date   CREATININE 0.62 01/26/2018   BUN 14 01/26/2018   NA 136 01/26/2018   K 4.4 01/26/2018   CL 103 01/26/2018   CO2 25 01/26/2018    Lab Results  Component Value Date   ALT 19 01/26/2018   AST 10 01/26/2018   ALKPHOS 46 01/06/2017   BILITOT 0.3 01/26/2018    Lab Results  Component Value Date   CHOL 241 (H) 01/26/2018   HDL 63 01/26/2018   LDLCALC 146 (H) 01/26/2018   TRIG 185 (H) 01/26/2018   CHOLHDL 3.8 01/26/2018   Lab Results  Component Value Date   LABRPR NON-REACTIVE 01/26/2018   HIV 1 RNA Quant (copies/mL)  Date Value  01/26/2018 <20 DETECTED (A)  07/14/2017 <20 NOT DETECTED  01/06/2017 25 (H)   CD4 T Cell Abs (/uL)  Date Value  01/26/2018 680  07/14/2017 1,000  01/06/2017 650     Problem List Items Addressed This Visit      High   Human immunodeficiency virus (HIV) disease ([place])    Her infection remains under excellent, long-term control.  She will continue Genvoya.        Medium   Bell's palsy    She has developed idiopathic right Bell's palsy.  She is using eyedrops for lubrication.  I will start her on pregabalin  and see her back in 2 weeks.      Relevant Medications   pregabalin (LYRICA) 50 MG capsule   Morbid obesity ([place])    Her weight continues to go up and she remains hyperglycemic.           Michel Bickers, MD [facility] [phone] pager   [phone]  cell 02/09/2018, 9:03 AM

## 2018-02-09 NOTE — Assessment & Plan Note (Signed)
Her infection remains under excellent, long-term control.  She will continue Genvoya.

## 2018-02-09 NOTE — Assessment & Plan Note (Signed)
Her weight continues to go up and she remains hyperglycemic.

## 2018-02-09 NOTE — Assessment & Plan Note (Signed)
She has developed idiopathic right Bell's palsy.  She is using eyedrops for lubrication.  I will start her on pregabalin and see her back in 2 weeks.

## 2018-04-13 ENCOUNTER — Ambulatory Visit (INDEPENDENT_AMBULATORY_CARE_PROVIDER_SITE_OTHER): Payer: Self-pay | Admitting: Internal Medicine

## 2018-04-13 ENCOUNTER — Encounter: Payer: Self-pay | Admitting: Internal Medicine

## 2018-04-13 DIAGNOSIS — G510 Bell's palsy: Secondary | ICD-10-CM

## 2018-04-13 NOTE — Progress Notes (Signed)
Patient Active Problem List   Diagnosis Date Noted  . Human immunodeficiency virus (HIV) disease ([place]) 09/14/2006    Priority: High  . Bell's palsy 02/09/2018    Priority: Medium  . Elevated liver enzymes 11/29/2013    Priority: Medium  . Dyslipidemia 01/25/2013    Priority: Medium  . Hyperglycemia 01/25/2013    Priority: Medium  . Morbid obesity ([place]) 05/07/2010    Priority: Medium  . Rash 06/13/2014  . Multiple lipomas 01/25/2013  . LOW BACK PAIN, CHRONIC 11/28/2008  . ALOPECIA 08/03/2008  . 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 PAP SMEAR, ABNORMAL 12/01/2006    Patient's Medications  New Prescriptions   No medications on file  Previous Medications   ARTIFICIAL TEARS (LACRILUBE) OINT OPHTHALMIC OINTMENT    Place into both eyes 3 (three) times daily.   CALCIUM CARBONATE (TUMS - DOSED IN MG ELEMENTAL CALCIUM) 500 MG CHEWABLE TABLET    Chew 1 tablet by mouth daily.   GENVOYA 150-150-200-10 MG TABS TABLET    TAKE 1 TABLET BY MOUTH DAILY WITH BREAKFAST   PREGABALIN (LYRICA) 50 MG CAPSULE    Take 1 capsule (50 mg total) by mouth at bedtime.  Modified Medications   No medications on file  Discontinued Medications   METHYLPREDNISOLONE (MEDROL DOSEPAK) 4 MG TBPK TABLET    6 po on day 1, decrease by 1 tab per day   OMEPRAZOLE (PRILOSEC) 10 MG CAPSULE    Take 10 mg by mouth daily.    Subjective: Kathleen Savage is in with her husband for her routine follow-up visit.  2-1/2 months ago she woke up with pain in her right ear.  Over the course of the day she developed right facial paralysis.  She is also had altered taste sensation.  She was seen in the emergency department and given prescriptions for valacyclovir and prednisone.  She took both but has not noted any improvement in the paralysis.  Her pain has resolved.  She never started on pregabalin.  She has not been able to work because she cannot use her contacts and cannot see to drive.  She continues to use eyedrops daily.  She has not  had any problems obtaining, taking or tolerating her Genvoya.  She does not recall missing doses.  She does not get any regular exercise.  Review of Systems: Review of Systems  Constitutional: Negative for chills, diaphoresis, fever, malaise/fatigue and weight loss.  HENT: Negative for congestion, ear pain and sore throat.   Respiratory: Negative for cough, sputum production and shortness of breath.   Cardiovascular: Negative for chest pain.  Gastrointestinal: Negative for abdominal pain, diarrhea, heartburn, nausea and vomiting.  Genitourinary: Negative for dysuria and frequency.  Musculoskeletal: Negative for joint pain and myalgias.  Skin: Negative for rash.  Neurological: Positive for focal weakness. Negative for dizziness and headaches.  Psychiatric/Behavioral: Negative for depression and substance abuse. The patient is not nervous/anxious.     Past Medical History:  Diagnosis Date  . Glucose intolerance (pre-diabetes)   . HIV infection ([place])   . Hyperlipidemia     Social History   Tobacco Use  . Smoking status: Never Smoker  . Smokeless tobacco: Never Used  Substance Use Topics  . Alcohol use: No  . Drug use: No    Family History  Problem Relation Age of Onset  . Cancer Maternal Aunt        Breast Cancer  . Cancer Cousin  Breast cancer - maternal cousin  . Cancer Cousin        breast cancer  . Cancer Cousin        breast cancer  . Cancer Maternal Aunt        stomach cancer  . Stroke Father   . Cancer Sister     No Known Allergies  Health Maintenance  Topic Date Due  . TETANUS/TDAP  07/20/1996  . PAP SMEAR  02/09/2016  . INFLUENZA VACCINE  06/10/2018  . HIV Screening  Completed    Objective:  Vitals:   04/13/18 0846  BP: 110/74  Pulse: 80  Temp: 98.2 F (36.8 C)  TempSrc: Oral  Weight: 252 lb (114.3 kg)  Height: 5\' 1"  (1.549 m)   Body mass index is 47.61 kg/m.  Physical Exam  Constitutional: She is oriented to person, place, and  time. No distress.  She is in good spirits despite continued right facial paralysis.  HENT:  Mouth/Throat: No oropharyngeal exudate.  She has some wax in both external canals.  No vesicles are noted.  TMs are clear bilaterally.  Eyes: Conjunctivae are normal.  Neck: Neck supple.  Cardiovascular: Normal rate and regular rhythm.  No murmur heard. Pulmonary/Chest: Effort normal and breath sounds normal.  Abdominal: Soft. She exhibits no mass. There is no tenderness.  Musculoskeletal: Normal range of motion.  Neurological: She is alert and oriented to person, place, and time.  Skin: No rash noted.  Psychiatric: Mood and affect normal.    Lab Results Lab Results  Component Value Date   WBC 13.1 (H) 01/26/2018   HGB 13.5 01/26/2018   HCT 39.7 01/26/2018   MCV 87.3 01/26/2018   PLT 387 01/26/2018    Lab Results  Component Value Date   CREATININE 0.62 01/26/2018   BUN 14 01/26/2018   NA 136 01/26/2018   K 4.4 01/26/2018   CL 103 01/26/2018   CO2 25 01/26/2018    Lab Results  Component Value Date   ALT 19 01/26/2018   AST 10 01/26/2018   ALKPHOS 46 01/06/2017   BILITOT 0.3 01/26/2018    Lab Results  Component Value Date   CHOL 241 (H) 01/26/2018   HDL 63 01/26/2018   LDLCALC 146 (H) 01/26/2018   TRIG 185 (H) 01/26/2018   CHOLHDL 3.8 01/26/2018   Lab Results  Component Value Date   LABRPR NON-REACTIVE 01/26/2018   HIV 1 RNA Quant (copies/mL)  Date Value  01/26/2018 <20 DETECTED (A)  07/14/2017 <20 NOT DETECTED  01/06/2017 25 (H)   CD4 T Cell Abs (/uL)  Date Value  01/26/2018 680  07/14/2017 1,000  01/06/2017 650     Problem List Items Addressed This Visit      Medium   Bell's palsy    Other than resolution of her pain she has had no improvement in her right facial paralysis.  I will see her back in 2 months and consider brain imaging.           Michel Bickers, MD [facility] [phone] pager    [phone] cell 04/13/2018, 9:03 AM

## 2018-04-13 NOTE — Assessment & Plan Note (Signed)
Other than resolution of her pain she has had no improvement in her right facial paralysis.  I will see her back in 2 months and consider brain imaging.

## 2018-04-18 ENCOUNTER — Other Ambulatory Visit: Payer: Self-pay | Admitting: Internal Medicine

## 2018-04-18 DIAGNOSIS — B20 Human immunodeficiency virus [HIV] disease: Secondary | ICD-10-CM

## 2018-06-15 ENCOUNTER — Ambulatory Visit (INDEPENDENT_AMBULATORY_CARE_PROVIDER_SITE_OTHER): Payer: Self-pay | Admitting: Internal Medicine

## 2018-06-15 ENCOUNTER — Encounter: Payer: Self-pay | Admitting: Internal Medicine

## 2018-06-15 DIAGNOSIS — G510 Bell's palsy: Secondary | ICD-10-CM

## 2018-06-15 DIAGNOSIS — B20 Human immunodeficiency virus [HIV] disease: Secondary | ICD-10-CM

## 2018-06-15 NOTE — Progress Notes (Signed)
Patient Active Problem List   Diagnosis Date Noted  . Human immunodeficiency virus (HIV) disease ([place]) 09/14/2006    Priority: High  . Bell's palsy 02/09/2018    Priority: Medium  . Elevated liver enzymes 11/29/2013    Priority: Medium  . Dyslipidemia 01/25/2013    Priority: Medium  . Hyperglycemia 01/25/2013    Priority: Medium  . Morbid obesity ([place]) 05/07/2010    Priority: Medium  . Rash 06/13/2014  . Multiple lipomas 01/25/2013  . LOW BACK PAIN, CHRONIC 11/28/2008  . ALOPECIA 08/03/2008  . 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 PAP SMEAR, ABNORMAL 12/01/2006    Patient's Medications  New Prescriptions   No medications on file  Previous Medications   CALCIUM CARBONATE (TUMS - DOSED IN MG ELEMENTAL CALCIUM) 500 MG CHEWABLE TABLET    Chew 1 tablet by mouth daily.   GENVOYA 150-150-200-10 MG TABS TABLET    TAKE 1 TABLET BY MOUTH DAILY WITH BREAKFAST  Modified Medications   No medications on file  Discontinued Medications   ARTIFICIAL TEARS (LACRILUBE) OINT OPHTHALMIC OINTMENT    Place into both eyes 3 (three) times daily.   PREGABALIN (LYRICA) 50 MG CAPSULE    Take 1 capsule (50 mg total) by mouth at bedtime.    Subjective: Kathleen Savage is in for her routine HIV follow-up visit.  She says that she missed 1 dose of her Genvoya about 1 month ago when her husband gave her the last pill in the bottle and failed to tell her that she was out.  She missed the following day before she could get a refill.  She is having no problems tolerating it.  She has noted steady improvement in her right Bell's palsy over the past 2 months.  She is no longer taking the Lyrica.  She is not having any pain.  She is now able to close her right eye.  She is not needing eyedrops.  She has returned to work.  Review of Systems: Review of Systems  Constitutional: Negative for chills, diaphoresis, fever, malaise/fatigue and weight loss.  HENT: Negative for sore throat.   Respiratory: Negative for cough,  sputum production and shortness of breath.   Cardiovascular: Negative for chest pain.  Gastrointestinal: Negative for abdominal pain, diarrhea, heartburn, nausea and vomiting.  Genitourinary: Negative for dysuria and frequency.  Musculoskeletal: Negative for joint pain and myalgias.  Skin: Negative for rash.  Neurological: Negative for dizziness and headaches.       As noted in HPI.    Past Medical History:  Diagnosis Date  . Glucose intolerance (pre-diabetes)   . HIV infection ([place])   . Hyperlipidemia     Social History   Tobacco Use  . Smoking status: Never Smoker  . Smokeless tobacco: Never Used  Substance Use Topics  . Alcohol use: No  . Drug use: No    Family History  Problem Relation Age of Onset  . Cancer Maternal Aunt        Breast Cancer  . Cancer Cousin        Breast cancer - maternal cousin  . Cancer Cousin        breast cancer  . Cancer Cousin        breast cancer  . Cancer Maternal Aunt        stomach cancer  . Stroke Father   . Cancer Sister     No Known Allergies  Health Maintenance  Topic Date Due  .  TETANUS/TDAP  07/20/1996  . PAP SMEAR  02/09/2016  . INFLUENZA VACCINE  06/10/2018  . HIV Screening  Completed    Objective:  Vitals:   06/15/18 0933  BP: 123/80  Pulse: 77  Temp: 98.4 F (36.9 C)  Weight: 250 lb (113.4 kg)  Height: 5\' 5"  (1.651 m)   Body mass index is 41.6 kg/m.  Physical Exam  Constitutional: She is oriented to person, place, and time.  She is in much better spirits today.  HENT:  Mouth/Throat: No oropharyngeal exudate.  Eyes: Conjunctivae are normal.  Cardiovascular: Normal rate and regular rhythm.  No murmur heard. Pulmonary/Chest: Breath sounds normal.  Abdominal: Soft. She exhibits no mass. There is no tenderness.  Musculoskeletal: Normal range of motion.  Neurological: She is alert and oriented to person, place, and time.  She has had significant improvement in her right facial weakness.  She still has  some right ptosis but is able to completely close her right eye.  Skin: No rash noted.  Psychiatric: She has a normal mood and affect.    Lab Results Lab Results  Component Value Date   WBC 13.1 (H) 01/26/2018   HGB 13.5 01/26/2018   HCT 39.7 01/26/2018   MCV 87.3 01/26/2018   PLT 387 01/26/2018    Lab Results  Component Value Date   CREATININE 0.62 01/26/2018   BUN 14 01/26/2018   NA 136 01/26/2018   K 4.4 01/26/2018   CL 103 01/26/2018   CO2 25 01/26/2018    Lab Results  Component Value Date   ALT 19 01/26/2018   AST 10 01/26/2018   ALKPHOS 46 01/06/2017   BILITOT 0.3 01/26/2018    Lab Results  Component Value Date   CHOL 241 (H) 01/26/2018   HDL 63 01/26/2018   LDLCALC 146 (H) 01/26/2018   TRIG 185 (H) 01/26/2018   CHOLHDL 3.8 01/26/2018   Lab Results  Component Value Date   LABRPR NON-REACTIVE 01/26/2018   HIV 1 RNA Quant (copies/mL)  Date Value  01/26/2018 <20 DETECTED (A)  07/14/2017 <20 NOT DETECTED  01/06/2017 25 (H)   CD4 T Cell Abs (/uL)  Date Value  01/26/2018 680  07/14/2017 1,000  01/06/2017 650     Problem List Items Addressed This Visit      High   Human immunodeficiency virus (HIV) disease ([place])    Her infection is under excellent, long-term control.  She will continue Genvoya and follow-up after lab work in 3 months.      Relevant Orders   T-helper cell (CD4)- ([facility] clinic only)   HIV 1 RNA quant-no reflex-bld     Medium   Bell's palsy    She is finally showing improvement in her Bell's palsy.  She will follow-up in 3 months.           Michel Bickers, MD [facility] for Infectious [place] [facility] [phone] pager   [phone] cell 06/15/2018, 9:50 AM

## 2018-06-15 NOTE — Assessment & Plan Note (Signed)
Her infection is under excellent, long-term control.  She will continue Genvoya and follow-up after lab work in 3 months.

## 2018-06-15 NOTE — Assessment & Plan Note (Signed)
She is finally showing improvement in her Bell's palsy.  She will follow-up in 3 months.

## 2018-07-13 ENCOUNTER — Ambulatory Visit: Payer: Self-pay

## 2018-08-17 ENCOUNTER — Ambulatory Visit: Payer: Self-pay

## 2018-08-17 ENCOUNTER — Encounter: Payer: Self-pay | Admitting: Internal Medicine

## 2018-08-31 ENCOUNTER — Other Ambulatory Visit: Payer: Self-pay

## 2018-08-31 DIAGNOSIS — B20 Human immunodeficiency virus [HIV] disease: Secondary | ICD-10-CM

## 2018-09-01 LAB — T-HELPER CELL (CD4) - (RCID CLINIC ONLY)
CD4 % Helper T Cell: 38 % (ref 33–55)
CD4 T CELL ABS: 740 /uL (ref 400–2700)

## 2018-09-02 LAB — HIV-1 RNA QUANT-NO REFLEX-BLD
HIV 1 RNA Quant: <20 copies/mL
HIV-1 RNA QUANT, LOG: NOT DETECTED {Log_copies}/mL

## 2018-09-14 ENCOUNTER — Encounter: Payer: Self-pay | Admitting: Internal Medicine

## 2018-09-14 ENCOUNTER — Ambulatory Visit (INDEPENDENT_AMBULATORY_CARE_PROVIDER_SITE_OTHER): Payer: Self-pay | Admitting: Internal Medicine

## 2018-09-14 VITALS — BP 132/81 | HR 80 | Temp 97.9°F | Ht 62.0 in | Wt 252.0 lb

## 2018-09-14 DIAGNOSIS — B20 Human immunodeficiency virus [HIV] disease: Secondary | ICD-10-CM

## 2018-09-14 DIAGNOSIS — G510 Bell's palsy: Secondary | ICD-10-CM

## 2018-09-14 DIAGNOSIS — Z23 Encounter for immunization: Secondary | ICD-10-CM

## 2018-09-14 MED ORDER — BICTEGRAVIR-EMTRICITAB-TENOFOV 50-200-25 MG PO TABS: 1.0000 | ORAL_TABLET | Freq: Every day | ORAL | 11 refills | Status: DC

## 2018-09-14 NOTE — Progress Notes (Signed)
Patient Active Problem List   Diagnosis Date Noted  . Human immunodeficiency virus (HIV) disease ([place]) 09/14/2006    Priority: High  . Bell's palsy 02/09/2018    Priority: Medium  . Elevated liver enzymes 11/29/2013    Priority: Medium  . Dyslipidemia 01/25/2013    Priority: Medium  . Hyperglycemia 01/25/2013    Priority: Medium  . Morbid obesity ([place]) 05/07/2010    Priority: Medium  . Rash 06/13/2014  . Multiple lipomas 01/25/2013  . LOW BACK PAIN, CHRONIC 11/28/2008  . ALOPECIA 08/03/2008  . 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 PAP SMEAR, ABNORMAL 12/01/2006    Patient's Medications  New Prescriptions   BICTEGRAVIR-EMTRICITABINE-TENOFOVIR AF (BIKTARVY) 50-200-25 MG TABS TABLET    Take 1 tablet by mouth daily.  Previous Medications   CALCIUM CARBONATE (TUMS - DOSED IN MG ELEMENTAL CALCIUM) 500 MG CHEWABLE TABLET    Chew 1 tablet by mouth daily.  Modified Medications   No medications on file  Discontinued Medications   GENVOYA 150-150-200-10 MG TABS TABLET    TAKE 1 TABLET BY MOUTH DAILY WITH BREAKFAST    Subjective: Kathleen Savage is in for her routine HIV follow-up visit.  She recently recertified for ADAP.  She was given a bonus for having worked at [employer] for 15 years which made her most recent paycheck much higher than normal.  However when she brought in the next paycheck showing her base today she was approved right away.  She denies missing any doses of Genvoya.  She says that she generally takes it at bedtime, sometimes without any food on her stomach.  The right sided facial weakness continues to improve slowly.  She is feeling well.  Review of Systems: Review of Systems  Constitutional: Negative for chills, diaphoresis, fever, malaise/fatigue and weight loss.  HENT: Negative for sore throat.   Respiratory: Negative for cough, sputum production and shortness of breath.   Cardiovascular: Negative for chest pain.  Gastrointestinal: Negative for abdominal pain,  diarrhea, heartburn, nausea and vomiting.  Genitourinary: Negative for dysuria and frequency.  Musculoskeletal: Negative for joint pain and myalgias.  Skin: Negative for rash.  Neurological: Negative for dizziness and headaches.  Psychiatric/Behavioral: Negative for depression and substance abuse. The patient is not nervous/anxious.     Past Medical History:  Diagnosis Date  . Glucose intolerance (pre-diabetes)   . HIV infection ([place])   . Hyperlipidemia     Social History   Tobacco Use  . Smoking status: Never Smoker  . Smokeless tobacco: Never Used  Substance Use Topics  . Alcohol use: Yes    Alcohol/week: 3.0 standard drinks    Types: 1 Shots of liquor, 1 Cans of beer, 1 Glasses of wine per week    Comment: occaosional  . Drug use: No    Family History  Problem Relation Age of Onset  . Cancer Maternal Aunt        Breast Cancer  . Cancer Cousin        Breast cancer - maternal cousin  . Cancer Cousin        breast cancer  . Cancer Cousin        breast cancer  . Cancer Maternal Aunt        stomach cancer  . Stroke Father   . Cancer Sister     No Known Allergies  Health Maintenance  Topic Date Due  . TETANUS/TDAP  07/20/1996  . PAP SMEAR  02/09/2016  . INFLUENZA  VACCINE  06/10/2018  . HIV Screening  Completed    Objective:  Vitals:   09/14/18 0854  BP: 132/81  Pulse: 80  Temp: 97.9 F (36.6 C)  Weight: 252 lb (114.3 kg)  Height: 5\' 2"  (1.575 m)   Body mass index is 46.09 kg/m.  Physical Exam  Constitutional: She is oriented to person, place, and time.  She is in her usual good spirits.  HENT:  Mouth/Throat: No oropharyngeal exudate.  Her right facial palsy is just barely noticeable.  It continues to improve.  Eyes: Conjunctivae are normal.  Cardiovascular: Normal rate and regular rhythm.  No murmur heard. Pulmonary/Chest: Breath sounds normal.  Abdominal: Soft. She exhibits no mass. There is no tenderness.  Musculoskeletal: Normal range of  motion.  Neurological: She is alert and oriented to person, place, and time.  Skin: No rash noted.  Psychiatric: She has a normal mood and affect.    Lab Results Lab Results  Component Value Date   WBC 13.1 (H) 01/26/2018   HGB 13.5 01/26/2018   HCT 39.7 01/26/2018   MCV 87.3 01/26/2018   PLT 387 01/26/2018    Lab Results  Component Value Date   CREATININE 0.62 01/26/2018   BUN 14 01/26/2018   NA 136 01/26/2018   K 4.4 01/26/2018   CL 103 01/26/2018   CO2 25 01/26/2018    Lab Results  Component Value Date   ALT 19 01/26/2018   AST 10 01/26/2018   ALKPHOS 46 01/06/2017   BILITOT 0.3 01/26/2018    Lab Results  Component Value Date   CHOL 241 (H) 01/26/2018   HDL 63 01/26/2018   LDLCALC 146 (H) 01/26/2018   TRIG 185 (H) 01/26/2018   CHOLHDL 3.8 01/26/2018   Lab Results  Component Value Date   LABRPR NON-REACTIVE 01/26/2018   HIV 1 RNA Quant (copies/mL)  Date Value  08/31/2018 <20 NOT DETECTED  01/26/2018 <20 DETECTED (A)  07/14/2017 <20 NOT DETECTED   CD4 T Cell Abs (/uL)  Date Value  08/31/2018 740  01/26/2018 680  07/14/2017 1,000     Problem List Items Addressed This Visit      High   Human immunodeficiency virus (HIV) disease ([place])    Her infection remains under excellent, long-term control.  I will change her to [facility] which can be taken with or without food.  She will follow-up after lab work in 6 months.      Relevant Medications   bictegravir-emtricitabine-tenofovir AF (BIKTARVY) 50-200-25 MG TABS tablet   Other Relevant Orders   T-helper cell (CD4)- ([facility] clinic only)   HIV-1 RNA quant-no reflex-bld   CBC   Comprehensive metabolic panel   Lipid panel   RPR     Medium   Bell's palsy    Her Bell's palsy continues to resolve slowly.           Michel Bickers, MD [facility] for [place] [facility] [phone] pager   [phone] cell 09/14/2018, 9:17 AM

## 2018-09-14 NOTE — Assessment & Plan Note (Signed)
Her infection remains under excellent, long-term control.  I will change her to [facility] which can be taken with or without food.  She will follow-up after lab work in 6 months.

## 2018-09-14 NOTE — Progress Notes (Signed)
Flu, Prevnar-13, and Menveo #2 administered today. Patient tolerated well.

## 2018-09-14 NOTE — Assessment & Plan Note (Signed)
Her Bell's palsy continues to resolve slowly.

## 2018-09-14 NOTE — Addendum Note (Signed)
Addended by: Eugenia Mcalpine on: 09/14/2018 09:27 AM   Modules accepted: Orders

## 2018-12-13 DIAGNOSIS — R05 Cough: Secondary | ICD-10-CM | POA: Insufficient documentation

## 2018-12-13 DIAGNOSIS — R51 Headache: Secondary | ICD-10-CM | POA: Insufficient documentation

## 2018-12-13 DIAGNOSIS — Z5321 Procedure and treatment not carried out due to patient leaving prior to being seen by health care provider: Secondary | ICD-10-CM | POA: Insufficient documentation

## 2018-12-14 ENCOUNTER — Emergency Department (HOSPITAL_COMMUNITY)
Admission: EM | Admit: 2018-12-14 | Discharge: 2018-12-14 | Disposition: A | Discharge status: Home / Self Care | Payer: Self-pay | Attending: Emergency Medicine | Admitting: Emergency Medicine

## 2018-12-14 ENCOUNTER — Other Ambulatory Visit: Payer: Self-pay

## 2018-12-14 ENCOUNTER — Encounter (HOSPITAL_COMMUNITY): Payer: Self-pay

## 2018-12-14 NOTE — ED Notes (Signed)
Called pt x2, no response. 

## 2018-12-14 NOTE — ED Triage Notes (Signed)
Pt he with a cough and headache for the last week.  States when taking motrin the headache goes away then comes back.  Non productive cough.  A&Ox4, no neuro deficits.

## 2018-12-14 NOTE — ED Notes (Signed)
Called pt name for the third time ; no answer

## 2018-12-23 ENCOUNTER — Ambulatory Visit: Payer: Self-pay

## 2018-12-27 ENCOUNTER — Ambulatory Visit: Payer: Self-pay

## 2018-12-28 ENCOUNTER — Encounter: Payer: Self-pay | Admitting: Internal Medicine

## 2019-03-15 ENCOUNTER — Other Ambulatory Visit: Payer: Self-pay

## 2019-03-15 DIAGNOSIS — B20 Human immunodeficiency virus [HIV] disease: Secondary | ICD-10-CM

## 2019-03-15 LAB — T-HELPER CELL (CD4) - (RCID CLINIC ONLY)
CD4 % Helper T Cell: 41 % (ref 33–65)
CD4 T Cell Abs: 876 /uL (ref 400–1790)

## 2019-03-19 LAB — LIPID PANEL
Cholesterol: 219 mg/dL — ABNORMAL HIGH (ref <200)
HDL: 53 mg/dL (ref >=50)
LDL Cholesterol (Calc): 139 mg/dL (calc) — ABNORMAL HIGH
Non-HDL Cholesterol (Calc): 166 mg/dL (calc) — ABNORMAL HIGH (ref <130)
Total CHOL/HDL Ratio: 4.1 (calc) (ref <5.0)
Triglycerides: 148 mg/dL (ref <150)

## 2019-03-19 LAB — COMPREHENSIVE METABOLIC PANEL
AG Ratio: 1.3 (calc) (ref 1.0–2.5)
ALT: 23 U/L (ref 6–29)
AST: 18 U/L (ref 10–30)
Albumin: 3.9 g/dL (ref 3.6–5.1)
Alkaline phosphatase (APISO): 47 U/L (ref 31–125)
BUN: 12 mg/dL (ref 7–25)
CO2: 21 mmol/L (ref 20–32)
Calcium: 8.8 mg/dL (ref 8.6–10.2)
Chloride: 105 mmol/L (ref 98–110)
Creat: 0.69 mg/dL (ref 0.50–1.10)
Globulin: 3 g/dL (calc) (ref 1.9–3.7)
Glucose, Bld: 112 mg/dL — ABNORMAL HIGH (ref 65–99)
Potassium: 4.3 mmol/L (ref 3.5–5.3)
Sodium: 135 mmol/L (ref 135–146)
Total Bilirubin: 0.4 mg/dL (ref 0.2–1.2)
Total Protein: 6.9 g/dL (ref 6.1–8.1)

## 2019-03-19 LAB — CBC
HCT: 37.5 % (ref 35.0–45.0)
Hemoglobin: 13.1 g/dL (ref 11.7–15.5)
MCH: 31 pg (ref 27.0–33.0)
MCHC: 34.9 g/dL (ref 32.0–36.0)
MCV: 88.7 fL (ref 80.0–100.0)
MPV: 10.2 fL (ref 7.5–12.5)
Platelets: 369 10*3/uL (ref 140–400)
RBC: 4.23 10*6/uL (ref 3.80–5.10)
RDW: 13.6 % (ref 11.0–15.0)
WBC: 6.6 10*3/uL (ref 3.8–10.8)

## 2019-03-19 LAB — HIV-1 RNA QUANT-NO REFLEX-BLD
HIV 1 RNA Quant: <20 copies/mL
HIV-1 RNA Quant, Log: <1.3 Log copies/mL

## 2019-03-19 LAB — RPR: RPR Ser Ql: NONREACTIVE

## 2019-03-29 ENCOUNTER — Encounter: Payer: Self-pay | Admitting: Internal Medicine

## 2019-06-02 ENCOUNTER — Encounter: Payer: Self-pay | Admitting: Internal Medicine

## 2019-06-08 ENCOUNTER — Ambulatory Visit (INDEPENDENT_AMBULATORY_CARE_PROVIDER_SITE_OTHER): Payer: Self-pay | Admitting: Internal Medicine

## 2019-06-08 ENCOUNTER — Other Ambulatory Visit: Payer: Self-pay

## 2019-06-08 ENCOUNTER — Encounter: Payer: Self-pay | Admitting: Internal Medicine

## 2019-06-08 ENCOUNTER — Ambulatory Visit: Payer: Self-pay

## 2019-06-08 DIAGNOSIS — B20 Human immunodeficiency virus [HIV] disease: Secondary | ICD-10-CM

## 2019-06-08 NOTE — Assessment & Plan Note (Signed)
Her infection remains under excellent, long-term control.  She will continue annual Biktarvy and follow-up after lab work in 1 year.

## 2019-06-08 NOTE — Progress Notes (Signed)
Patient Active Problem List   Diagnosis Date Noted  . Human immunodeficiency virus (HIV) disease ([place]) 09/14/2006    Priority: High  . Bell's palsy 02/09/2018    Priority: Medium  . Elevated liver enzymes 11/29/2013    Priority: Medium  . Dyslipidemia 01/25/2013    Priority: Medium  . Hyperglycemia 01/25/2013    Priority: Medium  . Morbid obesity ([place]) 05/07/2010    Priority: Medium  . Rash 06/13/2014  . Multiple lipomas 01/25/2013  . LOW BACK PAIN, CHRONIC 11/28/2008  . ALOPECIA 08/03/2008  . 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 PAP SMEAR, ABNORMAL 12/01/2006    Patient's Medications  New Prescriptions   No medications on file  Previous Medications   BICTEGRAVIR-EMTRICITABINE-TENOFOVIR AF (BIKTARVY) 50-200-25 MG TABS TABLET    Take 1 tablet by mouth daily.   CALCIUM CARBONATE (TUMS - DOSED IN MG ELEMENTAL CALCIUM) 500 MG CHEWABLE TABLET    Chew 1 tablet by mouth daily.  Modified Medications   No medications on file  Discontinued Medications   No medications on file    Subjective: Kathleen Savage is in for her routine HIV follow-up visit.  She has had no problems obtaining, taking or tolerating her Biktarvy.  She recalls missing only 1 dose in the past few months.  She normally takes it around 11 PM when she gets home from work.  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  She says that she does feel safe at work.  They are only serving at the [place].  She feels like her right sided facial weakness it is still slowly improving 1 year after developing Bell's palsy.  Review of Systems: Review of Systems  Constitutional: Negative for fever.  Respiratory: Negative for cough and shortness of breath.   Cardiovascular: Negative for chest pain.  Gastrointestinal: Negative for abdominal pain, diarrhea, nausea and vomiting.  Neurological: Positive for focal weakness. Negative for headaches.  Psychiatric/Behavioral: Negative for depression.     Past Medical History:  Diagnosis Date  . Glucose intolerance (pre-diabetes)   . HIV infection ([place])   . Hyperlipidemia     Social History   Tobacco Use  . Smoking status: Never Smoker  . Smokeless tobacco: Never Used  Substance Use Topics  . Alcohol use: Yes    Alcohol/week: 3.0 standard drinks    Types: 1 Shots of liquor, 1 Cans of beer, 1 Glasses of wine per week    Comment: occaosional  . Drug use: No    Family History  Problem Relation Age of Onset  . Cancer Maternal Aunt        Breast Cancer  . Cancer Cousin        Breast cancer - maternal cousin  . Cancer Cousin        breast cancer  . Cancer Cousin        breast cancer  . Cancer Maternal Aunt        stomach cancer  . Stroke Father   . Cancer Sister     No Known Allergies  Health Maintenance  Topic Date Due  . TETANUS/TDAP  07/20/1996  . PAP SMEAR-Modifier  02/09/2016  . INFLUENZA VACCINE  06/11/2019  . HIV Screening  Completed    Objective:  Vitals:   06/08/19 1433  BP: 112/71  Pulse: 89  Temp: 98 F (36.7 C)  Height: 5\' 2"  (1.575 m)   Body mass index is 46.09 kg/m.  Physical  Exam Constitutional:      Comments: She is in good spirits today.  Cardiovascular:     Rate and Rhythm: Normal rate and regular rhythm.     Heart sounds: No murmur.  Pulmonary:     Effort: Pulmonary effort is normal.     Breath sounds: Normal breath sounds.  Neurological:     Comments: Her right sided facial weakness remains but has improved since her last visit.  Psychiatric:        Mood and Affect: Mood normal.     Lab Results Lab Results  Component Value Date   WBC 6.6 03/15/2019   HGB 13.1 03/15/2019   HCT 37.5 03/15/2019   MCV 88.7 03/15/2019   PLT 369 03/15/2019    Lab Results  Component Value Date   CREATININE 0.69 03/15/2019   BUN 12 03/15/2019   NA 135 03/15/2019   K 4.3 03/15/2019   CL 105 03/15/2019   CO2 21 03/15/2019    Lab Results  Component Value Date   ALT 23 03/15/2019   AST  18 03/15/2019   ALKPHOS 46 01/06/2017   BILITOT 0.4 03/15/2019    Lab Results  Component Value Date   CHOL 219 (H) 03/15/2019   HDL 53 03/15/2019   LDLCALC 139 (H) 03/15/2019   TRIG 148 03/15/2019   CHOLHDL 4.1 03/15/2019   Lab Results  Component Value Date   LABRPR NON-REACTIVE 03/15/2019   HIV 1 RNA Quant (copies/mL)  Date Value  03/15/2019 <20 NOT DETECTED  08/31/2018 <20 NOT DETECTED  01/26/2018 <20 DETECTED (A)   CD4 T Cell Abs (/uL)  Date Value  03/15/2019 876  08/31/2018 740  01/26/2018 680     Problem List Items Addressed This Visit      High   Human immunodeficiency virus (HIV) disease ([place])    Her infection remains under excellent, long-term control.  She will continue annual Biktarvy and follow-up after lab work in 1 year.      Relevant Orders   CBC   T-helper cell (CD4)- ([facility] clinic only)   Comprehensive metabolic panel   Lipid panel   RPR   HIV-1 RNA quant-no reflex-bld        Michel Bickers, MD [facility] for [place] [phone] pager   [phone] cell 06/08/2019, 2:56 PM

## 2019-09-27 ENCOUNTER — Other Ambulatory Visit: Payer: Self-pay | Admitting: Internal Medicine

## 2019-09-27 DIAGNOSIS — B20 Human immunodeficiency virus [HIV] disease: Secondary | ICD-10-CM

## 2020-02-28 ENCOUNTER — Ambulatory Visit: Payer: Self-pay

## 2020-02-28 ENCOUNTER — Other Ambulatory Visit: Payer: Self-pay

## 2020-02-28 DIAGNOSIS — B20 Human immunodeficiency virus [HIV] disease: Secondary | ICD-10-CM

## 2020-02-28 MED ORDER — BIKTARVY 50-200-25 MG PO TABS: 1.0000 | ORAL_TABLET | Freq: Every day | ORAL | 5 refills | Status: DC

## 2020-02-29 ENCOUNTER — Encounter: Payer: Self-pay | Admitting: Internal Medicine

## 2020-05-22 ENCOUNTER — Other Ambulatory Visit: Payer: Self-pay

## 2020-05-22 DIAGNOSIS — B20 Human immunodeficiency virus [HIV] disease: Secondary | ICD-10-CM

## 2020-05-23 LAB — T-HELPER CELL (CD4) - (RCID CLINIC ONLY)
CD4 % Helper T Cell: 43 % (ref 33–65)
CD4 T Cell Abs: 966 /uL (ref 400–1790)

## 2020-05-25 LAB — CBC
HCT: 37.2 % (ref 35.0–45.0)
Hemoglobin: 12.3 g/dL (ref 11.7–15.5)
MCH: 30.1 pg (ref 27.0–33.0)
MCHC: 33.1 g/dL (ref 32.0–36.0)
MCV: 91.2 fL (ref 80.0–100.0)
MPV: 10.1 fL (ref 7.5–12.5)
Platelets: 311 10*3/uL (ref 140–400)
RBC: 4.08 10*6/uL (ref 3.80–5.10)
RDW: 12.8 % (ref 11.0–15.0)
WBC: 6.4 10*3/uL (ref 3.8–10.8)

## 2020-05-25 LAB — COMPREHENSIVE METABOLIC PANEL
AG Ratio: 1.2 (calc) (ref 1.0–2.5)
ALT: 27 U/L (ref 6–29)
AST: 18 U/L (ref 10–30)
Albumin: 3.8 g/dL (ref 3.6–5.1)
Alkaline phosphatase (APISO): 49 U/L (ref 31–125)
BUN: 14 mg/dL (ref 7–25)
CO2: 24 mmol/L (ref 20–32)
Calcium: 8.3 mg/dL — ABNORMAL LOW (ref 8.6–10.2)
Chloride: 105 mmol/L (ref 98–110)
Creat: 0.64 mg/dL (ref 0.50–1.10)
Globulin: 3.1 g/dL (calc) (ref 1.9–3.7)
Glucose, Bld: 106 mg/dL — ABNORMAL HIGH (ref 65–99)
Potassium: 4.6 mmol/L (ref 3.5–5.3)
Sodium: 137 mmol/L (ref 135–146)
Total Bilirubin: 0.4 mg/dL (ref 0.2–1.2)
Total Protein: 6.9 g/dL (ref 6.1–8.1)

## 2020-05-25 LAB — HIV-1 RNA QUANT-NO REFLEX-BLD
HIV 1 RNA Quant: <20 copies/mL — AB
HIV-1 RNA Quant, Log: <1.3 Log copies/mL — AB

## 2020-05-25 LAB — RPR: RPR Ser Ql: NONREACTIVE

## 2020-05-25 LAB — LIPID PANEL
Cholesterol: 190 mg/dL (ref <200)
HDL: 51 mg/dL (ref >=50)
LDL Cholesterol (Calc): 117 mg/dL (calc) — ABNORMAL HIGH
Non-HDL Cholesterol (Calc): 139 mg/dL (calc) — ABNORMAL HIGH (ref <130)
Total CHOL/HDL Ratio: 3.7 (calc) (ref <5.0)
Triglycerides: 113 mg/dL (ref <150)

## 2020-06-05 ENCOUNTER — Encounter: Payer: Self-pay | Admitting: Internal Medicine

## 2020-06-11 ENCOUNTER — Ambulatory Visit: Payer: Self-pay

## 2020-06-11 ENCOUNTER — Encounter: Payer: Self-pay | Admitting: Internal Medicine

## 2020-06-11 ENCOUNTER — Other Ambulatory Visit: Payer: Self-pay

## 2020-06-11 ENCOUNTER — Ambulatory Visit (INDEPENDENT_AMBULATORY_CARE_PROVIDER_SITE_OTHER): Payer: Self-pay | Admitting: Internal Medicine

## 2020-06-11 DIAGNOSIS — B20 Human immunodeficiency virus [HIV] disease: Secondary | ICD-10-CM

## 2020-06-11 MED ORDER — BIKTARVY 50-200-25 MG PO TABS: 1.0000 | ORAL_TABLET | Freq: Every day | ORAL | 11 refills | Status: DC

## 2020-06-11 NOTE — Progress Notes (Signed)
Patient Active Problem List   Diagnosis Date Noted  . Human immunodeficiency virus (HIV) disease ([place]) 09/14/2006    Priority: High  . Bell's palsy 02/09/2018    Priority: Medium  . Elevated liver enzymes 11/29/2013    Priority: Medium  . Dyslipidemia 01/25/2013    Priority: Medium  . Hyperglycemia 01/25/2013    Priority: Medium  . Morbid obesity ([place]) 05/07/2010    Priority: Medium  . Rash 06/13/2014  . Multiple lipomas 01/25/2013  . LOW BACK PAIN, CHRONIC 11/28/2008  . ALOPECIA 08/03/2008  . 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 PAP SMEAR, ABNORMAL 12/01/2006    Patient's Medications  New Prescriptions   No medications on file  Previous Medications   CALCIUM CARBONATE (TUMS - DOSED IN MG ELEMENTAL CALCIUM) 500 MG CHEWABLE TABLET    Chew 1 tablet by mouth daily.  Modified Medications   Modified Medication Previous Medication   BICTEGRAVIR-EMTRICITABINE-TENOFOVIR AF (BIKTARVY) 50-200-25 MG TABS TABLET bictegravir-emtricitabine-tenofovir AF (BIKTARVY) 50-200-25 MG TABS tablet      Take 1 tablet by mouth daily.    Take 1 tablet by mouth daily.  Discontinued Medications   No medications on file    Subjective: Kathleen Savage is in for her routine HIV follow-up visit.  She has not had any problems obtaining, taking or tolerating her Biktarvy and does not think she has missed any doses.  She has continued to work full-time at [employer].  She has not been vaccinated against Covid yet.  She says that she is worried about blood clots and says that many of her family members in [place] developed symptomatic Covid shortly after being vaccinated.  Review of Systems: Review of Systems  Constitutional: Negative for fever and weight loss.  Respiratory: Negative for cough and shortness of breath.   Cardiovascular: Negative for chest pain.  Psychiatric/Behavioral: Negative for depression. The patient is not nervous/anxious.     Past Medical History:  Diagnosis Date  . Glucose  intolerance (pre-diabetes)   . HIV infection ([place])   . Hyperlipidemia     Social History   Tobacco Use  . Smoking status: Never Smoker  . Smokeless tobacco: Never Used  Vaping Use  . Vaping Use: Never used  Substance Use Topics  . Alcohol use: Yes    Alcohol/week: 3.0 standard drinks    Types: 1 Shots of liquor, 1 Cans of beer, 1 Glasses of wine per week    Comment: occaosional  . Drug use: No    Family History  Problem Relation Age of Onset  . Cancer Maternal Aunt        Breast Cancer  . Cancer Cousin        Breast cancer - maternal cousin  . Cancer Cousin        breast cancer  . Cancer Cousin        breast cancer  . Cancer Maternal Aunt        stomach cancer  . Stroke Father   . Cancer Sister     No Known Allergies  Health Maintenance  Topic Date Due  . COVID-19 Vaccine (1) Never done  . TETANUS/TDAP  Never done  . PAP SMEAR-Modifier  02/09/2016  . INFLUENZA VACCINE  06/10/2020  . Hepatitis C Screening  Completed  . HIV Screening  Completed    Objective:  Vitals:   06/11/20 0930  BP: 115/78  Pulse: 78  Temp: 98.6 F (37 C)  TempSrc: Oral  SpO2: 97%  Weight: (!) 255 lb (115.7 kg)  Height: 5\' 1"  (1.549 m)   Body mass index is 48.18 kg/m.  Physical Exam Constitutional:      Comments: She is in good spirits as usual.  Cardiovascular:     Rate and Rhythm: Normal rate and regular rhythm.     Heart sounds: No murmur heard.   Pulmonary:     Effort: Pulmonary effort is normal.     Breath sounds: Normal breath sounds.  Psychiatric:        Mood and Affect: Mood normal.     Lab Results Lab Results  Component Value Date   WBC 6.4 05/22/2020   HGB 12.3 05/22/2020   HCT 37.2 05/22/2020   MCV 91.2 05/22/2020   PLT 311 05/22/2020    Lab Results  Component Value Date   CREATININE 0.64 05/22/2020   BUN 14 05/22/2020   NA 137 05/22/2020   K 4.6 05/22/2020   CL 105 05/22/2020   CO2 24 05/22/2020    Lab Results  Component Value Date   ALT  27 05/22/2020   AST 18 05/22/2020   ALKPHOS 46 01/06/2017   BILITOT 0.4 05/22/2020    Lab Results  Component Value Date   CHOL 190 05/22/2020   HDL 51 05/22/2020   LDLCALC 117 (H) 05/22/2020   TRIG 113 05/22/2020   CHOLHDL 3.7 05/22/2020   Lab Results  Component Value Date   LABRPR NON-REACTIVE 05/22/2020   HIV 1 RNA Quant (copies/mL)  Date Value  05/22/2020 <20 DETECTED (A)  03/15/2019 <20 NOT DETECTED  08/31/2018 <20 NOT DETECTED   CD4 T Cell Abs (/uL)  Date Value  05/22/2020 966  03/15/2019 876  08/31/2018 740     Problem List Items Addressed This Visit      High   Human immunodeficiency virus (HIV) disease ([place])    Her infection remains under excellent, long-term control.  I encouraged her to take the [place] over [place] as soon as possible.  She will continue Biktarvy and follow-up here after lab work in 1 year.      Relevant Medications   bictegravir-emtricitabine-tenofovir AF (BIKTARVY) 50-200-25 MG TABS tablet   Other Relevant Orders   CBC   T-helper cell (CD4)- ([facility] clinic only)   Comprehensive metabolic panel   Lipid panel   RPR   HIV-1 RNA quant-no reflex-bld        Michel Bickers, MD [facility] for Infectious [place] [phone] pager   [phone] cell 06/11/2020, 11:49 AM

## 2020-06-11 NOTE — Assessment & Plan Note (Signed)
Her infection remains under excellent, long-term control.  I encouraged her to take the [place] over [place] as soon as possible.  She will continue Biktarvy and follow-up here after lab work in 1 year.

## 2020-07-19 ENCOUNTER — Encounter (HOSPITAL_COMMUNITY): Payer: Self-pay

## 2020-07-19 ENCOUNTER — Emergency Department (HOSPITAL_COMMUNITY): Payer: Self-pay

## 2020-07-19 ENCOUNTER — Emergency Department (HOSPITAL_COMMUNITY)
Admission: EM | Admit: 2020-07-19 | Discharge: 2020-07-20 | Disposition: A | Discharge status: Home / Self Care | Payer: Self-pay | Attending: Emergency Medicine | Admitting: Emergency Medicine

## 2020-07-19 DIAGNOSIS — M25562 Pain in left knee: Secondary | ICD-10-CM | POA: Insufficient documentation

## 2020-07-19 DIAGNOSIS — L089 Local infection of the skin and subcutaneous tissue, unspecified: Secondary | ICD-10-CM | POA: Insufficient documentation

## 2020-07-19 LAB — CBC
HCT: 38.1 % (ref 36.0–46.0)
Hemoglobin: 12.3 g/dL (ref 12.0–15.0)
MCH: 29.4 pg (ref 26.0–34.0)
MCHC: 32.3 g/dL (ref 30.0–36.0)
MCV: 90.9 fL (ref 80.0–100.0)
Platelets: 379 10*3/uL (ref 150–400)
RBC: 4.19 MIL/uL (ref 3.87–5.11)
RDW: 13 % (ref 11.5–15.5)
WBC: 7.8 10*3/uL (ref 4.0–10.5)
nRBC: 0 % (ref 0.0–0.2)

## 2020-07-19 LAB — BASIC METABOLIC PANEL
Anion gap: 12 (ref 5–15)
BUN: 13 mg/dL (ref 6–20)
CO2: 21 mmol/L — ABNORMAL LOW (ref 22–32)
Calcium: 8.6 mg/dL — ABNORMAL LOW (ref 8.9–10.3)
Chloride: 105 mmol/L (ref 98–111)
Creatinine, Ser: 0.72 mg/dL (ref 0.44–1.00)
GFR calc Af Amer: >60 mL/min (ref >60)
GFR calc non Af Amer: >60 mL/min (ref >60)
Glucose, Bld: 101 mg/dL — ABNORMAL HIGH (ref 70–99)
Potassium: 3.8 mmol/L (ref 3.5–5.1)
Sodium: 138 mmol/L (ref 135–145)

## 2020-07-19 NOTE — ED Triage Notes (Addendum)
Pt arrives to ED w/ c/o 7/10 L knee pain x 3 weeks and wound on R breast. Pt denies fever, chills. Pt HIV +

## 2020-07-20 ENCOUNTER — Emergency Department (HOSPITAL_COMMUNITY): Payer: Self-pay

## 2020-07-20 ENCOUNTER — Encounter (HOSPITAL_COMMUNITY): Payer: Self-pay | Admitting: Emergency Medicine

## 2020-07-20 MED ORDER — DOXYCYCLINE HYCLATE 100 MG PO CAPS: 100.0000 mg | ORAL_CAPSULE | Freq: Two times a day (BID) | ORAL | 0 refills | Status: DC

## 2020-07-20 MED ORDER — DICLOFENAC SODIUM 1 % EX GEL: 4.0000 g | Freq: Four times a day (QID) | CUTANEOUS | 1 refills | Status: DC

## 2020-07-20 NOTE — ED Notes (Signed)
Soft knee brace applied to pt left knee, instructed pt how to put on/take off and adjust, pt understands

## 2020-07-20 NOTE — ED Notes (Signed)
Pt advised to follow-up w sports medicine for knee and [facility] for infection on rt breast- rx sent to pharmacy

## 2020-07-20 NOTE — ED Provider Notes (Signed)
[facility] EMERGENCY DEPARTMENT Provider Note   CSN: [id number] Arrival date & time: 07/19/20  1336     History Chief Complaint  Patient presents with   Knee Pain   Wound Check    Kathleen Savage is a 44 y.o. female.  44 y/o female with hx of HIV (CD4 >900), HLD presents to the emergency department for chief complaints.  Her initial complaint is of left knee pain which has been present x3 weeks.  It only mildly bothers her with ambulation, but is more painful when she is bending down on her knees.  Rates her pain at 7/10 at its worst.  It is sharp, nonradiating and present to the lateral left knee.  She has taken Tylenol for symptoms with mild to moderate improvement.  Has not followed up with a specialist and denies any direct trauma or injury to the area.  She is on her feet a lot as she works at [employer].  Secondary complaint of wound to the right breast.  Symptoms began 2 weeks ago.  She initially thought that she was experiencing an ingrown hair, but the area has progressively gotten larger and more tender.  She denies any drainage from the wound.  Further denies fever, nipple discharge.  The history is provided by the patient. No language interpreter was used.  Knee Pain Wound Check       Past Medical History:  Diagnosis Date   Glucose intolerance (pre-diabetes)    HIV infection ([place])    Hyperlipidemia     Patient Active Problem List   Diagnosis Date Noted   Bell's palsy 02/09/2018   Rash 06/13/2014   Elevated liver enzymes 11/29/2013   Dyslipidemia 01/25/2013   Hyperglycemia 01/25/2013   Multiple lipomas 01/25/2013   Morbid obesity ([place]) 05/07/2010   LOW BACK PAIN, CHRONIC 11/28/2008   ALOPECIA 08/03/2008   HERPES ZOSTER 12/01/2006   PAP SMEAR, ABNORMAL 12/01/2006   Human immunodeficiency virus (HIV) disease ([place]) 09/14/2006    History reviewed. No pertinent surgical history.   OB History   No obstetric history on  file.     Family History  Problem Relation Age of Onset   Cancer Maternal Aunt        Breast Cancer   Cancer Cousin        Breast cancer - maternal cousin   Cancer Cousin        breast cancer   Cancer Cousin        breast cancer   Cancer Maternal Aunt        stomach cancer   Stroke Father    Cancer Sister     Social History   Tobacco Use   Smoking status: Never Smoker   Smokeless tobacco: Never Used  Vaping Use   Vaping Use: Never used  Substance Use Topics   Alcohol use: Yes    Alcohol/week: 3.0 standard drinks    Types: 1 Shots of liquor, 1 Cans of beer, 1 Glasses of wine per week    Comment: occaosional   Drug use: No    Home Medications Prior to Admission medications   Medication Sig Start Date End Date Taking? Authorizing Provider  bictegravir-emtricitabine-tenofovir AF (BIKTARVY) 50-200-25 MG TABS tablet Take 1 tablet by mouth daily. 06/11/20   Michel Bickers, MD  calcium carbonate (TUMS - DOSED IN MG ELEMENTAL CALCIUM) 500 MG chewable tablet Chew 1 tablet by mouth daily.    [provider]    Allergies  Patient has no known allergies.  Review of Systems   Review of Systems  Ten systems reviewed and are negative for acute change, except as noted in the HPI.    Physical Exam Updated Vital Signs BP 121/76 (BP Location: Right Arm)    Pulse 64    Temp 98.7 F (37.1 C) (Oral)    Resp 18    SpO2 100%   Physical Exam Vitals and nursing note reviewed.  Constitutional:      General: She is not in acute distress.    Appearance: She is well-developed. She is not diaphoretic.     Comments: Nontoxic appearing and in NAD  HENT:     Head: Normocephalic and atraumatic.  Eyes:     General: No scleral icterus.    Conjunctiva/sclera: Conjunctivae normal.  Cardiovascular:     Rate and Rhythm: Normal rate and regular rhythm.     Pulses: Normal pulses.     Comments: DP pulse 2+ in the LLE Pulmonary:     Effort: Pulmonary effort is normal. No  respiratory distress.     Comments: Respirations even and unlabored Musculoskeletal:        General: Normal range of motion.     Cervical back: Normal range of motion.     Comments: Normal AROM and PROM of the L knee. TTP along the lateral joint line of the L knee without crepitus or deformity. No posterior knee TTP. No effusion, heat to touch, lymphangitic streaking.  Skin:    General: Skin is warm and dry.     Coloration: Skin is not pale.     Comments: Planar macule to the right breast that is NOT indurated or draining. There is associated TTP. Skin peeling without sloughing. No nipple changes. See image below.  Neurological:     Mental Status: She is alert and oriented to person, place, and time.  Psychiatric:        Behavior: Behavior normal.        ED Results / Procedures / Treatments   Labs (all labs ordered are listed, but only abnormal results are displayed) Labs Reviewed  BASIC METABOLIC PANEL - Abnormal; Notable for the following components:      Result Value   CO2 21 (*)    Glucose, Bld 101 (*)    Calcium 8.6 (*)    All other components within normal limits  CBC    EKG None  Radiology DG Knee Complete 4 Views Left  Result Date: 07/19/2020 CLINICAL DATA:  Left knee pain EXAM: LEFT KNEE - COMPLETE 4+ VIEW COMPARISON:  None. FINDINGS: The joint spaces are maintained. No acute fracture or osteochondral abnormality. No significant degenerative changes. No obvious joint effusion. IMPRESSION: No acute bony findings or significant degenerative changes. Electronically Signed   By: Marijo Sanes M.D.   On: 07/19/2020 14:44    Procedures Procedures (including critical care time)  Medications Ordered in ED Medications - No data to display  ED Course  I have reviewed the triage vital signs and the nursing notes.  Pertinent labs & imaging results that were available during my care of the patient were reviewed by me and considered in my medical decision making (see  chart for details).    MDM Rules/Calculators/A&P                          Patient presents to the emergency department for evaluation of left knee pain. Patient neurovascularly intact on exam.  Imaging negative for fracture, dislocation, bony deformity. No swelling, erythema, heat to touch to the affected area; no concern for septic joint. Compartments in the affected extremity are soft. Plan for supportive management including RICE and NSAIDs; primary care follow up as needed.   Patient also with evidence of developing infection to the right breast.  There is no evidence of free air on chest x-ray to suggest necrotizing process.  This is also unlikely given symptom chronicity.  Will require referral for breast ultrasound.  Given information on the [facility] as she does not have a PCP.  Started on course of doxycycline.  Return precautions discussed and provided. Patient discharged in stable condition with no unaddressed concerns.   Final Clinical Impression(s) / ED Diagnoses Final diagnoses:  Acute pain of left knee  Skin infection    Rx / DC Orders ED Discharge Orders         Ordered    doxycycline (VIBRAMYCIN) 100 MG capsule  2 times daily        07/20/20 0402    diclofenac Sodium (VOLTAREN) 1 % GEL  4 times daily        07/20/20 0402           Antonietta Breach, PA-C 08/06/20 4068    Palumbo, April, MD 08/17/20 2302

## 2020-07-20 NOTE — Discharge Instructions (Signed)
Use Voltaren gel as prescribed for management of your knee pain.  You would benefit from follow-up with sports medicine.  Your x-ray in the ED was reassuring.  We recommend follow-up with the [facility] for further monitoring of your skin infection to your right breast.  You would also benefit from further imaging of this area at the breast center.  Take doxycycline as prescribed until finished.

## 2020-07-23 ENCOUNTER — Encounter: Payer: Self-pay | Admitting: Internal Medicine

## 2020-08-01 ENCOUNTER — Other Ambulatory Visit: Payer: Self-pay

## 2020-08-01 ENCOUNTER — Ambulatory Visit (INDEPENDENT_AMBULATORY_CARE_PROVIDER_SITE_OTHER): Payer: Self-pay | Admitting: Family Medicine

## 2020-08-01 DIAGNOSIS — M222X9 Patellofemoral disorders, unspecified knee: Secondary | ICD-10-CM | POA: Insufficient documentation

## 2020-08-01 DIAGNOSIS — M25562 Pain in left knee: Secondary | ICD-10-CM

## 2020-08-01 NOTE — Assessment & Plan Note (Signed)
Given history, clinical presentation, exam today, and previous x-rays she most likely suffers from patellofemoral pain syndrome.  I will have her wearing the knee brace that she got from the ED as if it is the one she describes this should help with patellar tracking.  Also want her to continue to use Voltaren gel up to 4 times per day as needed.  Originally I had intended for her to attend formal physical therapy as the exercises she was given by the ED she says were very painful.  However, patient is without insurance thus to save her some expense we will attempt very specific exercises that I gave her today.  Follow-up in 4 weeks

## 2020-08-01 NOTE — Progress Notes (Addendum)
    SUBJECTIVE:   CHIEF COMPLAINT / HPI:   Left knee pain Patient is a very pleasant 44 year old female who presents as a new patient to this clinic after having developed left knee pain approximately 3-4 weeks ago.  She states no known trauma or fall that caused the injury she was bending over at home trying to clean a cooler and did not fall but noticed that she bent over she had sharp knee pain in her left knee on the outside and subsequently has had knee pain ever since.  She states it is constant but the intensity fluctuates over the course of the day.  She states it is worse with activity particularly going up and down stairs does get somewhat better with rest. She also has aggravation of the pain with long periods of standing.  She has been seen in the ED for this and was given a knee brace and voltaren gel which she states helps a little bit.  She also had x-rays in the ED which I reviewed today and discussed with her and agree that those were largely normal.  PERTINENT  PMH / PSH: History of morbid obesity  OBJECTIVE:   BP 128/90   Ht 5\' 1"  (1.549 m)   Wt 250 lb (113.4 kg)   BMI 47.24 kg/m   No flowsheet data found. Knee, Left: Inspection was negative for erythema, ecchymosis, and effusion. No obvious bony abnormalities or signs of osteophyte development. Palpation yielded no asymmetric warmth; No joint line tenderness; No condyle tenderness; Positive for lateral side patellar tenderness; Mild patellar crepitus. Patellar and quadriceps tendons unremarkable, and no tenderness of the pes anserine bursa. No obvious Baker's cyst development. ROM normal in flexion (135 degrees) and extension (0 degrees). Normal hamstring and quadriceps strength. Neurovascularly intact bilaterally. Special Tests  - Cruciate Ligaments:   - Anterior Drawer:  NEG - Posterior Drawer: NEG   - Lachman:  NEG  - Collateral Ligaments:   - Varus/Valgus Stress test: NEG  - Meniscus:   - Thessaly: NEG   -  McMurray's: NEG  - Patella:   - Patellar grind/compression: NEG   - Patellar glide: Without apprehension   - Pain with resistance to patella rise with quad flexion   ASSESSMENT/PLAN:   Left knee pain Given history, clinical presentation, exam today, and previous x-rays she most likely suffers from patellofemoral pain syndrome.  I will have her wearing the knee brace that she got from the ED as if it is the one she describes this should help with patellar tracking.  Also want her to continue to use Voltaren gel up to 4 times per day as needed.  Originally I had intended for her to attend formal physical therapy as the exercises she was given by the ED she says were very painful.  However, patient is without insurance thus to save her some expense we will attempt very specific exercises that I gave her today.  Follow-up in 4 weeks     Nuala Alpha, DO PGY-4, Sports Medicine Fellow [place]  Addendum:  Patient seen in the office by fellow.  His history, exam, plan of care were precepted with me.  Karlton Lemon MD Kirt Boys

## 2020-08-01 NOTE — Patient Instructions (Addendum)
It was great to meet you today! Thank you for letting me participate in your care!  Today, we discussed your left knee pain which I believe is due to something called patellofemoral pain syndrome. Please continue using your knee brace while you are active or standing for long periods of time. Continue using Voltaren gel up to 4 times per day as needed for pain. I will get you home exercises and then see you back in 4 weeks.  Be well, Harolyn Rutherford, DO PGY-4, Sports Medicine Fellow [place]'

## 2020-08-02 ENCOUNTER — Encounter: Payer: Self-pay | Admitting: Family Medicine

## 2020-08-20 NOTE — Progress Notes (Signed)
Patient ID: Kathleen Savage, female    DOB: [birth year]  MRN: [id number]  CC: Hospital Follow-Up  Subjective: Kathleen Savage is a 44 y.o. female with history of Bell's palsy, alopecia, human immunodeficiency virus disease, dyslipidemia, hyperglycemia, and chronic low back pain who presents for hospital follow-up.  1. ER FOLLOW UP: 07/19/2020: Visit at the [facility] Emergency Department. During that encounter evaluation of left knee pain. Patient neurovascularly intact on exam. Imaging negative for fracture, dislocation, bony deformity. No swelling, erythema, heat to touch to the affected area; no concern for septic joint. Compartments in the affected extremity are soft. Plan for supportive management including RICE and NSAIDs; primary care follow-up as needed.   Patient also with evidence of developing infection to the right breast. There is no evidence of free air on chest x-ray to suggest necrotizing process. This is also unlikely given symptom chronicity. Will require referral for breast ultrasound. Started on course of Doxycycline. Return precautions discussed and provided. Patient discharged in stable condition with no unaddressed concerns.  08/01/2020: Visit at [facility]. During that encounter given history, clinical presentation, exam today, and previous x-rays she most likely suffers from patellofemoral pain syndrome. Patient to wear knee brace that she got from the ED as if it is the one she describes this should help with patellar tracking. Also, want her to continue to use Voltaren gel up to 4 times per day as needed. Originally wanted patient to attend formal physical therapy as the exercises she was given by the ED was very painful. However, patient is without insurance thus to save her some expense will attempt very specific exercises that were given today. Follow-up in 4 weeks.  08/21/2020: Time since discharge: 33 days Hospital/facility:  [facility] Emergency Department  Diagnosis: acute pain of left knee, skin infection  Procedures/tests: BMP, CBC, diagnostic knee left  Consultants: none New medications: Doxycycline, Diclofenac Sodium Discharge instructions: Return precautions discussed and provided. Patient discharged in stable condition with no unaddressed concerns. Status: better  Right breast is better, does have a purple/pink spot but much better and without pain and drainage. Did complete prescribed antibiotics from hospital discharge.  Left knee pain is stable. Reports she does have a follow-up appointment with [facility] at the end of this month.   Patient Active Problem List   Diagnosis Date Noted  . Left knee pain 08/01/2020  . Bell's palsy 02/09/2018  . Rash 06/13/2014  . Elevated liver enzymes 11/29/2013  . Dyslipidemia 01/25/2013  . Hyperglycemia 01/25/2013  . Multiple lipomas 01/25/2013  . Morbid obesity ([place]) 05/07/2010  . LOW BACK PAIN, CHRONIC 11/28/2008  . ALOPECIA 08/03/2008  . 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 PAP SMEAR, ABNORMAL 12/01/2006  . Human immunodeficiency virus (HIV) disease ([place]) 09/14/2006     Current Outpatient Medications on File Prior to Visit  Medication Sig Dispense Refill  . bictegravir-emtricitabine-tenofovir AF (BIKTARVY) 50-200-25 MG TABS tablet Take 1 tablet by mouth daily. 30 tablet 11  . calcium carbonate (TUMS - DOSED IN MG ELEMENTAL CALCIUM) 500 MG chewable tablet Chew 1 tablet by mouth daily.    . diclofenac Sodium (VOLTAREN) 1 % GEL Apply 4 g topically 4 (four) times daily. To left knee. 150 g 1  . doxycycline (VIBRAMYCIN) 100 MG capsule Take 1 capsule (100 mg total) by mouth 2 (two) times daily. 20 capsule 0   No current facility-administered medications on file prior to visit.    No Known Allergies  Social History   Socioeconomic History  . Marital status: Married    Spouse name: Not on file  . Number of children: Not on file    . Years of education: Not on file  . Highest education level: Not on file  Occupational History  . Not on file  Tobacco Use  . Smoking status: Never Smoker  . Smokeless tobacco: Never Used  Vaping Use  . Vaping Use: Never used  Substance and Sexual Activity  . Alcohol use: Yes    Alcohol/week: 3.0 standard drinks    Types: 1 Shots of liquor, 1 Cans of beer, 1 Glasses of wine per week    Comment: occaosional  . Drug use: No  . Sexual activity: Yes    Partners: Male    Birth control/protection: Condom    Comment: given condoms  Other Topics Concern  . Not on file  Social History Narrative  . Not on file   Social Determinants of Health   Financial Resource Strain:   . Difficulty of Paying Living Expenses: Not on file  Food Insecurity:   . Worried About Charity fundraiser in the Last Year: Not on file  . Ran Out of Food in the Last Year: Not on file  Transportation Needs:   . Lack of Transportation (Medical): Not on file  . Lack of Transportation (Non-Medical): Not on file  Physical Activity:   . Days of Exercise per Week: Not on file  . Minutes of Exercise per Session: Not on file  Stress:   . Feeling of Stress : Not on file  Social Connections:   . Frequency of Communication with Friends and Family: Not on file  . Frequency of Social Gatherings with Friends and Family: Not on file  . Attends Religious Services: Not on file  . Active Member of Clubs or Organizations: Not on file  . Attends Archivist Meetings: Not on file  . Marital Status: Not on file  Intimate Partner Violence:   . Fear of Current or Ex-Partner: Not on file  . Emotionally Abused: Not on file  . Physically Abused: Not on file  . Sexually Abused: Not on file    Family History  Problem Relation Age of Onset  . Cancer Maternal Aunt        Breast Cancer  . Cancer Cousin        Breast cancer - maternal cousin  . Cancer Cousin        breast cancer  . Cancer Cousin        breast  cancer  . Cancer Maternal Aunt        stomach cancer  . Stroke Father   . Cancer Sister     No past surgical history on file.  ROS: Review of Systems Negative except as stated above  PHYSICAL EXAM: BP 111/74 (BP Location: Left Arm, Patient Position: Sitting)   Pulse 80   Temp 98.2 F (36.8 C)   Ht 5\' 2"  (1.575 m)   Wt 260 lb 12.8 oz (118.3 kg)   SpO2 100%   BMI 47.70 kg/m   Physical Exam General appearance - alert, well appearing, and in no distress and oriented to person, place, and time Mental status - alert, oriented to person, place, and time, normal mood, behavior, speech, dress, motor activity, and thought processes Neck - supple, no significant adenopathy Lymphatics - no palpable lymphadenopathy, no hepatosplenomegaly Chest - clear to auscultation, no wheezes, rales or rhonchi, symmetric air  entry, no tachypnea, retractions or cyanosis Heart - normal rate, regular rhythm, normal S1, S2, no murmurs, rubs, clicks or gallops Breasts - patient declines to have breast exam Neurological - alert, oriented, normal speech, no focal findings or movement disorder noted, cranial nerves II through XII intact, motor and sensory grossly normal bilaterally, normal muscle tone, no tremors, strength 5/5, Romberg sign negative, normal gait and station  ASSESSMENT AND PLAN: 1. Hospital discharge follow-up: - Visit at the [facility] Emergency Department on 07/19/2020. During that encounter evaluation of left knee pain. Plan for supportive management including RICE and NSAIDs; primary care follow-up as needed. Recent appointment with [facility] on 08/01/2020. Plan to continue to use Voltaren gel up to 4 times per day as needed and will attempt very specific exercises that were given. Follow-up in 4 weeks. Today left knee pain is stable. Reports she does have a follow-up appointment with [facility] at the end of this month, counseled to keep all  appointments. - Patient also with evidence of developing infection to the right breast while at the [facility] Emergency Department on 07/19/2020. Will require referral for breast ultrasound. Started on course of Doxycycline.Today right breast is better, does have a purple/pink spot but much better and without pain and drainage. Did complete prescribed antibiotics from hospital discharge.  2. Mastitis: - Patient at [facility] Emergency Department on 07/19/2020 with evidence of developing infection to the right breast. There is no evidence of free air on chest x-ray to suggest necrotizing process. This was also unlikely given symptom chronicity. Will require referral for breast ultrasound. Started on course of  Doxycycline. - Referral for ultrasound of right breast for further evaluation. - US BREAST COMPLETE UNI RIGHT INC AXILLA; Future  3. Encounter for screening mammogram for malignant neoplasm of breast: - Mammogram referral for breast cancer screening. - Provided with Mammogram Scholarship. - MM Digital Screening; Future   Patient was given the opportunity to ask questions.  Patient verbalized understanding of the plan and was able to repeat key elements of the plan. Patient was given clear instructions to go to Emergency Department or return to medical center if symptoms don't improve, worsen, or new problems develop.The patient verbalized understanding.   Return in about 4 weeks or sooner if needed to establish care with any primary physician.  Camillia Herter, NP

## 2020-08-21 ENCOUNTER — Other Ambulatory Visit: Payer: Self-pay

## 2020-08-21 ENCOUNTER — Ambulatory Visit: Payer: Self-pay | Attending: Family | Admitting: Family

## 2020-08-21 VITALS — BP 111/74 | HR 80 | Temp 98.2°F | Ht 62.0 in | Wt 260.8 lb

## 2020-08-21 DIAGNOSIS — Z09 Encounter for follow-up examination after completed treatment for conditions other than malignant neoplasm: Secondary | ICD-10-CM

## 2020-08-21 DIAGNOSIS — L089 Local infection of the skin and subcutaneous tissue, unspecified: Secondary | ICD-10-CM

## 2020-08-21 DIAGNOSIS — Z1231 Encounter for screening mammogram for malignant neoplasm of breast: Secondary | ICD-10-CM

## 2020-08-21 DIAGNOSIS — N61 Mastitis without abscess: Secondary | ICD-10-CM

## 2020-08-21 DIAGNOSIS — Z789 Other specified health status: Secondary | ICD-10-CM

## 2020-08-21 NOTE — Progress Notes (Signed)
Pt states sometimes itchy in areola area no pain today

## 2020-08-21 NOTE — Patient Instructions (Signed)
Referral for mammogram and breast ultrasound. Follow-up in about 1 month to establish care with primary physician. Keep all appointments with [facility].  Mammogram A mammogram is an X-ray of the breasts that is done to check for changes that are not normal. This test can screen for and find any changes that may suggest breast cancer. Mammograms are regularly done on women. A man may have a mammogram if he has a lump or swelling in his breast. This test can also help to find other changes and variations in the breast. Tell a doctor:  About any allergies you have.  If you have breast implants.  If you have had previous breast disease, biopsy, or surgery.  If you are breastfeeding.  If you are younger than age 25.  If you have a family history of breast cancer.  Whether you are pregnant or may be pregnant. What are the risks? Generally, this is a safe procedure. However, problems may occur, including:  Exposure to radiation. Radiation levels are very low with this test.  The results being misinterpreted.  The need for further tests.  The inability of the mammogram to detect certain cancers. What happens before the procedure?  Have this test done about 1-2 weeks after your period. This is usually when your breasts are the least tender.  If you are visiting a new doctor or clinic, send any past mammogram images to your new doctor's office.  Wash your breasts and under your arms the day of the test.  Do not use deodorants, perfumes, lotions, or powders on the day of the test.  Take off any jewelry from your neck.  Wear clothes that you can change into and out of easily. What happens during the procedure?   You will undress from the waist up. You will put on a gown.  You will stand in front of the X-ray machine.  Each breast will be placed between two plastic or glass plates. The plates will press down on your breast for a few seconds. Try to stay as relaxed as  possible. This does not cause any harm to your breasts. Any discomfort you feel will be very brief.  X-rays will be taken from different angles of each breast. The procedure may vary among doctors and hospitals. What happens after the procedure?  The mammogram will be read by a specialist (radiologist).  You may need to do certain parts of the test again. This depends on the quality of the images.  Ask when your test results will be ready. Make sure you get your test results.  You may go back to your normal activities. Summary  A mammogram is a low energy X-ray of the breasts that is done to check for abnormal changes. A man may have this test if he has a lump or swelling in his breast.  Before the procedure, tell your doctor about any breast problems that you have had in the past.  Have this test done about 1-2 weeks after your period.  For the test, each breast will be placed between two plastic or glass plates. The plates will press down on your breast for a few seconds.  The mammogram will be read by a specialist (radiologist). Ask when your test results will be ready. Make sure you get your test results. This information is not intended to replace advice given to you by your health care provider. Make sure you discuss any questions you have with your health care provider. Document  Revised: 06/17/2018 Document Reviewed: 06/17/2018 Elsevier Patient Education  [place].

## 2020-08-29 ENCOUNTER — Ambulatory Visit: Payer: Self-pay | Admitting: Family Medicine

## 2020-08-30 ENCOUNTER — Other Ambulatory Visit: Payer: Self-pay | Admitting: Obstetrics and Gynecology

## 2020-08-30 DIAGNOSIS — Z1231 Encounter for screening mammogram for malignant neoplasm of breast: Secondary | ICD-10-CM

## 2020-09-21 ENCOUNTER — Ambulatory Visit: Payer: Self-pay | Attending: Family Medicine | Admitting: Family Medicine

## 2020-09-25 ENCOUNTER — Other Ambulatory Visit: Payer: Self-pay

## 2020-09-25 ENCOUNTER — Ambulatory Visit
Admission: RE | Admit: 2020-09-25 | Discharge: 2020-09-25 | Disposition: A | Discharge status: Home / Self Care | Payer: No Typology Code available for payment source | Source: Ambulatory Visit | Attending: Obstetrics and Gynecology | Admitting: Obstetrics and Gynecology

## 2020-09-25 ENCOUNTER — Other Ambulatory Visit: Payer: Self-pay | Admitting: Obstetrics and Gynecology

## 2020-09-25 ENCOUNTER — Ambulatory Visit: Payer: Self-pay | Admitting: *Deleted

## 2020-09-25 ENCOUNTER — Ambulatory Visit
Admission: RE | Admit: 2020-09-25 | Discharge: 2020-09-25 | Disposition: A | Discharge status: Home / Self Care | Payer: Self-pay | Source: Ambulatory Visit | Attending: Obstetrics and Gynecology | Admitting: Obstetrics and Gynecology

## 2020-09-25 VITALS — BP 112/64 | Wt 258.6 lb

## 2020-09-25 DIAGNOSIS — Z1239 Encounter for other screening for malignant neoplasm of breast: Secondary | ICD-10-CM

## 2020-09-25 DIAGNOSIS — N644 Mastodynia: Secondary | ICD-10-CM

## 2020-09-25 DIAGNOSIS — R2232 Localized swelling, mass and lump, left upper limb: Secondary | ICD-10-CM

## 2020-09-25 NOTE — Patient Instructions (Signed)
Explained breast self awareness with [facility]. Patient refused Pap smear today. Patient scheduled for Pap smear at the free cervical cancer screening Monday, December 03, 2020 at 0930. Let her know [facility] will cover Pap smears every 3 years unless has a history of abnormal Pap smears. Referred patient to the [facility] for a diagnostic mammogram. Appointment scheduled Tuesday, September 25, 2020 at [place]. Patient aware of appointment and will be there. Kathleen Savage verbalized understanding.  Darran Gabay, Arvil Chaco, RN 9:16 AM    [place]

## 2020-09-25 NOTE — Progress Notes (Addendum)
[name] is a 44 y.o. female who presents to [facility] clinic today with complaint of right diffuse breast pain that occurs three days per month. Patient rates the pain at a 5 out of 10. Patient stated she had a lump on her breast that was painful that burst and drained. Patient stated she still has a reddened area where the lump was located.    Pap Smear: Pap smear not completed today. Last Pap smear was 02/09/2015 at [place] [facility] and was normal. Per patient has no history of an abnormal Pap smear. Last Pap smear result is available in Epic.   Physical exam: Breasts Breasts symmetrical. Observed a reddened external lump left axilla at 2 o'clock 20 cm from the nipple. Observed a reddened area upper outer quadrant right breast. No nipple retraction bilateral breasts. No nipple discharge bilateral breasts. No lymphadenopathy. No lumps palpated bilateral breasts. Complaints of bilateral outer breast pain and left axillary pain on exam.       Pelvic/Bimanual Patient refused Pap smear.   Smoking History: Patient has never smoked.   Patient Navigation: Patient education provided. Access to services provided for patient through [facility] program. Spanish interpreter Kathleen Savage from [facility] provided.    Breast and Cervical Cancer Risk Assessment: Patient has family history of a maternal aunt and two first cousins having breast cancer. Patient has no known genetic mutations or history of radiation treatment to the chest before age 54. Patient does not have history of cervical dysplasia. Patient has history of HIV immunocompromised. Patient has no history of DES exposure in-utero.  Risk Assessment    Risk Scores      09/25/2020   Last edited by: Kathleen Revel, LPN   5-year risk: 0.5 %   Lifetime risk: 6.8 %          A: [facility] exam without pap smear Complaint of right breast pain, lump that burst, and red area on breast.  P: Referred patient to the [facility] for a  diagnostic mammogram. Appointment scheduled Tuesday, September 25, 2020 at [place].  Kathleen Parish, RN 09/25/2020 9:16 AM

## 2020-11-24 IMAGING — US US AXILLARY LEFT
1 series · 7 of 7 positions shown · non-contrast
Comparison: None.

CLINICAL DATA: 44-year-old presenting with focal pain and erythema
involving the skin of the UPPER OUTER RIGHT breast. The patient
states that she had purulent drainage from this lesion that improved
after antibiotic therapy but did not completely resolve. She
describes similar lesions in the LEFT axilla. This is the patient's
initial baseline mammogram.

Family history of breast cancer in 2 maternal aunts and 2 maternal
Simeoni.
EXAM:
DIGITAL DIAGNOSTIC BILATERAL MAMMOGRAM WITH CAD AND TOMO
ULTRASOUND RIGHT BREAST
ULTRASOUND LEFT AXILLA

[Series 1: us axillary left · 0.06mm/px · 7 of 7 slices shown]
[im 1/7]
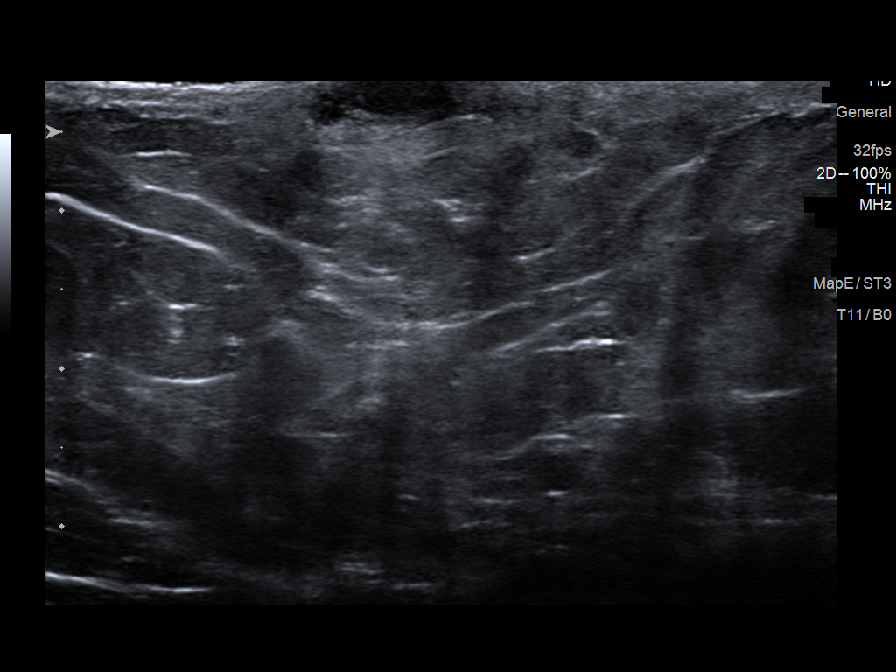
[im 2/7]
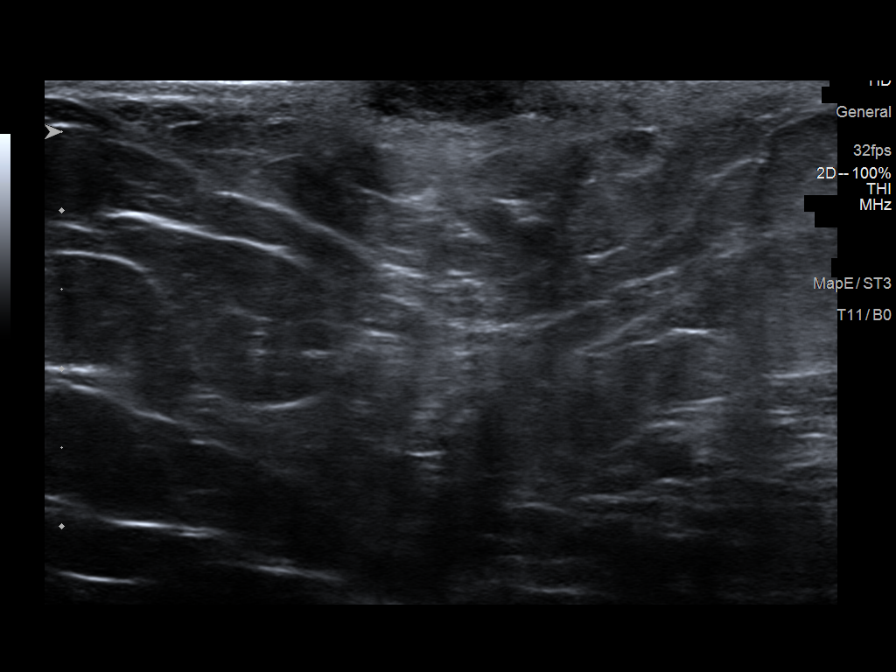
[im 3/7]
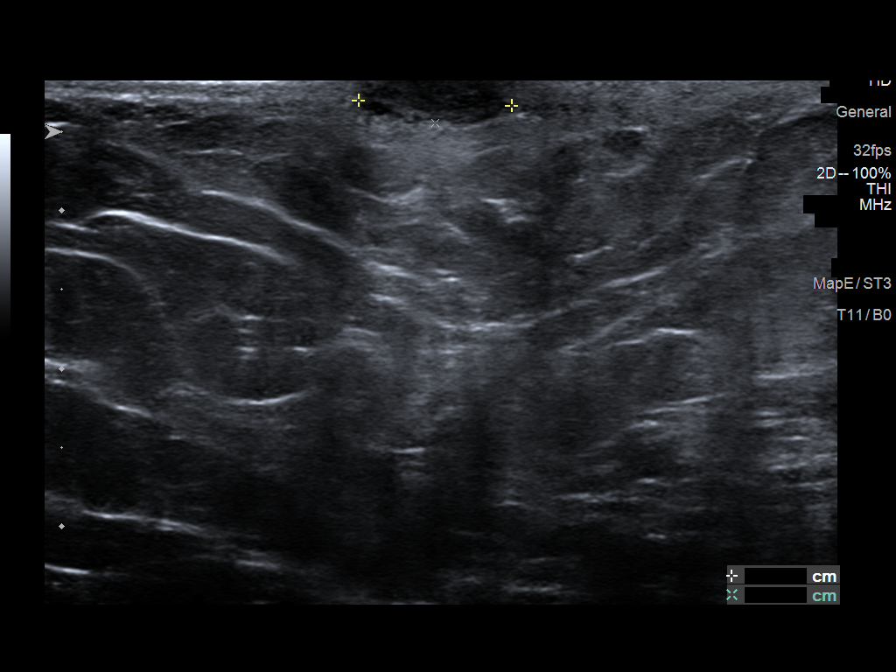
[im 4/7]
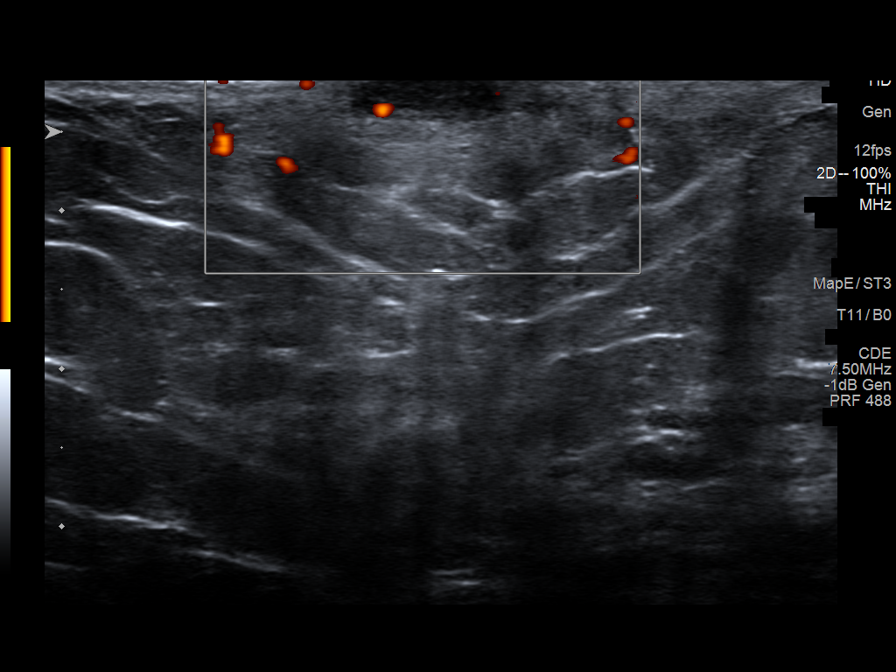
[im 5/7]
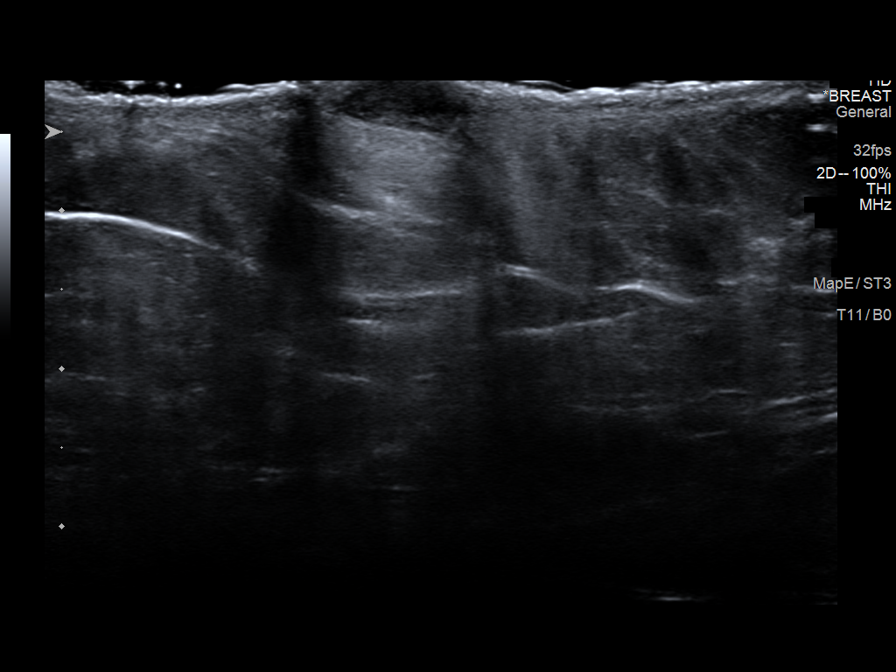
[im 6/7]
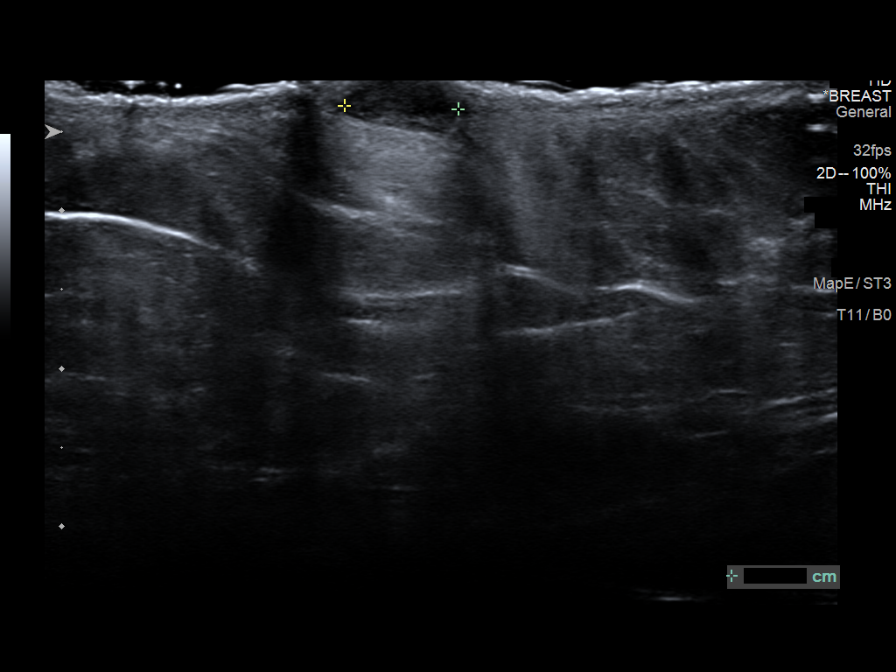
[im 7/7]
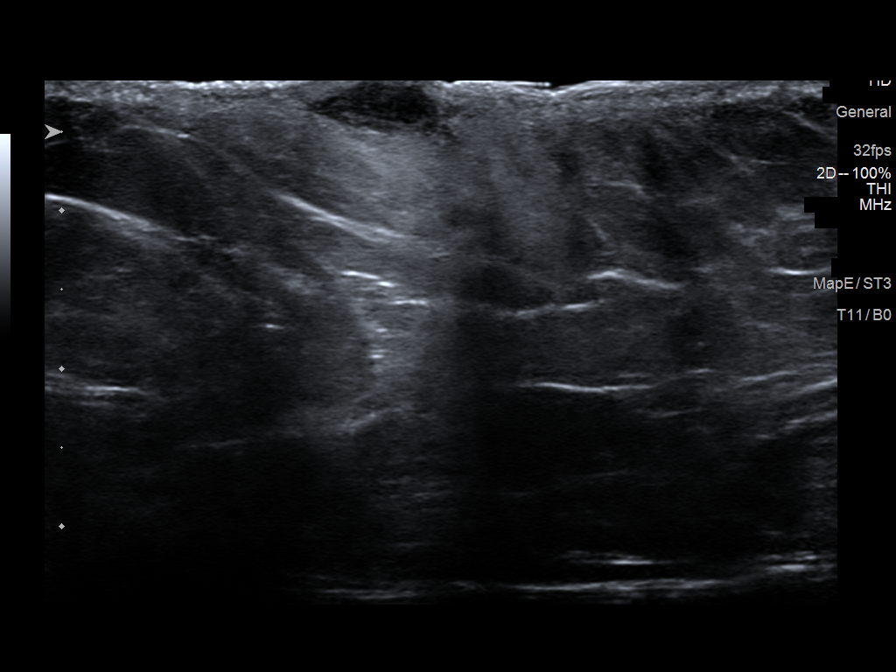

[7 of 7 positions shown; findings below may reference images not displayed]

ACR Breast Density Category b: There are scattered areas of
fibroglandular density.
FINDINGS: Tomosynthesis and synthesized full field CC and MLO views of both
breasts were obtained. Mammographic images were processed with CAD.

No findings suspicious for malignancy in either breast.

Targeted RIGHT breast ultrasound is performed, showing edema and
focal thickening of the skin the RIGHT breast at the 10 o'clock
position approximately 3 cm from the nipple in the area of clinical
concern. The skin measures up to 3 mm in thickness. The underlying
breast tissues are normal in appearance.

Sonographic evaluation of the LEFT axilla demonstrates a
circumscribed oval parallel hypoechoic mass at in the skin which
measures approximately 1.0 x 0.3 x 0.7 cm, demonstrating posterior
acoustic enhancement and demonstrating mild hyperemia on power
Doppler evaluation. There is no pathologic lymphadenopathy.

On correlative physical exam, there is erythema involving the skin
of the UPPER OUTER RIGHT breast spanning approximately 3 cm. There
is no palpable mass in this location.

Visible and palpable soft fluctuant lesions are present in the skin
of the LEFT axilla. There is no palpable LEFT axillary
lymphadenopathy.
IMPRESSION: 1. No mammographic or sonographic evidence of malignancy involving
the RIGHT breast.
2. No mammographic evidence of malignancy involving the LEFT breast.
3. Edema/mild thickening involving the skin of the UPPER OUTER RIGHT
breast and benign sebaceous cyst/epidermoid cyst versus hidradenitis
involving the skin of the LEFT axilla.

RECOMMENDATION:
Screening mammogram in one year.(Code:[id number]-[id number])

I have discussed the findings and recommendations with the patient.
If applicable, a reminder letter will be sent to the patient
regarding the next appointment.

BI-RADS CATEGORY  2: Benign.

## 2020-12-03 ENCOUNTER — Ambulatory Visit: Payer: Self-pay

## 2020-12-13 ENCOUNTER — Other Ambulatory Visit: Payer: Self-pay

## 2020-12-13 ENCOUNTER — Ambulatory Visit: Payer: Self-pay

## 2021-01-03 ENCOUNTER — Encounter: Payer: Self-pay | Admitting: Internal Medicine

## 2021-02-11 ENCOUNTER — Other Ambulatory Visit: Payer: Self-pay | Admitting: *Deleted

## 2021-02-11 ENCOUNTER — Other Ambulatory Visit: Payer: Self-pay

## 2021-02-11 DIAGNOSIS — Z124 Encounter for screening for malignant neoplasm of cervix: Secondary | ICD-10-CM

## 2021-02-11 NOTE — Progress Notes (Signed)
Patient: Kathleen Savage           Date of Birth: 1976/09/02           MRN: [id number] Visit Date: 02/11/2021 PCP: Patient, No Pcp Per (Inactive)  Cervical Cancer Screening Do you smoke?: No Have you ever had or been told you have an allergy to latex products?: No Marital status: Married  Cervical Exam  Abnormal Observations: Cervix friable. Recommendations: Last Pap smear was 02/09/2015 at [facility] and was normal. Per patient has no history of an abnormal Pap smear. Let patient know if today's Pap smear is normal and HPV negative there her next Pap smear will be due in 5 years. Informed patient that will follow-up with her within the next couple of weeks with results of her Pap smear by phone or letter.    Used Spanish interpreter [employer] from [place].  Patient's History Patient Active Problem List   Diagnosis Date Noted  . Left knee pain 08/01/2020  . Bell's palsy 02/09/2018  . Rash 06/13/2014  . Elevated liver enzymes 11/29/2013  . Dyslipidemia 01/25/2013  . Hyperglycemia 01/25/2013  . Multiple lipomas 01/25/2013  . Morbid obesity ([place]) 05/07/2010  . LOW BACK PAIN, CHRONIC 11/28/2008  . ALOPECIA 08/03/2008  . 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 PAP SMEAR, ABNORMAL 12/01/2006  . Human immunodeficiency virus (HIV) disease ([place]) 09/14/2006   Past Medical History:  Diagnosis Date  . Glucose intolerance (pre-diabetes)   . HIV infection ([place])   . Hyperlipidemia     Family History  Problem Relation Age of Onset  . Cancer Maternal Aunt        Breast Cancer  . Breast cancer Maternal Aunt   . Cancer Cousin        Breast cancer - maternal cousin  . Breast cancer Cousin   . Cancer Cousin        breast cancer  . Breast cancer Cousin   . Cancer Cousin        breast cancer  . Cancer Maternal Aunt        stomach cancer  . Breast cancer Maternal Aunt   . Hypertension Mother   . Stroke Father   . Cancer Sister   . Uterine cancer Sister     Social History    Occupational History  . Not on file  Tobacco Use  . Smoking status: Never Smoker  . Smokeless tobacco: Never Used  Vaping Use  . Vaping Use: Never used  Substance and Sexual Activity  . Alcohol use: Yes    Alcohol/week: 3.0 standard drinks    Types: 1 Glasses of wine, 1 Cans of beer, 1 Shots of liquor per week    Comment: occasional  . Drug use: No  . Sexual activity: Yes    Partners: Male    Birth control/protection: Condom    Comment: given condoms

## 2021-02-13 LAB — CYTOLOGY - PAP
Comment: NEGATIVE
Diagnosis: NEGATIVE
High risk HPV: NEGATIVE

## 2021-02-20 ENCOUNTER — Telehealth: Payer: Self-pay

## 2021-02-20 NOTE — Telephone Encounter (Signed)
Via Lavon Paganini, Spanish Interpreter, Patient informed negative Pap/HPV results. Patient verbalized understanding. (Late documentation from 02/19/2021).

## 2021-06-11 ENCOUNTER — Other Ambulatory Visit: Payer: Self-pay

## 2021-06-17 ENCOUNTER — Other Ambulatory Visit: Payer: Self-pay | Admitting: Internal Medicine

## 2021-06-17 DIAGNOSIS — B20 Human immunodeficiency virus [HIV] disease: Secondary | ICD-10-CM

## 2021-06-19 ENCOUNTER — Other Ambulatory Visit: Payer: Self-pay

## 2021-06-19 ENCOUNTER — Ambulatory Visit: Payer: Self-pay

## 2021-06-20 DIAGNOSIS — H18603 Keratoconus, unspecified, bilateral: Secondary | ICD-10-CM | POA: Insufficient documentation

## 2021-06-25 ENCOUNTER — Encounter: Payer: Self-pay | Admitting: Internal Medicine

## 2021-06-25 ENCOUNTER — Ambulatory Visit: Payer: Self-pay | Admitting: Internal Medicine

## 2021-06-25 ENCOUNTER — Other Ambulatory Visit: Payer: Self-pay

## 2021-06-25 DIAGNOSIS — H18603 Keratoconus, unspecified, bilateral: Secondary | ICD-10-CM

## 2021-06-25 DIAGNOSIS — B20 Human immunodeficiency virus [HIV] disease: Secondary | ICD-10-CM

## 2021-06-25 MED ORDER — BIKTARVY 50-200-25 MG PO TABS: 1.0000 | ORAL_TABLET | Freq: Every day | ORAL | 11 refills | Status: DC

## 2021-06-25 NOTE — Progress Notes (Signed)
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   DICLOFENAC SODIUM (VOLTAREN) 1 % GEL    Apply 4 g topically 4 (four) times daily. To left knee.   DOXYCYCLINE (VIBRAMYCIN) 100 MG CAPSULE    Take 1 capsule (100 mg total) by mouth 2 (two) times daily.  Modified Medications   Modified Medication Previous Medication   BICTEGRAVIR-EMTRICITABINE-TENOFOVIR AF (BIKTARVY) 50-200-25 MG TABS TABLET BIKTARVY 50-200-25 MG TABS tablet      Take 1 tablet by mouth daily.    TAKE 1 TABLET BY MOUTH DAILY  Discontinued Medications   No medications on file    Subjective: Kathleen Savage is in for her routine HIV follow-up visit.  She denies any problems obtaining, taking or tolerating her Biktarvy.  She thinks she has only missed 1 dose in the past year.  She continues to work at [employer] and is on her feet for up to 10 hours each day at work.  She has started wearing support stockings.  She has been fitted with new, larger contact lenses because her keratoconus is getting worse.  She may need a corneal transplant.  She tells me she is going to be a grandmother.  She is expecting her first granddaughter in December.  Review of  Systems: Review of Systems  Constitutional:  Negative for fever and weight loss.  Eyes:        As noted in HPI.  Respiratory:  Negative for cough and shortness of breath.   Cardiovascular:  Negative for chest pain.  Musculoskeletal:  Positive for joint pain.   Past Medical History:  Diagnosis Date   Glucose intolerance (pre-diabetes)    HIV infection ([facility])    Hyperlipidemia     Social History   Tobacco Use   Smoking status: Never   Smokeless tobacco: Never  Vaping Use   Vaping Use: Never used  Substance Use Topics   Alcohol use: Yes    Alcohol/week: 3.0 standard drinks    Types: 1 Glasses of wine, 1 Cans of beer, 1 Shots of liquor per week    Comment: occasional   Drug use: No    Family History  Problem Relation Age of Onset   Cancer Maternal Aunt        Breast Cancer   Breast cancer Maternal Aunt    Cancer Cousin        Breast cancer - maternal cousin   Breast cancer Cousin    Cancer Cousin        breast cancer   Breast cancer Cousin    Cancer Cousin  breast cancer   Cancer Maternal Aunt        stomach cancer   Breast cancer Maternal Aunt    Hypertension Mother    Stroke Father    Cancer Sister    Uterine cancer Sister     No Known Allergies  Health Maintenance  Topic Date Due   COVID-19 Vaccine (1) Never done   TETANUS/TDAP  Never done   INFLUENZA VACCINE  06/10/2021   PAP SMEAR-Modifier  02/11/2022   Pneumococcal Vaccine 31-42 Years old (4 - PPSV23 or PCV20) 07/20/2042   Hepatitis C Screening  Completed   HIV Screening  Completed   HPV VACCINES  Aged Out    Objective:  Vitals:   06/25/21 1343  BP: 125/85  Pulse: 73  Temp: 98 F (36.7 C)  TempSrc: Oral  Weight: 260 lb (117.9 kg)   Body mass index is 47.55 kg/m.  Physical Exam Constitutional:      Comments: Her spirits are good.  Cardiovascular:     Rate and Rhythm: Normal rate and regular rhythm.     Heart sounds: No murmur heard. Pulmonary:     Effort: Pulmonary effort is  normal.     Breath sounds: Normal breath sounds.  Musculoskeletal:        General: No swelling or tenderness.     Right lower leg: No edema.     Left lower leg: No edema.  Skin:    Findings: No rash.  Psychiatric:        Mood and Affect: Mood normal.    Lab Results Lab Results  Component Value Date   WBC 7.8 07/19/2020   HGB 12.3 07/19/2020   HCT 38.1 07/19/2020   MCV 90.9 07/19/2020   PLT 379 07/19/2020    Lab Results  Component Value Date   CREATININE 0.72 07/19/2020   BUN 13 07/19/2020   NA 138 07/19/2020   K 3.8 07/19/2020   CL 105 07/19/2020   CO2 21 (L) 07/19/2020    Lab Results  Component Value Date   ALT 27 05/22/2020   AST 18 05/22/2020   ALKPHOS 46 01/06/2017   BILITOT 0.4 05/22/2020    Lab Results  Component Value Date   CHOL 190 05/22/2020   HDL 51 05/22/2020   LDLCALC 117 (H) 05/22/2020   TRIG 113 05/22/2020   CHOLHDL 3.7 05/22/2020   Lab Results  Component Value Date   LABRPR NON-REACTIVE 05/22/2020   HIV 1 RNA Quant (copies/mL)  Date Value  05/22/2020 <20 DETECTED (A)  03/15/2019 <20 NOT DETECTED  08/31/2018 <20 NOT DETECTED   CD4 T Cell Abs (/uL)  Date Value  05/22/2020 966  03/15/2019 876  08/31/2018 740     Problem List Items Addressed This Visit       High   Human immunodeficiency virus (HIV) disease ([place])    She continues to have excellent, long-term control of her infection.  She will continue Biktarvy and follow-up after lab work in 1 year.      Relevant Medications   bictegravir-emtricitabine-tenofovir AF (BIKTARVY) 50-200-25 MG TABS tablet   Other Relevant Orders   CBC   T-helper cell (CD4)- ([facility] clinic only)   Comprehensive metabolic panel   Lipid panel   RPR   HIV-1 RNA quant-no reflex-bld     Unprioritized   Keratoconus of both eyes      Michel Bickers, MD [facility] [place] [phone] pager   [phone] cell 06/25/2021,  2:05 PM

## 2021-06-25 NOTE — Assessment & Plan Note (Signed)
She continues to have excellent, long-term control of her infection.  She will continue Biktarvy and follow-up after lab work in 1 year.

## 2021-07-12 ENCOUNTER — Encounter: Payer: Self-pay | Admitting: Internal Medicine

## 2021-07-17 ENCOUNTER — Other Ambulatory Visit: Payer: Self-pay | Admitting: Internal Medicine

## 2021-07-17 DIAGNOSIS — B20 Human immunodeficiency virus [HIV] disease: Secondary | ICD-10-CM

## 2021-08-14 ENCOUNTER — Encounter: Payer: Self-pay | Admitting: Physician Assistant

## 2021-08-14 ENCOUNTER — Ambulatory Visit: Payer: Self-pay | Attending: Physician Assistant | Admitting: Physician Assistant

## 2021-08-14 ENCOUNTER — Other Ambulatory Visit: Payer: Self-pay

## 2021-08-14 VITALS — BP 130/83 | HR 109 | Resp 16 | Wt 261.8 lb

## 2021-08-14 DIAGNOSIS — D179 Benign lipomatous neoplasm, unspecified: Secondary | ICD-10-CM

## 2021-08-14 DIAGNOSIS — M5441 Lumbago with sciatica, right side: Secondary | ICD-10-CM

## 2021-08-14 DIAGNOSIS — G8929 Other chronic pain: Secondary | ICD-10-CM

## 2021-08-14 MED ORDER — MELOXICAM 15 MG PO TABS: 15.0000 mg | ORAL_TABLET | Freq: Every day | ORAL | 0 refills | Status: DC

## 2021-08-14 MED ORDER — CYCLOBENZAPRINE HCL 10 MG PO TABS: 10.0000 mg | ORAL_TABLET | Freq: Every day | ORAL | 1 refills | Status: DC

## 2021-08-14 NOTE — Progress Notes (Signed)
Patient ID: Kathleen Savage, female   DOB: November 25, 1975, 45 y.o.   MRN: [id number]   Kathleen Savage, is a 45 y.o. female  UMP:[id number]  QMG:[id number]  DOB - 04-30-1976  Chief Complaint  Patient presents with   Leg Pain       Subjective:   Kathleen Savage is a 45 y.o. female here today for multiple lipomas she wants to see someone about.  She has one on her R forearm, one on her R lower back and one on her L thigh.  She has had these for a while.    Also having R lower and mid back pain that radiates into her legs.  Worse on R than left.  NKI.  This has been going on for 5-6 months.  Occasionally takes tylenol with minimal relief.  She has to lift a lot at work.  She works at [employer].    Saw ID 06/2021  Patient has No headache, No chest pain, No abdominal pain - No Nausea, No new weakness tingling or numbness, No Cough - SOB.  No urinary  No problems updated.  ALLERGIES: No Known Allergies  PAST MEDICAL HISTORY: Past Medical History:  Diagnosis Date   Glucose intolerance (pre-diabetes)    HIV infection ([place])    Hyperlipidemia     MEDICATIONS AT HOME: Prior to Admission medications   Medication Sig Start Date End Date Taking? Authorizing Provider  cyclobenzaprine (FLEXERIL) 10 MG tablet Take 1 tablet (10 mg total) by mouth at bedtime. Prn leg/back pain 08/14/21  Yes Argentina Donovan, PA-C  meloxicam (MOBIC) 15 MG tablet Take 1 tablet (15 mg total) by mouth daily. Prn pain 08/14/21  Yes Argentina Donovan, PA-C  bictegravir-emtricitabine-tenofovir AF (BIKTARVY) 50-200-25 MG TABS tablet Take 1 tablet by mouth daily. 06/25/21   Michel Bickers, MD  calcium carbonate (TUMS - DOSED IN MG ELEMENTAL CALCIUM) 500 MG chewable tablet Chew 1 tablet by mouth daily. Patient not taking: Reported on 06/25/2021    [provider]  diclofenac Sodium (VOLTAREN) 1 % GEL Apply 4 g topically 4 (four) times daily. To left knee. 07/20/20   Antonietta Breach, PA-C  doxycycline  (VIBRAMYCIN) 100 MG capsule Take 1 capsule (100 mg total) by mouth 2 (two) times daily. Patient not taking: Reported on 06/25/2021 07/20/20   Antonietta Breach, PA-C    ROS: Neg HEENT Neg resp Neg cardiac Neg GI Neg GU Neg psych Neg neuro  Objective:   Vitals:   08/14/21 1450  BP: 130/83  Pulse: (!) 109  Resp: 16  SpO2: 97%  Weight: 261 lb 12.8 oz (118.8 kg)   Exam General appearance : Awake, alert, not in any distress. Speech Clear. Not toxic looking HEENT: Atraumatic and Normocephalic,Neck: Supple, no JVD. No cervical lymphadenopathy.  Chest: Good air entry bilaterally, CTAB.  No rales/rhonchi/wheezing CVS: S1 S2 regular, no murmurs.  Back/leg-exam limited due to obesity and pain.  No red flags Extremities: B/L Lower Ext shows no edema, both legs are warm to touch Neurology: Awake alert, and oriented X 3, CN II-XII intact, Non focal Skin: No Rash  Data Review Lab Results  Component Value Date   HGBA1C 5.4 01/25/2013    Assessment & Plan   1. Multiple lipomas - Ambulatory referral to General Surgery  2. Chronic low back pain with right-sided sciatica, unspecified back pain laterality No red flags today.  Note for work no lifting >10 pounds - meloxicam (MOBIC) 15 MG tablet; Take 1 tablet (15 mg total) by mouth daily.  Prn pain  Dispense: 30 tablet; Refill: 0 - cyclobenzaprine (FLEXERIL) 10 MG tablet; Take 1 tablet (10 mg total) by mouth at bedtime. Prn leg/back pain  Dispense: 30 tablet; Refill: 1 - Ambulatory referral to Orthopedic Surgery    Patient have been counseled extensively about nutrition and exercise. Other issues discussed during this visit include: low cholesterol diet, weight control and daily exercise, foot care, annual eye examinations at Ophthalmology, importance of adherence with medications and regular follow-up. We also discussed long term complications of uncontrolled diabetes and hypertension.   Return in about 4 months (around 12/15/2021) for assign  PCP.  The patient was given clear instructions to go to ER or return to medical center if symptoms don't improve, worsen or new problems develop. The patient verbalized understanding. The patient was told to call to get lab results if they haven't heard anything in the next week.      Freeman Caldron, PA-C [facility], [place]   08/14/2021, 3:04 PM

## 2021-08-14 NOTE — Patient Instructions (Signed)
Lipoma Lipoma Un lipoma es un tumor no canceroso (benigno) formado por clulas de grasa. Es un tipo muy frecuente de [employer] tejidos blandos. Por lo general, los lipomas se encuentran debajo de la piel (subcutneos). Pueden aparecer en cualquier tejido del cuerpo que contenga grasa. Las zonas en las que los lipomas aparecen con mayor frecuencia incluyen la espalda, los [place], los hombros, las nalgas y los muslos. Los lipomas crecen lentamente y, en general, son indoloros. La mayora de los lipomas no causan problemas y no requieren Clinical research associate. Cules son las causas? Se desconoce la causa de esta afeccin. Qu incrementa el riesgo? Es ms probable que sufra esta afeccin si: Tiene entre 28 y 58 aos de edad. Tiene antecedentes familiares de lipomas. Cules son los signos o sntomas? Por lo general, el lipoma aparece como una pequea protuberancia redonda debajo de la piel. En la [employer], el bulto tiene las siguientes caractersticas: Se siente suave o elstico. No causa dolor ni otros sntomas. Sin embargo, si el lipoma se encuentra en un rea en la que hace presin [employer] nervios, puede causar dolor u otros sntomas. Cmo se diagnostica? Por lo general, el lipoma puede diagnosticarse con un examen fsico. Tambin pueden hacerle estudios para confirmar el diagnstico y Paramedic otras afecciones. Las pruebas pueden incluir las siguientes: Pruebas de diagnstico por imgenes, como una exploracin por tomografa computarizada (TC) o una resonancia magntica (RM). Extraccin de [place] de tejido para analizar con un microscopio (biopsia). Cmo se trata? El tratamiento de esta afeccin depende del tamao del lipoma y si causa algn sntoma. Los lipomas pequeos que no causan problemas no requieren tratamiento. Si un lipoma se agranda o causa problemas, puede realizarse [place] para extirpar el lipoma. Los lipomas tambin pueden extirparse para mejorar el  aspecto. En la [employer], [place] procedimiento se realiza despus de aplicar un medicamento que adormece el rea (anestesia local). Pueden realizarse una liposuccin para reducir el tamao del lipoma antes de que se lo extraigan quirrgicamente, o bien se la puede realizar para extirpar el lipoma. Los lipomas se extirpan con este mtodo para limitar el tamao de la incisin y las cicatrices. Se introduce un tubo de liposuccin a travs de una pequea incisin en el lipoma y el contenido del lipoma se extrae a travs del tubo mediante succin. Siga estas indicaciones en casa: Controle el lipoma para detectar cambios. Concurra a todas las visitas de seguimiento como se lo haya indicado el mdico. Esto es importante. Comunquese con un mdico si: El lipoma se agranda o se endurece. El lipoma comienza a causarle dolor, se enrojece o se hincha cada vez ms. Estos podran ser signos de infeccin o de una afeccin ms grave. Solicite ayuda de inmediato si: Siente hormigueo o adormecimiento en el rea cerca del lipoma. Esto podra indicar que el lipoma es lo que causa dao nervioso. Resumen Un lipoma es un tumor no canceroso formado por clulas de grasa. La mayora de los lipomas no causan problemas y no requieren Clinical research associate. Si un lipoma se agranda o causa problemas, puede realizarse [place] para extirpar el lipoma. Comunquese con un mdico si el lipoma se agranda o se endurece, o si empieza a dolor, se pone de color rojo o se hincha cada vez ms. El dolor, el enrojecimiento y la hinchazn podran ser signos de infeccin o de una afeccin ms grave. Esta informacin no tiene Marine scientist el consejo del mdico. Asegrese de hacerle al mdico  cualquier pregunta que tenga. Document Revised: 08/23/2019 Document Reviewed: 08/23/2019 Elsevier Patient Education  2022 [employer].

## 2021-08-20 ENCOUNTER — Ambulatory Visit (HOSPITAL_BASED_OUTPATIENT_CLINIC_OR_DEPARTMENT_OTHER): Payer: No Typology Code available for payment source | Admitting: Orthopaedic Surgery

## 2021-08-26 ENCOUNTER — Ambulatory Visit: Payer: Self-pay | Admitting: Surgery

## 2021-08-27 ENCOUNTER — Other Ambulatory Visit: Payer: Self-pay

## 2021-08-27 ENCOUNTER — Other Ambulatory Visit (HOSPITAL_BASED_OUTPATIENT_CLINIC_OR_DEPARTMENT_OTHER): Payer: Self-pay

## 2021-08-27 ENCOUNTER — Ambulatory Visit (HOSPITAL_BASED_OUTPATIENT_CLINIC_OR_DEPARTMENT_OTHER): Payer: Self-pay | Admitting: Orthopaedic Surgery

## 2021-08-27 VITALS — Ht 62.0 in | Wt 260.0 lb

## 2021-08-27 DIAGNOSIS — M5441 Lumbago with sciatica, right side: Secondary | ICD-10-CM

## 2021-08-27 DIAGNOSIS — G8929 Other chronic pain: Secondary | ICD-10-CM

## 2021-08-27 MED ORDER — METHYLPREDNISOLONE 4 MG PO TBPK
ORAL_TABLET | ORAL | 0 refills | Status: DC
  Filled 2021-08-27: qty 21, 6d supply, fill #0

## 2021-08-27 NOTE — Progress Notes (Signed)
Chief Complaint: lower back pain     History of Present Illness:   Pain Score: 9/10  Kathleen Savage is a 45 y.o. female with low back pain now going on for several months.  She states that the pain is worse when she is on her feet after a long day.  She is having the pain radiates down the right back of the leg and around to the left front of her leg.  She works at [employer] and is required to lift heavy things.  She does have the pain wakes her up at night.  She is taking Mobic as well as cyclobenzaprine which helps somewhat.  She says of the right side is worse than the left side.  She has not had any physical therapy.    Surgical History:   None  PMH/PSH/Family History/Social History/Meds/Allergies:    Past Medical History:  Diagnosis Date   Glucose intolerance (pre-diabetes)    HIV infection ([place])    Hyperlipidemia    No past surgical history on file. Social History   Socioeconomic History   Marital status: Married    Spouse name: Not on file   Number of children: 2   Years of education: Not on file   Highest education level: 9th grade  Occupational History   Not on file  Tobacco Use   Smoking status: Never   Smokeless tobacco: Never  Vaping Use   Vaping Use: Never used  Substance and Sexual Activity   Alcohol use: Yes    Alcohol/week: 3.0 standard drinks    Types: 1 Glasses of wine, 1 Cans of beer, 1 Shots of liquor per week    Comment: occasional   Drug use: No   Sexual activity: Yes    Partners: Male    Birth control/protection: Condom    Comment: given condoms  Other Topics Concern   Not on file  Social History Narrative   Not on file   Social Determinants of Health   Financial Resource Strain: Not on file  Food Insecurity: Not on file  Transportation Needs: No Transportation Needs   Lack of Transportation (Medical): No   Lack of Transportation (Non-Medical): No  Physical Activity: Not on file  Stress:  Not on file  Social Connections: Not on file   Family History  Problem Relation Age of Onset   Cancer Maternal Aunt        Breast Cancer   Breast cancer Maternal Aunt    Cancer Cousin        Breast cancer - maternal cousin   Breast cancer Cousin    Cancer Cousin        breast cancer   Breast cancer Cousin    Cancer Cousin        breast cancer   Cancer Maternal Aunt        stomach cancer   Breast cancer Maternal Aunt    Hypertension Mother    Stroke Father    Cancer Sister    Uterine cancer Sister    No Known Allergies Current Outpatient Medications  Medication Sig Dispense Refill   methylPREDNISolone (MEDROL DOSEPAK) 4 MG TBPK tablet Take per packet instructions 1 tablet 0   bictegravir-emtricitabine-tenofovir AF (BIKTARVY) 50-200-25 MG TABS tablet Take 1 tablet by mouth daily. 30 tablet 11   calcium carbonate (  TUMS - DOSED IN MG ELEMENTAL CALCIUM) 500 MG chewable tablet Chew 1 tablet by mouth daily. (Patient not taking: Reported on 06/25/2021)     cyclobenzaprine (FLEXERIL) 10 MG tablet Take 1 tablet (10 mg total) by mouth at bedtime. Prn leg/back pain 30 tablet 1   diclofenac Sodium (VOLTAREN) 1 % GEL Apply 4 g topically 4 (four) times daily. To left knee. 150 g 1   doxycycline (VIBRAMYCIN) 100 MG capsule Take 1 capsule (100 mg total) by mouth 2 (two) times daily. (Patient not taking: Reported on 06/25/2021) 20 capsule 0   meloxicam (MOBIC) 15 MG tablet Take 1 tablet (15 mg total) by mouth daily. Prn pain 30 tablet 0   No current facility-administered medications for this visit.   No results found.  Review of Systems:   A ROS was performed including pertinent positives and negatives as documented in the HPI.  Physical Exam :   Constitutional: NAD and appears stated age Neurological: Alert and oriented Psych: Appropriate affect and cooperative Height 5\' 2"  (1.575 m), weight 260 lb (117.9 kg).   Comprehensive Musculoskeletal Exam:   Pain radiating down the right leg.   On muscle testing she has equal full strength of bilateral knee extension flexion ankle extension flexion as well as EHL.  Sensation is intact in all distributions down the legs bilaterally.  Negative straight leg raise bilaterally.  Imaging:    I personally reviewed and interpreted the radiographs.   Assessment:   45 year old female with lower back pain consistent with lumbar stenosis.  Ultimately I have advised that we should begin treatment with a steroid taper as well as physical therapy to get her core and back strengthening.  This will be particularly helpful given her job on her feet all day at [employer] as well as lifting heavy things.  I did also advise and show her various lumbar support braces which she may be able to purchase online.  We will see her back in 4 weeks if she has no improvement with a home exercise core and back program.  We will consider MRI at that time  Plan :    -Return in 4 weeks if no improvement with a home exercise plan, Medrol Dosepak    I personally saw and evaluated the patient, and participated in the management and treatment plan.  Vanetta Mulders, MD Attending Physician, Orthopedic Surgery  This document was dictated using Dragon voice recognition software. A reasonable attempt at proof reading has been made to minimize errors.

## 2021-08-29 ENCOUNTER — Other Ambulatory Visit: Payer: Self-pay | Admitting: Pharmacist

## 2021-08-29 DIAGNOSIS — B20 Human immunodeficiency virus [HIV] disease: Secondary | ICD-10-CM

## 2021-08-29 MED ORDER — BIKTARVY 50-200-25 MG PO TABS: 1.0000 | ORAL_TABLET | Freq: Every day | ORAL | 0 refills | Status: AC

## 2021-08-29 NOTE — Progress Notes (Signed)
Medication Samples have been provided to the patient.  Drug name: Biktarvy        Strength: 50/200/25 mg       Qty: 2 bottles (14 tablets)   LOT: [id number]   Exp.Date: 04/11/2023  Dosing instructions: Take one tablet by mouth once daily  The patient has been instructed regarding the correct time, dose, and frequency of taking this medication, including desired effects and most common side effects.   Mazin Emma L. Eber Hong, PharmD, BCIDP, AAHIVP, CPP Clinical Pharmacist Practitioner Infectious Diseases [place] 10/22/2020, 10:07 AM

## 2021-09-10 ENCOUNTER — Other Ambulatory Visit: Payer: Self-pay | Admitting: Physician Assistant

## 2021-09-10 DIAGNOSIS — G8929 Other chronic pain: Secondary | ICD-10-CM

## 2021-09-10 NOTE — Telephone Encounter (Signed)
Requested medications are due for refill today yes  Requested medications are on the active medication list yes  Last refill 08/14/21  Last visit 08/14/21  Future visit scheduled 12/23/21  Notes to clinic failed protocol of lab work within 360 days, please assess.  Requested Prescriptions  Pending Prescriptions Disp Refills   meloxicam (MOBIC) 15 MG tablet [Pharmacy Med Name: MELOXICAM 15MG  TABLETS] 30 tablet 0    Sig: TAKE 1 TABLET(15 MG) BY MOUTH DAILY AS NEEDED FOR PAIN     Analgesics:  COX2 Inhibitors Failed - 09/10/2021  3:37 AM      Failed - HGB in normal range and within 360 days    Hemoglobin  Date Value Ref Range Status  07/19/2020 12.3 12.0 - 15.0 g/dL Final          Failed - Cr in normal range and within 360 days    Creat  Date Value Ref Range Status  05/22/2020 0.64 0.50 - 1.10 mg/dL Final   Creatinine, Ser  Date Value Ref Range Status  07/19/2020 0.72 0.44 - 1.00 mg/dL Final          Passed - Patient is not pregnant      Passed - Valid encounter within last 12 months    Recent Outpatient Visits           3 weeks ago Multiple lipomas   Geronimo [place], Dionne Bucy, [place]   1 year ago Hospital discharge follow-up   Pelham, Amy J, NP       Future Appointments             In 3 months Gildardo Pounds, NP [place]

## 2021-09-11 ENCOUNTER — Ambulatory Visit: Payer: Self-pay | Admitting: Surgery

## 2021-09-16 NOTE — Therapy (Incomplete)
OUTPATIENT PHYSICAL THERAPY THORACOLUMBAR EVALUATION   Patient Name: Kathleen Savage MRN: [id number] DOB:Apr 18, 1976, 45 y.o., female Today's Date: 09/16/2021    Past Medical History:  Diagnosis Date   Glucose intolerance (pre-diabetes)    HIV infection ([place])    Hyperlipidemia    No past surgical history on file. 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   Patellofemoral pain syndrome 08/01/2020   Bell's palsy 02/09/2018   Elevated liver enzymes 11/29/2013   Dyslipidemia 01/25/2013   Hyperglycemia 01/25/2013   Multiple lipomas 01/25/2013   Morbid obesity ([place]) 05/07/2010   LOW BACK PAIN, CHRONIC 11/28/2008   ALOPECIA 08/03/2008   HERPES ZOSTER 12/01/2006   PAP SMEAR, ABNORMAL 12/01/2006   Human immunodeficiency virus (HIV) disease ([place]) 09/14/2006    PCP: Argentina Donovan, PA-C  REFERRING PROVIDER: Vanetta Mulders, MD  REFERRING DIAG: [phone] (ICD-10-CM) - Chronic low back pain with right-sided sciatica, unspecified back pain laterality   THERAPY DIAG:  No diagnosis found.  ONSET DATE: ***  SUBJECTIVE:                                                                                                                                                                                           SUBJECTIVE STATEMENT: *** PERTINENT HISTORY:  Lumbar stenosis, obesity  PAIN:  Are you having pain? {yes/no:20286} VAS scale: ***/10 Pain location: *** Pain orientation: {Pain Orientation:25161}  PAIN TYPE: {type:313116} Pain description: {PAIN DESCRIPTION:21022940}  Aggravating factors: *** Relieving factors: ***  PRECAUTIONS: {Therapy precautions:24002}  WEIGHT BEARING RESTRICTIONS {Yes ***/No:24003}  FALLS:  Has patient fallen in last 6 months? {yes/no:20286}, Number of falls: ***  LIVING ENVIRONMENT: Lives with: {[facility] lives with:25569::"lives with their family"} Lives in: {Lives in:25570} Stairs:  {yes/no:20286}; {Stairs:24000} Has following equipment at home: {Assistive devices:23999}  OCCUPATION: works at [employer]  PLOF: {PLOF:24004}  PATIENT GOALS ***   OBJECTIVE:   DIAGNOSTIC FINDINGS:  Lumbar stenosis  PATIENT SURVEYS:  {rehab surveys:24030}  SCREENING FOR RED FLAGS: Bowel or bladder incontinence: {Yes/No:304960894} Spinal tumors: {Yes/No:304960894} Cauda equina syndrome: {Yes/No:304960894} Compression fracture: {Yes/No:304960894} Abdominal aneurysm: {Yes/No:304960894}  COGNITION:  Overall cognitive status: {cognition:24006}     SENSATION:  Light touch: {intact/deficits:24005}  Stereognosis: {intact/deficits:24005}  Hot/Cold: {intact/deficits:24005}  Proprioception: {intact/deficits:24005}  MUSCLE LENGTH: Hamstrings: Right *** deg; Left *** deg Thomas test: Right *** deg; Left *** deg  POSTURE:  ***  LUMBARAROM/PROM  A/PROM A/PROM  09/16/2021  Flexion   Extension   Right lateral flexion   Left lateral flexion   Right rotation   Left rotation    (Blank rows = not tested)  LE AROM/PROM:  A/PROM Right 09/16/2021 Left 09/16/2021  Hip flexion    Hip extension    Hip abduction    Hip adduction    Hip internal rotation    Hip external rotation    Knee flexion    Knee extension    Ankle dorsiflexion    Ankle plantarflexion    Ankle inversion    Ankle eversion     (Blank rows = not tested)  LE MMT:  MMT Right 09/16/2021 Left 09/16/2021  Hip flexion    Hip extension    Hip abduction    Hip adduction    Hip internal rotation    Hip external rotation    Knee flexion    Knee extension    Ankle dorsiflexion    Ankle plantarflexion    Ankle inversion    Ankle eversion     (Blank rows = not tested)  LUMBAR SPECIAL TESTS:  {lumbar special test:25242}  SPINAL SEGMENTAL MOBILITY ASSESSMENT:  ***  FUNCTIONAL TESTS:  {Functional tests:24029}  GAIT: Distance walked: *** Assistive device utilized: {Assistive  devices:23999} Level of assistance: {Levels of assistance:24026} Comments: ***    TODAY'S TREATMENT  ***   PATIENT EDUCATION:  Education details: *** Person educated: {Person educated:25204} Education method: {Education Method:25205} Education comprehension: {Education Comprehension:25206}   HOME EXERCISE PROGRAM: ***  ASSESSMENT:  CLINICAL IMPRESSION: Patient is a 45 y.o. F who was seen today for physical therapy evaluation and treatment for chronic LBP. Objective impairments include {opptimpairments:25111}. These impairments are limiting patient from {activity limitations:25113}. Personal factors including {Personal factors:25162} are also affecting patient's functional outcome. Patient will benefit from skilled PT to address above impairments and improve overall function.  REHAB POTENTIAL: {rehabpotential:25112}  CLINICAL DECISION MAKING: {clinical decision making:25114}  EVALUATION COMPLEXITY: {Evaluation complexity:25115}   GOALS: Goals reviewed with patient? {yes/no:20286}  SHORT TERM GOALS:  STG Name Target Date Goal status  1 *** Baseline:  {follow up:25551} {GOALSTATUS:25110}  2 *** Baseline:  {follow up:25551} {GOALSTATUS:25110}  3 *** Baseline: {follow up:25551} {GOALSTATUS:25110}  4 *** Baseline: {follow up:25551} {GOALSTATUS:25110}  5 *** Baseline: {follow up:25551} {GOALSTATUS:25110}  6 *** Baseline: {follow up:25551} {GOALSTATUS:25110}  7 *** Baseline: {follow up:25551} {GOALSTATUS:25110}   LONG TERM GOALS:   LTG Name Target Date Goal status  1 *** Baseline: {follow up:25551} {GOALSTATUS:25110}  2 *** Baseline: {follow up:25551} {GOALSTATUS:25110}  3 *** Baseline: {follow up:25551} {GOALSTATUS:25110}  4 *** Baseline: {follow up:25551} {GOALSTATUS:25110}  5 *** Baseline: {follow up:25551} {GOALSTATUS:25110}  6 *** Baseline: {follow up:25551} {GOALSTATUS:25110}  7 *** Baseline: {follow up:25551} {GOALSTATUS:25110}   PLAN: PT  FREQUENCY: {rehab frequency:25116}  PT DURATION: {rehab duration:25117}  PLANNED INTERVENTIONS: {rehab planned interventions:25118::"Therapeutic exercises","Therapeutic activity","Neuro Muscular re-education","Balance training","Gait training","Patient/Family education","Joint mobilization"}  PLAN FOR NEXT SESSION: ***   Selinda Eon 09/16/2021, 8:45 PM

## 2021-09-17 ENCOUNTER — Ambulatory Visit (HOSPITAL_BASED_OUTPATIENT_CLINIC_OR_DEPARTMENT_OTHER): Payer: No Typology Code available for payment source | Admitting: Physical Therapy

## 2021-09-24 ENCOUNTER — Encounter (HOSPITAL_BASED_OUTPATIENT_CLINIC_OR_DEPARTMENT_OTHER): Payer: Self-pay | Admitting: Physical Therapy

## 2021-09-24 ENCOUNTER — Ambulatory Visit (HOSPITAL_BASED_OUTPATIENT_CLINIC_OR_DEPARTMENT_OTHER): Payer: No Typology Code available for payment source | Attending: Orthopaedic Surgery | Admitting: Physical Therapy

## 2021-09-24 ENCOUNTER — Other Ambulatory Visit: Payer: Self-pay

## 2021-09-24 DIAGNOSIS — G8929 Other chronic pain: Secondary | ICD-10-CM | POA: Insufficient documentation

## 2021-09-24 DIAGNOSIS — M545 Low back pain, unspecified: Secondary | ICD-10-CM | POA: Insufficient documentation

## 2021-09-24 DIAGNOSIS — M6281 Muscle weakness (generalized): Secondary | ICD-10-CM | POA: Insufficient documentation

## 2021-09-24 DIAGNOSIS — M5441 Lumbago with sciatica, right side: Secondary | ICD-10-CM | POA: Insufficient documentation

## 2021-09-24 DIAGNOSIS — R262 Difficulty in walking, not elsewhere classified: Secondary | ICD-10-CM | POA: Insufficient documentation

## 2021-09-24 NOTE — Therapy (Signed)
OUTPATIENT PHYSICAL THERAPY THORACOLUMBAR EVALUATION   Patient Name: Kathleen Savage MRN: [id number] DOB:1976-02-17, 45 y.o., female Today's Date: 09/24/2021   PT End of Session - 09/24/21 1426     Visit Number 1    Number of Visits 17    Date for PT Re-Evaluation 12/23/21    Authorization Type General Commercial    PT Start Time 1430    PT Stop Time 1515    PT Time Calculation (min) 45 min    Activity Tolerance Patient tolerated treatment well    Behavior During Therapy WFL for tasks assessed/performed             Past Medical History:  Diagnosis Date   Glucose intolerance (pre-diabetes)    HIV infection ([place])    Hyperlipidemia    History reviewed. No pertinent surgical history. 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   Patellofemoral pain syndrome 08/01/2020   Bell's palsy 02/09/2018   Elevated liver enzymes 11/29/2013   Dyslipidemia 01/25/2013   Hyperglycemia 01/25/2013   Multiple lipomas 01/25/2013   Morbid obesity ([place]) 05/07/2010   LOW BACK PAIN, CHRONIC 11/28/2008   ALOPECIA 08/03/2008   HERPES ZOSTER 12/01/2006   PAP SMEAR, ABNORMAL 12/01/2006   Human immunodeficiency virus (HIV) disease ([place]) 09/14/2006    PCP: Argentina Donovan, PA-C  REFERRING PROVIDER: Vanetta Mulders, MD  REFERRING DIAG: [phone] (ICD-10-CM) - Chronic low back pain with right-sided sciatica, unspecified back pain laterality   THERAPY DIAG:  Pain, lumbar region  Muscle weakness (generalized)  Difficulty walking  ONSET DATE: 06/2021  SUBJECTIVE:  Pt present today without interpreter. Pt states she does not need it today "if you do not use big words."                                                                                                                                                                                         SUBJECTIVE STATEMENT:  Pt states the pain has been going on for several months, likely  due to her occupational demands at [employer] where she has to stand for long hours and lift heavier items. Pt denies specific MOI. She states that the pain is worse when she is on her feet after standing all. She is having the pain travel down bilateral thighs.  Sometimes she will feel weakness into the R LE. She states lifting the R LE into the car has become a problem. She states she is able to stand/walk for up to 2 hours at work and then after, she start having increased. Pt denies NT. She feels she is "45  years old" after a shift at work. Pt denies pain with cough, sneeze, laugh. Pt states sometimes the legs will not hold up her weight. Pt denies BB changes. Pt denies systemic symptoms. Pain goes down to the knee cap.   PERTINENT HISTORY:  obesity, HIV, HLD  PAIN:  Are you having pain? No VAS scale: 0/10 Pain location: L/S Pain orientation: Bilateral LE, ant an post thigh PAIN TYPE: aching Pain description: dull  Aggravating factors: standing, walking, transfers after sitting for too long, squatting, bending Relieving factors: Tylenol, muscle relaxers, resting. Leaning on a cart, slouching  PRECAUTIONS: None  WEIGHT BEARING RESTRICTIONS No  FALLS:  Has patient fallen in last 6 months? 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    OBJECTIVE:   DIAGNOSTIC FINDINGS:  N/A  PATIENT SURVEYS:  FOTO 86 D/C 66 MCII 5pts   SCREENING FOR RED FLAGS: Bowel or bladder incontinence: No Spinal tumors: No Cauda equina syndrome: No Compression fracture: No Abdominal aneurysm: No  COGNITION:  Overall cognitive status: Within functional limits for tasks assessed     SENSATION:  Light  touch: Appears intact     POSTURE:  Exaggerated lumbar lordosis; toe out, forward rounded shoulders  LUMBARAROM/PROM  A/PROM A/PROM  09/24/2021  Flexion 50%   Extension WFL  Right lateral flexion 75% p! On R  Left lateral flexion 75%  Right rotation 65% p! On R  Left rotation 75%   (Blank rows = not tested)  LE AROM/PROM:  Moderately limited with hip flexion, extension, and external rotation by ~30% No pain with LE AROM or PROM  LE MMT:  MMT Right 09/24/2021 Left 09/24/2021  Hip flexion 4+/5 4+/5  Hip extension    Hip abduction 4+/5 4+/5  Hip adduction 4+/5 4+/5  Hip internal rotation 4+/5 4+/5  Hip external rotation 4+/5 4+/5  Knee flexion 4+/5 4+/5  Knee extension 4+/5 4+/5   (Blank rows = not tested)  LUMBAR SPECIAL TESTS:  Straight leg raise test: Negative, Slump test: Negative, SI Compression/distraction test: Negative, and FABER test: Negative Positive Trendelenburg  SPINAL SEGMENTAL MOBILITY ASSESSMENT:  R side L1-5 joint stiffness, decreased glide with UPA Hypertonicity and TTP with R L/S paraspinals and QL Normal palpation on L  FUNCTIONAL TESTS:  5 times sit to stand: requires UE assist to perform STS Step up/down : fwd trunk lean for step up, decreased eccentric lowering strength, reciprocal stepping 4 up/down  GAIT: Distance walked: 10ft Assistive device utilized: None Level of assistance: Complete Independence Comments: exaggerated hip hike, increased trunk rotation    TODAY'S TREATMENT  Exercises Supine Posterior Pelvic Tilt - 2 x daily - 7 x weekly - 2 sets - 10 reps - 2 hold Seated Quadratus Lumborum Stretch in Chair - 2 x daily - 7 x weekly - 1 sets - 3 reps - 30 hold Standing Lumbar Spine Flexion Stretch Counter - 3 x daily - 7 x weekly - 1 sets - 5 reps - 10 hold Standing Lumbar Extension - 3 x daily - 7 x weekly - 1 sets - 5 reps - 2 hold   PATIENT EDUCATION:  Education details: MOI, diagnosis, prognosis, anatomy, exercise  progression, DOMS expectations, muscle firing,  envelope  of function, HEP, POC  Person educated: Patient Education method: Explanation, Demonstration, Tactile cues, Verbal cues, and Handouts Education comprehension: verbalized understanding, returned demonstration, verbal cues required, tactile cues required, and needs further education   HOME EXERCISE PROGRAM: Access Code: [email or web address] URL: [email or web address] Date: 09/24/2021 Prepared by: Daleen Bo  ASSESSMENT:  CLINICAL IMPRESSION: Patient is a 45 y.o. Spanish speaking female who was seen today for physical therapy evaluation and treatment for cc of LBP. Pt's s/s appear consistent with a mechanical LBP at this time. Clinical testing does not suggest internal derangement at this time. R sided L/S paraspinals with spasm during session. Pt does not appear to have flexion or extension prefers. Pain is moderately sensitive and irritable on R side. Objective impairments include Abnormal gait, decreased activity tolerance, decreased balance, decreased endurance, decreased mobility, difficulty walking, decreased ROM, decreased strength, hypomobility, increased muscle spasms, impaired flexibility, improper body mechanics, postural dysfunction, obesity, and pain. These impairments are limiting patient from cleaning, occupation, laundry, yard work, and shopping. Personal factors including Age, Behavior pattern, Fitness, Profession, Time since onset of injury/illness/exacerbation, and 1 comorbidity:    are also affecting patient's functional outcome. Patient will benefit from skilled PT to address above impairments and improve overall function.  REHAB POTENTIAL: Good  CLINICAL DECISION MAKING: Stable/uncomplicated  EVALUATION COMPLEXITY: Low   GOALS:  SHORT TERM GOALS:  STG Name Target Date Goal status  1 Pt will become independent with HEP in order to demonstrate synthesis of PT education.  10/08/2021 INITIAL  2 Pt will be able to  perform 5XSTS in under 12s  in order to demonstrate functional improvement above the cut off score for adults.  10/22/2021 INITIAL  3 Pt will report at least 2 pt reduction on NPRS scale for pain in order to demonstrate functional improvement with household activity, self care, and ADL.  10/22/2021 INITIAL  4 Pt will score at least 5 pt increase on FOTO to demonstrate functional improvement in MCII and pt perceived function.   10/22/2021 INITIAL   LONG TERM GOALS:   LTG Name Target Date Goal status  1 Pt  will become independent with final HEP in order to demonstrate synthesis of PT education.  11/19/2021 INITIAL  2 Pt will be able to demonstrate kneeling to stand, and stand to kneeling transfer with UE support without pain in order to demonstrate functional improvement in LBP for occupational and household duties.  11/19/2021 INITIAL  3 Pt will be able to lift/squat/hold >25 lbs in order to demonstrate functional improvement in lumbopelvic strength for return to PLOF and occupation.   11/19/2021 INITIAL  4 Pt will score >/= 63 on FOTO to demonstrate functional improvement in LBP.  11/19/2021 INITIAL   PLAN: PT FREQUENCY: 1-2x/week  PT DURATION: 12 weeks (likely to D/C by 8)  PLANNED INTERVENTIONS: Therapeutic exercises, Therapeutic activity, Neuro Muscular re-education, Balance training, Gait training, Patient/Family education, Joint mobilization, Aquatic Therapy, Dry Needling, Electrical stimulation, Spinal mobilization, Cryotherapy, Moist heat, scar mobilization, Taping, Vasopneumatic device, Traction, Ultrasound, Ionotophoresis 4mg /ml Dexamethasone, and Manual therapy  PLAN FOR NEXT SESSION: review HEP, UPA joint mobilizations, re-try LTR, STM to R, lifting mechanics, re-check SIJ  Daleen Bo PT, DPT 09/24/21 4:34 PM

## 2021-10-02 ENCOUNTER — Ambulatory Visit: Payer: Self-pay | Admitting: Pharmacist

## 2021-10-07 ENCOUNTER — Other Ambulatory Visit: Payer: Self-pay | Admitting: Family Medicine

## 2021-10-07 DIAGNOSIS — G8929 Other chronic pain: Secondary | ICD-10-CM

## 2021-10-07 NOTE — Telephone Encounter (Signed)
Requested medications are due for refill today yes  Requested medications are on the active medication list yes  Last refill 09/10/21  Last visit 08/14/21  Future visit scheduled 12/23/20  Notes to clinic Failed protocol of labs (HGB) within 360 days, has upcoming appt, please assess.  Requested Prescriptions  Pending Prescriptions Disp Refills   meloxicam (MOBIC) 15 MG tablet [Pharmacy Med Name: MELOXICAM 15MG  TABLETS] 30 tablet 0    Sig: TAKE 1 TABLET(15 MG) BY MOUTH DAILY AS NEEDED FOR PAIN     Analgesics:  COX2 Inhibitors Failed - 10/07/2021  3:42 AM      Failed - HGB in normal range and within 360 days    Hemoglobin  Date Value Ref Range Status  07/19/2020 12.3 12.0 - 15.0 g/dL Final          Failed - Cr in normal range and within 360 days    Creat  Date Value Ref Range Status  05/22/2020 0.64 0.50 - 1.10 mg/dL Final   Creatinine, Ser  Date Value Ref Range Status  07/19/2020 0.72 0.44 - 1.00 mg/dL Final          Passed - Patient is not pregnant      Passed - Valid encounter within last 12 months    Recent Outpatient Visits           1 month ago Multiple lipomas   [facility], Dionne Bucy, [place]   1 year ago Hospital discharge follow-up   [facility], Amy J, NP       Future Appointments             In 2 months Gildardo Pounds, NP [place]

## 2021-10-17 ENCOUNTER — Encounter (HOSPITAL_BASED_OUTPATIENT_CLINIC_OR_DEPARTMENT_OTHER): Payer: Self-pay | Admitting: Physical Therapy

## 2021-10-22 ENCOUNTER — Other Ambulatory Visit: Payer: Self-pay

## 2021-10-22 ENCOUNTER — Ambulatory Visit (HOSPITAL_BASED_OUTPATIENT_CLINIC_OR_DEPARTMENT_OTHER): Payer: Self-pay | Attending: Orthopaedic Surgery | Admitting: Physical Therapy

## 2021-10-22 ENCOUNTER — Encounter (HOSPITAL_BASED_OUTPATIENT_CLINIC_OR_DEPARTMENT_OTHER): Payer: Self-pay | Admitting: Physical Therapy

## 2021-10-22 DIAGNOSIS — M545 Low back pain, unspecified: Secondary | ICD-10-CM | POA: Insufficient documentation

## 2021-10-22 DIAGNOSIS — R262 Difficulty in walking, not elsewhere classified: Secondary | ICD-10-CM | POA: Insufficient documentation

## 2021-10-22 DIAGNOSIS — M6281 Muscle weakness (generalized): Secondary | ICD-10-CM | POA: Insufficient documentation

## 2021-10-22 NOTE — Therapy (Signed)
OUTPATIENT PHYSICAL THERAPY TREATMENT NOTE   Patient Name: Kathleen Savage MRN: [id number] DOB:06-27-1976, 45 y.o., female Today's Date: 10/22/2021   PT End of Session - 10/22/21 0825     Visit Number 2    Number of Visits 17    Date for PT Re-Evaluation 12/23/21    Authorization Type General Commercial    PT Start Time 0802    PT Stop Time 0840    PT Time Calculation (min) 38 min    Activity Tolerance Patient tolerated treatment well    Behavior During Therapy WFL for tasks assessed/performed              Past Medical History:  Diagnosis Date   Glucose intolerance (pre-diabetes)    HIV infection ([place])    Hyperlipidemia    History reviewed. No pertinent surgical history. 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   Patellofemoral pain syndrome 08/01/2020   Bell's palsy 02/09/2018   Elevated liver enzymes 11/29/2013   Dyslipidemia 01/25/2013   Hyperglycemia 01/25/2013   Multiple lipomas 01/25/2013   Morbid obesity ([place]) 05/07/2010   LOW BACK PAIN, CHRONIC 11/28/2008   ALOPECIA 08/03/2008   HERPES ZOSTER 12/01/2006   PAP SMEAR, ABNORMAL 12/01/2006   Human immunodeficiency virus (HIV) disease ([place]) 09/14/2006    PCP: Argentina Donovan, PA-C  REFERRING PROVIDER: Argentina Donovan, PA-C  REFERRING DIAG: [phone] (ICD-10-CM) - Chronic low back pain with right-sided sciatica, unspecified back pain laterality   THERAPY DIAG:  Pain, lumbar region  Difficulty walking  Muscle weakness (generalized)  ONSET DATE: 06/2021  SUBJECTIVE:  Pt present today without interpreter. Pt states she does not need it today "if you do not use big words."                                                                                                                                                                                         SUBJECTIVE STATEMENT:  Pt states that the back pain comes and goes. The exercises help but  she continues to have pain due to working.   PERTINENT HISTORY:  obesity, HIV, HLD  PAIN:  Are you having pain? Yes VAS scale: 3/10 Pain location: L LE Pain orientation: Bilateral LE, ant an post thigh PAIN TYPE: aching Pain description: dull  Aggravating factors: standing, walking, transfers after sitting for too long, squatting, bending Relieving factors: Tylenol, muscle relaxers, resting. 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    OBJECTIVE:   PATIENT SURVEYS:  FOTO 67 D/C 21 MCII 5pts   TODAY'S TREATMENT  Bilat L/S UPA grade III CPA L/S grade III STM bilat hip rotators  Exercises Supine Posterior Pelvic Tilt (1/4 bridging motion)- 2s 15x Seated Quadratus Lumborum Stretch in Chair  30s 2x Standing Lumbar Spine Flexion Stretch Counter 15x Standing Lumbar Extension 15x LTR 3s 10x each R prone quad stretch with strap 30s 3x Lifting mechanics- ab bracing, stability, pivoting vs twisting    PATIENT EDUCATION:  Education details: MOI, diagnosis, prognosis, anatomy, exercise progression, DOMS expectations, muscle firing,  envelope of function, HEP, POC  Person educated: Patient Education method: Explanation, Demonstration, Tactile cues, Verbal cues, and Handouts Education comprehension: verbalized understanding, returned demonstration, verbal cues required, tactile cues required, and needs further education   HOME EXERCISE PROGRAM: Access Code: [email or web address] URL: [email or web address] Date: 09/24/2021 Prepared by: Daleen Bo  ASSESSMENT:  CLINICAL IMPRESSION: Pt with good tolerance to exercise today and manual therapy today. However, limited by R anterior thigh pain. Pt able to increase mobility exercise and introduce bridging today without pain after R sided  quad stretching. Pt given new mobility exercise for HEP and reviewed lifting mechanics for work place safety while lifting. Plan to trial adding segmental flexion at next session.   Objective impairments include Abnormal gait, decreased activity tolerance, decreased balance, decreased endurance, decreased mobility, difficulty walking, decreased ROM, decreased strength, hypomobility, increased muscle spasms, impaired flexibility, improper body mechanics, postural dysfunction, obesity, and pain. These impairments are limiting patient from cleaning, occupation, laundry, yard work, and shopping. Personal factors including Age, Behavior pattern, Fitness, Profession, Time since onset of injury/illness/exacerbation, and 1 comorbidity:    are also affecting patient's functional outcome. Patient will benefit from skilled PT to address above impairments and improve overall function.  REHAB POTENTIAL: Good  CLINICAL DECISION MAKING: Stable/uncomplicated  EVALUATION COMPLEXITY: Low   GOALS:  SHORT TERM GOALS:  STG Name Target Date Goal status  1 Pt will become independent with HEP in order to demonstrate synthesis of PT education.  11/05/2021 INITIAL  2 Pt will be able to perform 5XSTS in under 12s  in order to demonstrate functional improvement above the cut off score for adults.  11/19/2021 INITIAL  3 Pt will report at least 2 pt reduction on NPRS scale for pain in order to demonstrate functional improvement with household activity, self care, and ADL.  11/19/2021 INITIAL  4 Pt will score at least 5 pt increase on FOTO to demonstrate functional improvement in MCII and pt perceived function.   11/19/2021 INITIAL   LONG TERM GOALS:   LTG Name Target Date Goal status  1 Pt  will become independent with final HEP in order to demonstrate synthesis of PT education.  12/17/2021 INITIAL  2 Pt will be able to demonstrate kneeling to stand, and stand to kneeling transfer with UE support without pain in order to  demonstrate functional improvement in LBP for occupational and household duties.  12/17/2021 INITIAL  3 Pt will be able to lift/squat/hold >25 lbs in order to demonstrate functional improvement in lumbopelvic strength for return to PLOF and occupation.   12/17/2021 INITIAL  4 Pt will score >/= 63 on FOTO to demonstrate functional improvement in LBP.  12/17/2021 INITIAL   PLAN: PT FREQUENCY: 1-2x/week  PT DURATION: 12 weeks (likely to D/C by 8)  PLANNED INTERVENTIONS: Therapeutic exercises, Therapeutic activity, Neuro Muscular re-education, Balance training, Gait training, Patient/Family education, Joint mobilization,  Aquatic Therapy, Dry Needling, Electrical stimulation, Spinal mobilization, Cryotherapy, Moist heat, scar mobilization, Taping, Vasopneumatic device, Traction, Ultrasound, Ionotophoresis 4mg /ml Dexamethasone, and Manual therapy  PLAN FOR NEXT SESSION: review HEP, UPA joint mobilizations, re-try LTR, STM to R, KB RDL Daleen Bo PT, DPT 10/22/21 8:50 AM

## 2021-10-29 ENCOUNTER — Encounter (HOSPITAL_BASED_OUTPATIENT_CLINIC_OR_DEPARTMENT_OTHER): Payer: Self-pay | Admitting: Physical Therapy

## 2021-11-05 ENCOUNTER — Ambulatory Visit (HOSPITAL_BASED_OUTPATIENT_CLINIC_OR_DEPARTMENT_OTHER): Payer: Self-pay | Admitting: Physical Therapy

## 2021-11-05 ENCOUNTER — Other Ambulatory Visit: Payer: Self-pay

## 2021-11-05 ENCOUNTER — Encounter (HOSPITAL_BASED_OUTPATIENT_CLINIC_OR_DEPARTMENT_OTHER): Payer: Self-pay | Admitting: Physical Therapy

## 2021-11-05 DIAGNOSIS — M6281 Muscle weakness (generalized): Secondary | ICD-10-CM

## 2021-11-05 DIAGNOSIS — R262 Difficulty in walking, not elsewhere classified: Secondary | ICD-10-CM

## 2021-11-05 DIAGNOSIS — M545 Low back pain, unspecified: Secondary | ICD-10-CM

## 2021-11-05 NOTE — Therapy (Signed)
OUTPATIENT PHYSICAL THERAPY TREATMENT NOTE   Patient Name: Kathleen Savage MRN: [id number] DOB:09-23-1976, 45 y.o., female Today's Date: 11/05/2021   PT End of Session - 11/05/21 1140     Visit Number 3    Number of Visits 17    Date for PT Re-Evaluation 12/23/21    Authorization Type General Commercial    PT Start Time 1100    PT Stop Time 1140    PT Time Calculation (min) 40 min    Activity Tolerance Patient tolerated treatment well    Behavior During Therapy WFL for tasks assessed/performed               Past Medical History:  Diagnosis Date   Glucose intolerance (pre-diabetes)    HIV infection ([place])    Hyperlipidemia    History reviewed. No pertinent surgical history. 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   Patellofemoral pain syndrome 08/01/2020   Bell's palsy 02/09/2018   Elevated liver enzymes 11/29/2013   Dyslipidemia 01/25/2013   Hyperglycemia 01/25/2013   Multiple lipomas 01/25/2013   Morbid obesity ([place]) 05/07/2010   LOW BACK PAIN, CHRONIC 11/28/2008   ALOPECIA 08/03/2008   HERPES ZOSTER 12/01/2006   PAP SMEAR, ABNORMAL 12/01/2006   Human immunodeficiency virus (HIV) disease ([place]) 09/14/2006    PCP: Argentina Donovan, PA-C  REFERRING PROVIDER: Argentina Donovan, PA-C  REFERRING DIAG: [phone] (ICD-10-CM) - Chronic low back pain with right-sided sciatica, unspecified back pain laterality   THERAPY DIAG:  Pain, lumbar region  Difficulty walking  Muscle weakness (generalized)  ONSET DATE: 06/2021  SUBJECTIVE:  Pt present without interpreter.                                                                                                                        SUBJECTIVE STATEMENT:  Pt states the pain is a little better than last week. Sunday, slipped on black ice but she does not think she hurt anything when she landed. Her R leg went out like the splits.   PERTINENT HISTORY:   obesity, HIV, HLD  PAIN:  Are you having pain? 0 VAS scale: 0/10 Pain location: L LE Pain orientation: Bilateral LE, ant an post thigh PAIN TYPE: aching Pain description: dull  Aggravating factors: standing, walking, transfers after sitting for too long, squatting, bending Relieving factors: Tylenol, muscle relaxers, resting. 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    OBJECTIVE:   PATIENT SURVEYS:  FOTO 47 D/C 63 MCII 5pts   TODAY'S TREATMENT    Exercises  Bridging 3x10 Supine Posterior Pelvic Tilt (1/4 bridging motion)- 2s 15x Seated Quadratus Lumborum Stretch in Chair  30s 2x Standing lumbar flexion 2x5 Dynamic HS stretch L10s 10x Standing hip flexor stretch 30s 3x R  Standing Lumbar Extension 15x bilateral prone quad stretch with strap 30s 3x Standing paloff press GTB 2x10     PATIENT EDUCATION:  Education details: MOI, diagnosis, prognosis, anatomy, exercise progression, DOMS expectations, muscle firing,  envelope of function, HEP, POC  Person educated: Patient Education method: Explanation, Demonstration, Tactile cues, Verbal cues, and Handouts Education comprehension: verbalized understanding, returned demonstration, verbal cues required, tactile cues required, and needs further education   HOME EXERCISE PROGRAM: Access Code: [email or web address] URL: [email or web address] Date: 09/24/2021 Prepared by: Daleen Bo  ASSESSMENT:  CLINICAL IMPRESSION: Pt with improved tolerance to R LE stretching today as well as being able to perform full ROM bridging without pain. Pt with significant R ant hip stiffness that is likely affecting back pain and R sided LE pain. Focus today more R LE mobility/flexibility as well as introducing lumbar strengthening exercise. Plan  to continue with adding in more strength based exercise for home if no increase in pain. Plan to decrease frequency due to financial considerations.   Objective impairments include Abnormal gait, decreased activity tolerance, decreased balance, decreased endurance, decreased mobility, difficulty walking, decreased ROM, decreased strength, hypomobility, increased muscle spasms, impaired flexibility, improper body mechanics, postural dysfunction, obesity, and pain. These impairments are limiting patient from cleaning, occupation, laundry, yard work, and shopping. Personal factors including Age, Behavior pattern, Fitness, Profession, Time since onset of injury/illness/exacerbation, and 1 comorbidity:    are also affecting patient's functional outcome. Patient will benefit from skilled PT to address above impairments and improve overall function.  REHAB POTENTIAL: Good  CLINICAL DECISION MAKING: Stable/uncomplicated  EVALUATION COMPLEXITY: Low   GOALS:  SHORT TERM GOALS:  STG Name Target Date Goal status  1 Pt will become independent with HEP in order to demonstrate synthesis of PT education.  11/19/2021 INITIAL  2 Pt will be able to perform 5XSTS in under 12s  in order to demonstrate functional improvement above the cut off score for adults.  12/03/2021 INITIAL  3 Pt will report at least 2 pt reduction on NPRS scale for pain in order to demonstrate functional improvement with household activity, self care, and ADL.  12/03/2021 INITIAL  4 Pt will score at least 5 pt increase on FOTO to demonstrate functional improvement in MCII and pt perceived function.   12/03/2021 INITIAL   LONG TERM GOALS:   LTG Name Target Date Goal status  1 Pt  will become independent with final HEP in order to demonstrate synthesis of PT education.  12/31/2021 INITIAL  2 Pt will be able to demonstrate kneeling to stand, and stand to kneeling transfer with UE support without pain in order to demonstrate functional  improvement in LBP for occupational and household duties.  12/31/2021 INITIAL  3 Pt will be able to lift/squat/hold >25 lbs in order to demonstrate functional improvement in lumbopelvic strength for return to PLOF and occupation.   12/31/2021 INITIAL  4 Pt will score >/= 63 on FOTO to demonstrate functional improvement in LBP.  12/31/2021 INITIAL   PLAN: PT FREQUENCY: 1-2x/week  PT DURATION: 12 weeks (likely to D/C by 8)  PLANNED INTERVENTIONS: Therapeutic exercises, Therapeutic activity, Neuro Muscular re-education, Balance training, Gait training, Patient/Family education, Joint mobilization, Aquatic Therapy, Dry Needling, Electrical stimulation, Spinal mobilization, Cryotherapy, Moist heat, scar mobilization, Taping, Vasopneumatic device, Traction, Ultrasound, Ionotophoresis 4mg /ml Dexamethasone, and Manual therapy  PLAN FOR NEXT SESSION: review HEP, add KB DL or paloff to HEP, continue with R LE and hip mobility/flexibility  Daleen Bo PT, DPT 11/05/21 11:47 AM

## 2021-11-26 ENCOUNTER — Other Ambulatory Visit: Payer: Self-pay

## 2021-11-26 ENCOUNTER — Ambulatory Visit: Payer: Self-pay

## 2021-11-26 ENCOUNTER — Encounter: Payer: Self-pay | Admitting: Internal Medicine

## 2021-12-03 ENCOUNTER — Encounter (HOSPITAL_BASED_OUTPATIENT_CLINIC_OR_DEPARTMENT_OTHER): Payer: Self-pay | Admitting: Physical Therapy

## 2021-12-05 ENCOUNTER — Other Ambulatory Visit: Payer: Self-pay | Admitting: Family Medicine

## 2021-12-05 DIAGNOSIS — G8929 Other chronic pain: Secondary | ICD-10-CM

## 2021-12-05 DIAGNOSIS — M5441 Lumbago with sciatica, right side: Secondary | ICD-10-CM

## 2021-12-05 NOTE — Telephone Encounter (Signed)
Requested Prescriptions  Pending Prescriptions Disp Refills   meloxicam (MOBIC) 15 MG tablet [Pharmacy Med Name: MELOXICAM 15MG  TABLETS] 30 tablet 0    Sig: TAKE 1 TABLET(15 MG) BY MOUTH DAILY AS NEEDED FOR PAIN     Analgesics:  COX2 Inhibitors Failed - 12/05/2021  3:40 AM      Failed - HGB in normal range and within 360 days    Hemoglobin  Date Value Ref Range Status  07/19/2020 12.3 12.0 - 15.0 g/dL Final         Failed - Cr in normal range and within 360 days    Creat  Date Value Ref Range Status  05/22/2020 0.64 0.50 - 1.10 mg/dL Final   Creatinine, Ser  Date Value Ref Range Status  07/19/2020 0.72 0.44 - 1.00 mg/dL Final         Passed - Patient is not pregnant      Passed - Valid encounter within last 12 months    Recent Outpatient Visits          3 months ago Multiple lipomas   [place], Dionne Bucy, [place]   1 year ago Hospital discharge follow-up   [place], Amy J, NP      Future Appointments            In 2 weeks Gildardo Pounds, NP [place]

## 2021-12-23 ENCOUNTER — Encounter: Payer: Self-pay | Admitting: Nurse Practitioner

## 2021-12-23 ENCOUNTER — Other Ambulatory Visit: Payer: Self-pay

## 2021-12-23 ENCOUNTER — Ambulatory Visit: Payer: Self-pay | Attending: Nurse Practitioner | Admitting: Nurse Practitioner

## 2021-12-23 VITALS — BP 116/79 | HR 79 | Ht 62.0 in | Wt 259.0 lb

## 2021-12-23 DIAGNOSIS — M5441 Lumbago with sciatica, right side: Secondary | ICD-10-CM

## 2021-12-23 DIAGNOSIS — B20 Human immunodeficiency virus [HIV] disease: Secondary | ICD-10-CM

## 2021-12-23 DIAGNOSIS — Z130 Encounter for screening for diseases of the blood and blood-forming organs and certain disorders involving the immune mechanism: Secondary | ICD-10-CM

## 2021-12-23 DIAGNOSIS — G8929 Other chronic pain: Secondary | ICD-10-CM

## 2021-12-23 DIAGNOSIS — Z23 Encounter for immunization: Secondary | ICD-10-CM

## 2021-12-23 DIAGNOSIS — M159 Polyosteoarthritis, unspecified: Secondary | ICD-10-CM

## 2021-12-23 DIAGNOSIS — Z7689 Persons encountering health services in other specified circumstances: Secondary | ICD-10-CM

## 2021-12-23 DIAGNOSIS — R7309 Other abnormal glucose: Secondary | ICD-10-CM

## 2021-12-23 DIAGNOSIS — E785 Hyperlipidemia, unspecified: Secondary | ICD-10-CM

## 2021-12-23 DIAGNOSIS — Z1231 Encounter for screening mammogram for malignant neoplasm of breast: Secondary | ICD-10-CM

## 2021-12-23 DIAGNOSIS — R748 Abnormal levels of other serum enzymes: Secondary | ICD-10-CM

## 2021-12-23 MED ORDER — ACETAMINOPHEN-CODEINE #3 300-30 MG PO TABS
1.0000 | ORAL_TABLET | ORAL | 0 refills | Status: DC | PRN
  Filled 2021-12-23: qty 30, 3d supply, fill #0

## 2021-12-23 NOTE — Progress Notes (Signed)
Assessment & Plan:  Boots was seen today for establish care.  Diagnoses and all orders for this visit:  Encounter to establish care  Chronic bilateral low back pain with right-sided sciatica -     acetaminophen-codeine (TYLENOL #3) 300-30 MG tablet; Take 1-2 tablets by mouth every 4 (four) hours as needed for moderate pain. Work on losing weight to help reduce back pain. May alternate with heat and ice application for pain relief. May also alternate with Ibuprofen as prescribed for back pain. Other alternatives include massage, acupuncture and water aerobics.  You must stay active and avoid a sedentary lifestyle.    Need for influenza vaccination -     Flu Vaccine QUAD 73moIM (Fluarix, Fluzone & Alfiuria Quad PF)  Human immunodeficiency virus (HIV) disease ([facility]) -     RPR -     [facility]CD[facility] -     HIV 1 RNA quant-no reflex-bld  Dyslipidemia -     Lipid panel  Elevated liver enzymes -     CMP14+EGFR  Elevated glucose -     Hemoglobin A1c  Screening for deficiency anemia -     CBC  Breast cancer screening by mammogram -     MS DIGITAL SCREENING TOMO BILATERAL; Future    Patient has been counseled on age-appropriate routine health concerns for screening and prevention. These are reviewed and up-to-date. Referrals have been placed accordingly. Immunizations are up-to-date or declined.    Subjective:   Chief Complaint  Patient presents with   Kathleen Savage 46y.o. female presents to office today to establish care.  She has a past medical history of Glucose intolerance (pre-diabetes), Low back pain, Morbid obesity,  HIV infection (Followed by ID), and Hyperlipidemia.    Back Pain She has chronic low back pain with right sided sciatica consistent with lumbar stenosis. Pain ongoing for several months. She has been evaluated by Ortho and treated with  steroid taper and PT was recommended. She was to return last November  for follow up however she has not returned at this time. Pain in back is described as aching and stiffness with burning in the right hip and leg She is no longer working with physical therapy    Review of Systems  Constitutional:  Negative for fever, malaise/fatigue and weight loss.  HENT: Negative.  Negative for nosebleeds.   Eyes: Negative.  Negative for blurred vision, double vision and photophobia.  Respiratory: Negative.  Negative for cough and shortness of breath.   Cardiovascular: Negative.  Negative for chest pain, palpitations and leg swelling.  Gastrointestinal: Negative.  Negative for heartburn, nausea and vomiting.  Musculoskeletal:  Positive for back pain and myalgias.  Neurological: Negative.  Negative for dizziness, focal weakness, seizures and headaches.  Psychiatric/Behavioral: Negative.  Negative for suicidal ideas.    Past Medical History:  Diagnosis Date   Glucose intolerance (pre-diabetes)    HIV infection ([facility])    Hyperlipidemia     History reviewed. No pertinent surgical history.  Family History  Problem Relation Age of Onset   Cancer Maternal Aunt        Breast Cancer   Breast cancer Maternal Aunt    Cancer Cousin        Breast cancer - maternal cousin   Breast cancer Cousin    Cancer Cousin        breast cancer   Breast cancer Cousin    Cancer Cousin  breast cancer   Cancer Maternal Aunt        stomach cancer   Breast cancer Maternal Aunt    Hypertension Mother    Stroke Father    Cancer Sister    Uterine cancer Sister     Social History Reviewed with no changes to be made today.   Outpatient Medications Prior to Visit  Medication Sig Dispense Refill   bictegravir-emtricitabine-tenofovir AF (BIKTARVY) 50-200-25 MG TABS tablet Take 1 tablet by mouth daily. 30 tablet 11   calcium carbonate (TUMS - DOSED IN MG ELEMENTAL CALCIUM) 500 MG chewable tablet Chew 1 tablet by mouth daily.     cyclobenzaprine (FLEXERIL) 10 MG tablet Take 1  tablet (10 mg total) by mouth at bedtime. Prn leg/back pain 30 tablet 1   diclofenac Sodium (VOLTAREN) 1 % GEL Apply 4 g topically 4 (four) times daily. To left knee. 150 g 1   meloxicam (MOBIC) 15 MG tablet TAKE 1 TABLET(15 MG) BY MOUTH DAILY AS NEEDED FOR PAIN 30 tablet 0   doxycycline (VIBRAMYCIN) 100 MG capsule Take 1 capsule (100 mg total) by mouth 2 (two) times daily. (Patient not taking: Reported on 06/25/2021) 20 capsule 0   methylPREDNISolone (MEDROL DOSEPAK) 4 MG TBPK tablet Take per packet instructions (Patient not taking: Reported on 12/23/2021) 21 tablet 0   No facility-administered medications prior to visit.    No Known Allergies     Objective:    BP 116/79    Pulse 79    Ht '5\' 2"'  (1.575 m)    Wt 259 lb (117.5 kg)    LMP  (LMP Unknown)    SpO2 99%    BMI 47.37 kg/m  Wt Readings from Last 3 Encounters:  12/23/21 259 lb (117.5 kg)  08/27/21 260 lb (117.9 kg)  08/14/21 261 lb 12.8 oz (118.8 kg)    Physical Exam Vitals and nursing note reviewed.  Constitutional:      Appearance: She is well-developed. She is obese.  HENT:     Head: Normocephalic and atraumatic.  Cardiovascular:     Rate and Rhythm: Normal rate and regular rhythm.     Heart sounds: Normal heart sounds. No murmur heard.   No friction rub. No gallop.  Pulmonary:     Effort: Pulmonary effort is normal. No tachypnea or respiratory distress.     Breath sounds: Normal breath sounds. No decreased breath sounds, wheezing, rhonchi or rales.  Chest:     Chest wall: No tenderness.  Abdominal:     General: Bowel sounds are normal.     Palpations: Abdomen is soft.  Musculoskeletal:        General: Normal range of motion.     Cervical back: Normal range of motion.  Skin:    General: Skin is warm and dry.  Neurological:     Mental Status: She is alert and oriented to person, place, and time.     Coordination: Coordination normal.  Psychiatric:        Behavior: Behavior normal. Behavior is cooperative.         Thought Content: Thought content normal.        Judgment: Judgment normal.         Patient has been counseled extensively about nutrition and exercise as well as the importance of adherence with medications and regular follow-up. The patient was given clear instructions to go to ER or return to medical center if symptoms don't improve, worsen or new problems develop. The patient verbalized  understanding.   Follow-up: Return for Physical ONLY no labs.   Gildardo Pounds, FNP-BC [facility] [place], [place]   12/25/2021, 10:20 PM

## 2021-12-25 ENCOUNTER — Encounter: Payer: Self-pay | Admitting: Nurse Practitioner

## 2021-12-25 LAB — CMP14+EGFR
ALT: 28 IU/L (ref 0–32)
AST: 17 IU/L (ref 0–40)
Albumin/Globulin Ratio: 1.5 (ref 1.2–2.2)
Albumin: 4.3 g/dL (ref 3.8–4.8)
Alkaline Phosphatase: 54 IU/L (ref 44–121)
BUN/Creatinine Ratio: 20 (ref 9–23)
BUN: 13 mg/dL (ref 6–24)
Bilirubin Total: <0.2 mg/dL (ref 0.0–1.2)
CO2: 21 mmol/L (ref 20–29)
Calcium: 8.6 mg/dL — ABNORMAL LOW (ref 8.7–10.2)
Chloride: 106 mmol/L (ref 96–106)
Creatinine, Ser: 0.66 mg/dL (ref 0.57–1.00)
Globulin, Total: 2.8 g/dL (ref 1.5–4.5)
Glucose: 103 mg/dL — ABNORMAL HIGH (ref 70–99)
Potassium: 3.8 mmol/L (ref 3.5–5.2)
Sodium: 141 mmol/L (ref 134–144)
Total Protein: 7.1 g/dL (ref 6.0–8.5)
eGFR: 111 mL/min/{1.73_m2} (ref >59)

## 2021-12-25 LAB — HIV-1 RNA QUANT-NO REFLEX-BLD: HIV-1 RNA Viral Load: <20 copies/mL

## 2021-12-25 LAB — CBC
Hematocrit: 36.7 % (ref 34.0–46.6)
Hemoglobin: 12.9 g/dL (ref 11.1–15.9)
MCH: 30.9 pg (ref 26.6–33.0)
MCHC: 35.1 g/dL (ref 31.5–35.7)
MCV: 88 fL (ref 79–97)
Platelets: 345 10*3/uL (ref 150–450)
RBC: 4.18 x10E6/uL (ref 3.77–5.28)
RDW: 12.7 % (ref 11.7–15.4)
WBC: 8 10*3/uL (ref 3.4–10.8)

## 2021-12-25 LAB — LIPID PANEL
Chol/HDL Ratio: 3.4 ratio (ref 0.0–4.4)
Cholesterol, Total: 186 mg/dL (ref 100–199)
HDL: 54 mg/dL (ref >39)
LDL Chol Calc (NIH): 97 mg/dL (ref 0–99)
Triglycerides: 208 mg/dL — ABNORMAL HIGH (ref 0–149)
VLDL Cholesterol Cal: 35 mg/dL (ref 5–40)

## 2021-12-25 LAB — HEMOGLOBIN A1C
Est. average glucose Bld gHb Est-mCnc: 123 mg/dL
Hgb A1c MFr Bld: 5.9 % — ABNORMAL HIGH (ref 4.8–5.6)

## 2021-12-25 LAB — RPR: RPR Ser Ql: NONREACTIVE

## 2021-12-26 ENCOUNTER — Telehealth: Payer: Self-pay

## 2021-12-26 NOTE — Telephone Encounter (Signed)
Called patient reviewed all information and repeated back to me. Will call if any questions.  ? ?

## 2022-01-01 ENCOUNTER — Other Ambulatory Visit: Payer: Self-pay | Admitting: Physician Assistant

## 2022-01-01 DIAGNOSIS — G8929 Other chronic pain: Secondary | ICD-10-CM

## 2022-01-01 NOTE — Telephone Encounter (Signed)
Requested medication (s) are due for refill today:   No Discontinued 12/23/2021 by Geryl Rankins  Requested medication (s) are on the active medication list:   No  Future visit scheduled:   Yes in 1 mo.   Last ordered: Discontinued 12/23/2021  Returned because a request came in for this which has been discontinued.  Requested Prescriptions  Pending Prescriptions Disp Refills   meloxicam (MOBIC) 15 MG tablet [Pharmacy Med Name: MELOXICAM 15MG TABLETS] 30 tablet 0    Sig: TAKE 1 TABLET(15 MG) BY MOUTH DAILY AS NEEDED FOR PAIN     Analgesics:  COX2 Inhibitors Failed - 01/01/2022  3:38 AM      Failed - Manual Review: Labs are only required if the patient has taken medication for more than 8 weeks.      Passed - HGB in normal range and within 360 days    Hemoglobin  Date Value Ref Range Status  12/23/2021 12.9 11.1 - 15.9 g/dL Final          Passed - Cr in normal range and within 360 days    Creat  Date Value Ref Range Status  05/22/2020 0.64 0.50 - 1.10 mg/dL Final   Creatinine, Ser  Date Value Ref Range Status  12/23/2021 0.66 0.57 - 1.00 mg/dL Final          Passed - HCT in normal range and within 360 days    Hematocrit  Date Value Ref Range Status  12/23/2021 36.7 34.0 - 46.6 % Final          Passed - AST in normal range and within 360 days    AST  Date Value Ref Range Status  12/23/2021 17 0 - 40 IU/L Final          Passed - ALT in normal range and within 360 days    ALT  Date Value Ref Range Status  12/23/2021 28 0 - 32 IU/L Final          Passed - eGFR is 30 or above and within 360 days    GFR, Est African American  Date Value Ref Range Status  11/15/2013 >89 mL/min Final   GFR calc Af Amer  Date Value Ref Range Status  07/19/2020 >60 >60 mL/min Final   GFR, Est Non African American  Date Value Ref Range Status  11/15/2013 >89 mL/min Final    Comment:      The estimated GFR is a calculation valid for adults (>=24 years old) that uses the  CKD-EPI algorithm to adjust for age and sex. It is   not to be used for children, pregnant women, hospitalized patients,    patients on dialysis, or with rapidly changing kidney function. According to the NKDEP, eGFR >89 is normal, 60-89 shows mild impairment, 30-59 shows moderate impairment, 15-29 shows severe impairment and <15 is ESRD.     GFR calc non Af Amer  Date Value Ref Range Status  07/19/2020 >60 >60 mL/min Final   eGFR  Date Value Ref Range Status  12/23/2021 111 >59 mL/min/1.73 Final          Passed - Patient is not pregnant      Passed - Valid encounter within last 12 months    Recent Outpatient Visits           1 week ago Encounter to establish care   [place], NP   4 months ago Multiple lipomas   [facility]  [facility], Dionne Bucy, [place]   1 year ago Hospital discharge follow-up   [place], Amy J, NP       Future Appointments             In 1 month Gildardo Pounds, NP [place]

## 2022-02-12 ENCOUNTER — Encounter: Payer: Self-pay | Admitting: Nurse Practitioner

## 2022-02-12 ENCOUNTER — Ambulatory Visit: Payer: Self-pay | Attending: Nurse Practitioner | Admitting: Nurse Practitioner

## 2022-02-12 VITALS — BP 133/82 | HR 74 | Temp 98.7°F | Resp 16 | Wt 256.0 lb

## 2022-02-12 DIAGNOSIS — M5441 Lumbago with sciatica, right side: Secondary | ICD-10-CM

## 2022-02-12 DIAGNOSIS — G8929 Other chronic pain: Secondary | ICD-10-CM

## 2022-02-12 DIAGNOSIS — Z0000 Encounter for general adult medical examination without abnormal findings: Secondary | ICD-10-CM

## 2022-02-12 NOTE — Progress Notes (Signed)
CPE

## 2022-02-12 NOTE — Progress Notes (Signed)
? ?Assessment & Plan:  ?Kathleen Savage was seen today for back pain. ? ?Diagnoses and all orders for this visit: ? ?Encounter for annual physical exam ? ?Chronic bilateral low back pain with right-sided sciatica ?Tylenol 3 with codeine ?Needs to Work on losing weight to help reduce back pain.  ?May alternate with heat and ice application for pain relief. May also alternate with Ibuprofen as prescribed for back pain.  ?  ? ? ?Patient has been counseled on age-appropriate routine health concerns for screening and prevention. These are reviewed and up-to-date. Referrals have been placed accordingly. Immunizations are up-to-date or declined.    ?Subjective:  ? ?Chief Complaint  ?Patient presents with  ? Back Pain  ? ?HPI ?Kathleen Savage 46 y.o. female presents to office today for annual physical exam ? ?Patient has been counseled on age-appropriate routine health concerns for screening and prevention. These are reviewed and up-to-date. Referrals have been placed accordingly. Immunizations are up-to-date or declined.     ?MAMMOGRAM: Referred to [facility] ? ?Chronic Low back pain ?BMI 46. Worse with prolonged standing, sitting and lying down. Pain radiates down both legs. Medications tried in the past include: mobic, flexeril, prednisone. Treatments include: physical therapy. She has seen Orthopedic specialist in the past as well. Denies any bowel or bladder incontinence.  ?  ? ?Review of Systems  ?Constitutional:  Negative for fever, malaise/fatigue and weight loss.  ?HENT: Negative.  Negative for nosebleeds.   ?Eyes: Negative.  Negative for blurred vision, double vision and photophobia.  ?Respiratory: Negative.  Negative for cough and shortness of breath.   ?Cardiovascular: Negative.  Negative for chest pain, palpitations and leg swelling.  ?Gastrointestinal: Negative.  Negative for heartburn, nausea and vomiting.  ?Genitourinary: Negative.   ?Musculoskeletal:  Positive for back pain and myalgias.  ?Skin: Negative.    ?Neurological: Negative.  Negative for dizziness, focal weakness, seizures and headaches.  ?Endo/Heme/Allergies: Negative.   ?Psychiatric/Behavioral: Negative.  Negative for suicidal ideas.   ? ?Past Medical History:  ?Diagnosis Date  ? Glucose intolerance (pre-diabetes)   ? HIV infection ([place])   ? Hyperlipidemia   ? ? ?History reviewed. No pertinent surgical history. ? ?Family History  ?Problem Relation Age of Onset  ? Cancer Maternal Aunt   ?     Breast Cancer  ? Breast cancer Maternal Aunt   ? Cancer Cousin   ?     Breast cancer - maternal cousin  ? Breast cancer Cousin   ? Cancer Cousin   ?     breast cancer  ? Breast cancer Cousin   ? Cancer Cousin   ?     breast cancer  ? Cancer Maternal Aunt   ?     stomach cancer  ? Breast cancer Maternal Aunt   ? Hypertension Mother   ? Stroke Father   ? Cancer Sister   ? Uterine cancer Sister   ? ? ?Social History Reviewed with no changes to be made today.  ? ?Outpatient Medications Prior to Visit  ?Medication Sig Dispense Refill  ? acetaminophen-codeine (TYLENOL #3) 300-30 MG tablet Take 1-2 tablets by mouth every 4 (four) hours as needed for moderate pain. 30 tablet 0  ? bictegravir-emtricitabine-tenofovir AF (BIKTARVY) 50-200-25 MG TABS tablet Take 1 tablet by mouth daily. 30 tablet 11  ? calcium carbonate (TUMS - DOSED IN MG ELEMENTAL CALCIUM) 500 MG chewable tablet Chew 1 tablet by mouth daily.    ? cyclobenzaprine (FLEXERIL) 10 MG tablet Take 1 tablet (10 mg total) by  mouth at bedtime. Prn leg/back pain 30 tablet 1  ? diclofenac Sodium (VOLTAREN) 1 % GEL Apply 4 g topically 4 (four) times daily. To left knee. 150 g 1  ? ?No facility-administered medications prior to visit.  ? ? ?No Known Allergies ? ?   ?Objective:  ?  ?BP 133/82 (BP Location: Left Arm, Patient Position: Sitting, Cuff Size: Large)   Pulse 74   Temp 98.7 ?F (37.1 ?C) (Oral)   Resp 16   Wt 256 lb (116.1 kg)   LMP 01/30/2022   SpO2 99%   BMI 46.82 kg/m?  ?Wt Readings from Last 3 Encounters:   ?02/12/22 256 lb (116.1 kg)  ?12/23/21 259 lb (117.5 kg)  ?08/27/21 260 lb (117.9 kg)  ? ? ?Physical Exam ?Constitutional:   ?   Appearance: She is well-developed.  ?HENT:  ?   Head: Normocephalic and atraumatic.  ?   Right Ear: Hearing, tympanic membrane, ear canal and external ear normal.  ?   Left Ear: Hearing, tympanic membrane, ear canal and external ear normal.  ?   Nose: Nose normal.  ?   Right Turbinates: Not enlarged.  ?   Left Turbinates: Not enlarged.  ?   Mouth/Throat:  ?   Lips: Pink.  ?   Mouth: Mucous membranes are moist.  ?   Dentition: No dental tenderness, gingival swelling, dental abscesses or gum lesions.  ?   Pharynx: No oropharyngeal exudate.  ?Eyes:  ?   General: No scleral icterus.    ?   Right eye: No discharge.  ?   Extraocular Movements: Extraocular movements intact.  ?   Conjunctiva/sclera: Conjunctivae normal.  ?   Pupils: Pupils are equal, round, and reactive to light.  ?Neck:  ?   Thyroid: No thyromegaly.  ?   Trachea: No tracheal deviation.  ?Cardiovascular:  ?   Rate and Rhythm: Normal rate and regular rhythm.  ?   Heart sounds: Normal heart sounds. No murmur heard. ?  No friction rub.  ?Pulmonary:  ?   Effort: Pulmonary effort is normal. No accessory muscle usage or respiratory distress.  ?   Breath sounds: Normal breath sounds. No decreased breath sounds, wheezing, rhonchi or rales.  ?Abdominal:  ?   General: Bowel sounds are normal. There is no distension.  ?   Palpations: Abdomen is soft. There is no mass.  ?   Tenderness: There is no abdominal tenderness. There is no right CVA tenderness, left CVA tenderness, guarding or rebound.  ?   Hernia: No hernia is present.  ?Musculoskeletal:     ?   General: No tenderness or deformity. Normal range of motion.  ?   Cervical back: Normal range of motion and neck supple.  ?Lymphadenopathy:  ?   Cervical: No cervical adenopathy.  ?Skin: ?   General: Skin is warm and dry.  ?   Findings: No erythema.  ?Neurological:  ?   Mental Status: She is  alert and oriented to person, place, and time.  ?   Cranial Nerves: No cranial nerve deficit.  ?   Motor: Motor function is intact.  ?   Coordination: Coordination is intact. Coordination normal.  ?   Gait: Gait is intact.  ?   Deep Tendon Reflexes:  ?   Reflex Scores: ?     Patellar reflexes are 1+ on the right side and 1+ on the left side. ?Psychiatric:     ?   Attention and Perception: Attention normal.     ?  Mood and Affect: Mood normal.     ?   Speech: Speech normal.     ?   Behavior: Behavior normal.     ?   Thought Content: Thought content normal.     ?   Judgment: Judgment normal.  ? ? ? ? ?   ?Patient has been counseled extensively about nutrition and exercise as well as the importance of adherence with medications and regular follow-up. The patient was given clear instructions to go to ER or return to medical center if symptoms don't improve, worsen or new problems develop. The patient verbalized understanding.  ? ?Follow-up: Return in about 6 months (around 08/14/2022).  ? ?Gildardo Pounds, FNP-BC ?[place] ?[place], [place] ?[phone]   ?02/13/2022, 11:35 PM ?

## 2022-02-13 ENCOUNTER — Encounter: Payer: Self-pay | Admitting: Nurse Practitioner

## 2022-05-28 ENCOUNTER — Other Ambulatory Visit: Payer: Self-pay

## 2022-05-28 DIAGNOSIS — Z1231 Encounter for screening mammogram for malignant neoplasm of breast: Secondary | ICD-10-CM

## 2022-06-10 ENCOUNTER — Inpatient Hospital Stay: Admission: RE | Admit: 2022-06-10 | Payer: Self-pay | Source: Ambulatory Visit

## 2022-06-11 ENCOUNTER — Other Ambulatory Visit: Payer: Self-pay

## 2022-06-11 DIAGNOSIS — B20 Human immunodeficiency virus [HIV] disease: Secondary | ICD-10-CM

## 2022-06-12 ENCOUNTER — Ambulatory Visit
Admission: RE | Admit: 2022-06-12 | Discharge: 2022-06-12 | Disposition: A | Discharge status: Home / Self Care | Payer: No Typology Code available for payment source | Source: Ambulatory Visit | Attending: Nurse Practitioner | Admitting: Nurse Practitioner

## 2022-06-12 DIAGNOSIS — Z1231 Encounter for screening mammogram for malignant neoplasm of breast: Secondary | ICD-10-CM

## 2022-06-12 LAB — T-HELPER CELL (CD4) - (RCID CLINIC ONLY)
CD4 % Helper T Cell: 47 % (ref 33–65)
CD4 T Cell Abs: 1077 /uL (ref 400–1790)

## 2022-06-14 LAB — RPR: RPR Ser Ql: NONREACTIVE

## 2022-06-14 LAB — COMPREHENSIVE METABOLIC PANEL
AG Ratio: 1.3 (calc) (ref 1.0–2.5)
ALT: 28 U/L (ref 6–29)
AST: 21 U/L (ref 10–30)
Albumin: 3.9 g/dL (ref 3.6–5.1)
Alkaline phosphatase (APISO): 50 U/L (ref 31–125)
BUN: 11 mg/dL (ref 7–25)
CO2: 23 mmol/L (ref 20–32)
Calcium: 8.4 mg/dL — ABNORMAL LOW (ref 8.6–10.2)
Chloride: 104 mmol/L (ref 98–110)
Creat: 0.61 mg/dL (ref 0.50–0.99)
Globulin: 3 g/dL (calc) (ref 1.9–3.7)
Glucose, Bld: 104 mg/dL — ABNORMAL HIGH (ref 65–99)
Potassium: 3.9 mmol/L (ref 3.5–5.3)
Sodium: 136 mmol/L (ref 135–146)
Total Bilirubin: 0.3 mg/dL (ref 0.2–1.2)
Total Protein: 6.9 g/dL (ref 6.1–8.1)

## 2022-06-14 LAB — HIV-1 RNA QUANT-NO REFLEX-BLD
HIV 1 RNA Quant: <20 Copies/mL — ABNORMAL HIGH
HIV-1 RNA Quant, Log: <1.3 Log cps/mL — ABNORMAL HIGH

## 2022-06-14 LAB — CBC
HCT: 35.2 % (ref 35.0–45.0)
Hemoglobin: 12.1 g/dL (ref 11.7–15.5)
MCH: 31 pg (ref 27.0–33.0)
MCHC: 34.4 g/dL (ref 32.0–36.0)
MCV: 90.3 fL (ref 80.0–100.0)
MPV: 9.8 fL (ref 7.5–12.5)
Platelets: 332 10*3/uL (ref 140–400)
RBC: 3.9 10*6/uL (ref 3.80–5.10)
RDW: 12.9 % (ref 11.0–15.0)
WBC: 6.9 10*3/uL (ref 3.8–10.8)

## 2022-06-14 LAB — LIPID PANEL
Cholesterol: 209 mg/dL — ABNORMAL HIGH (ref <200)
HDL: 57 mg/dL (ref >=50)
LDL Cholesterol (Calc): 131 mg/dL (calc) — ABNORMAL HIGH
Non-HDL Cholesterol (Calc): 152 mg/dL (calc) — ABNORMAL HIGH (ref <130)
Total CHOL/HDL Ratio: 3.7 (calc) (ref <5.0)
Triglycerides: 100 mg/dL (ref <150)

## 2022-06-25 ENCOUNTER — Encounter: Payer: Self-pay | Admitting: Internal Medicine

## 2022-06-25 ENCOUNTER — Ambulatory Visit: Payer: Self-pay

## 2022-06-25 ENCOUNTER — Other Ambulatory Visit: Payer: Self-pay

## 2022-06-25 ENCOUNTER — Ambulatory Visit: Payer: Self-pay | Admitting: Internal Medicine

## 2022-06-25 DIAGNOSIS — B20 Human immunodeficiency virus [HIV] disease: Secondary | ICD-10-CM

## 2022-06-25 DIAGNOSIS — Z6841 Body Mass Index (BMI) 40.0 and over, adult: Secondary | ICD-10-CM

## 2022-06-25 DIAGNOSIS — G8929 Other chronic pain: Secondary | ICD-10-CM

## 2022-06-25 DIAGNOSIS — M5441 Lumbago with sciatica, right side: Secondary | ICD-10-CM

## 2022-06-25 MED ORDER — BIKTARVY 50-200-25 MG PO TABS: 1.0000 | ORAL_TABLET | Freq: Every day | ORAL | 11 refills | Status: DC

## 2022-06-25 NOTE — Assessment & Plan Note (Signed)
She has acute on chronic low back pain.  I suggested she follow-up with her primary care provider to discuss further evaluation and possible alternative treatments.

## 2022-06-25 NOTE — Progress Notes (Signed)
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   CALCIUM CARBONATE (TUMS - DOSED IN MG ELEMENTAL CALCIUM) 500 MG CHEWABLE TABLET    Chew 1 tablet by mouth daily.   CYCLOBENZAPRINE (FLEXERIL) 10 MG TABLET    Take 1 tablet (10 mg total) by mouth at bedtime. Prn leg/back pain   DICLOFENAC SODIUM (VOLTAREN) 1 % GEL    Apply 4 g topically 4 (four) times daily. To left knee.  Modified Medications   Modified Medication Previous Medication   BICTEGRAVIR-EMTRICITABINE-TENOFOVIR AF (BIKTARVY) 50-200-25 MG TABS TABLET bictegravir-emtricitabine-tenofovir AF (BIKTARVY) 50-200-25 MG TABS tablet      Take 1 tablet by mouth daily.    Take 1 tablet by mouth daily.  Discontinued Medications   No medications on file    Subjective: Kathleen Savage is in for her routine HIV follow-up visit.  She denies any problems obtaining, taking or tolerating her Biktarvy and thinks she has probably only missed 1 dose in the past year.  She takes it right before bedtime.  She continues to work long hours at [employer].  She will sometimes stand for up to 10 hours a day at work.  Recently she has been bothered by right lower  back pain that radiates down her right leg.  She takes a Tylenol 3 before bedtime that gives partial relief.  Recently her pain has been up to 8 out of 10.  Recent physical therapy did not make much difference in her pain.  She has had to call out of work recently because of the pain.  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  Review of Systems: Review of Systems  Constitutional:  Positive for malaise/fatigue. Negative for fever and weight loss.  Respiratory:  Negative for cough and shortness of breath.   Cardiovascular:  Negative for chest pain.  Gastrointestinal:  Negative for abdominal pain, diarrhea, nausea and vomiting.  Musculoskeletal:  Positive for back pain. Negative for falls.    Past Medical History:  Diagnosis Date   Glucose intolerance (pre-diabetes)    HIV infection ([facility])    Hyperlipidemia     Social History   Tobacco Use   Smoking status: Never   Smokeless tobacco: Never  Vaping Use   Vaping Use: Never used  Substance Use Topics   Alcohol use: Yes    Alcohol/week: 3.0 standard drinks of alcohol    Types: 1 Glasses of wine, 1 Cans of beer, 1 Shots of liquor per week    Comment: occasional   Drug use: No  Family History  Problem Relation Age of Onset   Cancer Maternal Aunt        Breast Cancer   Breast cancer Maternal Aunt    Cancer Cousin        Breast cancer - maternal cousin   Breast cancer Cousin    Cancer Cousin        breast cancer   Breast cancer Cousin    Cancer Cousin        breast cancer   Cancer Maternal Aunt        stomach cancer   Breast cancer Maternal Aunt    Hypertension Mother    Stroke Father    Cancer Sister    Uterine cancer Sister     No Known Allergies  Health Maintenance  Topic Date Due   COVID-19 Vaccine (1) Never done   PAP SMEAR-Modifier  02/11/2022   INFLUENZA VACCINE  06/10/2022   TETANUS/TDAP  02/13/2032   Hepatitis C Screening  Completed   HIV  Screening  Completed   HPV VACCINES  Aged Out    Objective:  Vitals:   06/25/22 0921  BP: (!) 142/89  Pulse: 73  Resp: 16  Temp: 98.2 F (36.8 C)  TempSrc: Oral  SpO2: 99%  Weight: 258 lb (117 kg)  Height: '5\' 1"'$  (1.549 m)   Body mass index is 48.75 kg/m.  Physical Exam Constitutional:      Comments: Her weight is unchanged.  Cardiovascular:     Rate and Rhythm: Normal rate and regular rhythm.     Heart sounds: No murmur heard. Pulmonary:     Effort: Pulmonary effort is normal.     Breath sounds: Normal breath sounds.  Abdominal:     Palpations: Abdomen is soft.     Tenderness: There is no abdominal tenderness.  Neurological:     General: No focal deficit present.     Gait: Gait normal.  Psychiatric:        Mood and Affect: Mood normal.     Lab Results Lab Results  Component Value Date   WBC 6.9 06/11/2022   HGB 12.1 06/11/2022   HCT 35.2 06/11/2022   MCV 90.3 06/11/2022   PLT 332 06/11/2022    Lab Results  Component Value Date   CREATININE 0.61 06/11/2022   BUN 11 06/11/2022   NA 136 06/11/2022   K 3.9 06/11/2022   CL 104 06/11/2022   CO2 23 06/11/2022    Lab Results  Component Value Date   ALT 28 06/11/2022   AST 21 06/11/2022   ALKPHOS 54 12/23/2021   BILITOT 0.3 06/11/2022    Lab Results  Component Value Date   CHOL 209 (H) 06/11/2022   HDL 57 06/11/2022   LDLCALC 131 (H) 06/11/2022   TRIG 100 06/11/2022   CHOLHDL 3.7 06/11/2022   Lab Results  Component Value Date   LABRPR NON-REACTIVE 06/11/2022   HIV 1 RNA Quant  Date Value  06/11/2022 <20 Copies/mL (H)  05/22/2020 <20 DETECTED copies/mL (A)  03/15/2019 <20 NOT DETECTED copies/mL   HIV-1 RNA Viral Load (copies/mL)  Date Value  12/23/2021 <20   CD4 T Cell Abs (/uL)  Date Value  06/11/2022 1,077  05/22/2020 966  03/15/2019 876     Problem List Items Addressed This Visit       High   Human immunodeficiency virus (HIV) disease ([place])    Her infection remains under  excellent, long-term control.  She will continue Biktarvy and follow-up after lab  work in 1 year.      Relevant Medications   bictegravir-emtricitabine-tenofovir AF (BIKTARVY) 50-200-25 MG TABS tablet   Other Relevant Orders   CBC   T-helper cells (CD4) count (not at [facility])   Comprehensive metabolic panel   Lipid panel   RPR   HIV-1 RNA quant-no reflex-bld     Medium    Morbid obesity ([place])    She suffers from morbid obesity.  Her back pain limits her activity and ability to lose weight.        Unprioritized   LOW BACK PAIN, CHRONIC    She has acute on chronic low back pain.  I suggested she follow-up with her primary care provider to discuss further evaluation and possible alternative treatments.         Michel Bickers, MD [facility] [place] [facility] [phone] pager   [phone] cell 06/25/2022, 9:42 AM

## 2022-06-25 NOTE — Assessment & Plan Note (Signed)
She suffers from morbid obesity.  Her back pain limits her activity and ability to lose weight.

## 2022-06-25 NOTE — Assessment & Plan Note (Signed)
Her infection remains under excellent, long-term control.  She will continue Biktarvy and follow-up after lab work in 1 year. 

## 2022-08-15 ENCOUNTER — Ambulatory Visit: Payer: Self-pay | Admitting: Nurse Practitioner

## 2022-09-24 ENCOUNTER — Other Ambulatory Visit (HOSPITAL_BASED_OUTPATIENT_CLINIC_OR_DEPARTMENT_OTHER): Payer: Self-pay

## 2022-09-24 ENCOUNTER — Ambulatory Visit: Payer: Self-pay | Attending: Nurse Practitioner | Admitting: Nurse Practitioner

## 2022-09-24 ENCOUNTER — Other Ambulatory Visit: Payer: Self-pay

## 2022-09-24 ENCOUNTER — Encounter: Payer: Self-pay | Admitting: Nurse Practitioner

## 2022-09-24 VITALS — BP 132/84 | HR 82 | Ht 61.0 in | Wt 255.0 lb

## 2022-09-24 DIAGNOSIS — G8929 Other chronic pain: Secondary | ICD-10-CM

## 2022-09-24 DIAGNOSIS — M5442 Lumbago with sciatica, left side: Secondary | ICD-10-CM

## 2022-09-24 DIAGNOSIS — R7303 Prediabetes: Secondary | ICD-10-CM

## 2022-09-24 DIAGNOSIS — Z1211 Encounter for screening for malignant neoplasm of colon: Secondary | ICD-10-CM

## 2022-09-24 DIAGNOSIS — M5441 Lumbago with sciatica, right side: Secondary | ICD-10-CM

## 2022-09-24 LAB — POCT GLYCOSYLATED HEMOGLOBIN (HGB A1C): HbA1c, POC (prediabetic range): 5.9 % (ref 5.7–6.4)

## 2022-09-24 MED ORDER — GABAPENTIN 300 MG PO CAPS
300.0000 mg | ORAL_CAPSULE | Freq: Three times a day (TID) | ORAL | 3 refills | Status: DC
  Filled 2022-09-24: qty 90, 30d supply, fill #0
  Filled 2022-10-26 – 2022-11-04 (×2): qty 90, 30d supply, fill #1
  Filled 2022-11-27 – 2022-12-08 (×2): qty 90, 30d supply, fill #2
  Filled 2023-01-21 – 2023-02-13 (×2): qty 90, 30d supply, fill #3

## 2022-09-24 MED ORDER — ACETAMINOPHEN-CODEINE 300-30 MG PO TABS
1.0000 | ORAL_TABLET | Freq: Three times a day (TID) | ORAL | 0 refills | Status: DC | PRN
  Filled 2022-09-24: qty 60, 10d supply, fill #0

## 2022-09-24 NOTE — Progress Notes (Signed)
Leg pain

## 2022-09-24 NOTE — Progress Notes (Signed)
Assessment & Plan:  Kathleen Savage was seen today for prediabetes.  Diagnoses and all orders for this visit:  Prediabetes -     POCT glycosylated hemoglobin (Hb A1C) Continue blood sugar control as discussed in office today, low carbohydrate diet, and regular physical exercise as tolerated, 150 minutes per week (30 min each day, 5 days per week, or 50 min 3 days per week). Annual eye exams and foot exams are recommended.   Chronic bilateral low back pain with bilateral sciatica -     gabapentin (NEURONTIN) 300 MG capsule; Take 1 capsule (300 mg total) by mouth 3 (three) times daily. -     acetaminophen-codeine (TYLENOL #3) 300-30 MG tablet; Take 1-2 tablets by mouth every 8 (eight) hours as needed for moderate pain. Patient has been advised to apply for financial assistance and schedule to see our financial counselor.   Work on losing weight to help reduce back pain. May alternate with heat and ice application for pain relief. May also alternate with acetaminophen and Ibuprofen as prescribed for back pain. Other alternatives include massage, acupuncture and water aerobics.     Colon cancer screening -     Fecal occult blood, imunochemical([facility])    Patient has been counseled on age-appropriate routine health concerns for screening and prevention. These are reviewed and up-to-date. Referrals have been placed accordingly. Immunizations are up-to-date or declined.    Subjective:   Chief Complaint  Patient presents with   Prediabetes   HPI Kathleen Savage 46 y.o. female presents to office today for follow up to prediabetes and chronic low back pain.    Prediabetes A1c currently at goal.  Lab Results  Component Value Date   HGBA1C 5.9 09/24/2022    Lab Results  Component Value Date   LDLCALC 131 (H) 06/11/2022      Chronic Low back pain with BLE sciatica BMI 46. Worse with prolonged standing, sitting and lying down. Pain radiates down both legs. Medications tried in  the past include: mobic, flexeril, prednisone. Treatments include: physical therapy. She has seen Orthopedic specialist in the past as well. Denies any bowel or bladder incontinence. Currently taking tylenol 3 as needed which helps ease the pain however the neuropathy in her legs is still present  Review of Systems  Constitutional:  Negative for fever, malaise/fatigue and weight loss.  HENT: Negative.  Negative for nosebleeds.   Eyes: Negative.  Negative for blurred vision, double vision and photophobia.  Respiratory: Negative.  Negative for cough and shortness of breath.   Cardiovascular: Negative.  Negative for chest pain, palpitations and leg swelling.  Gastrointestinal: Negative.  Negative for heartburn, nausea and vomiting.  Musculoskeletal:  Positive for back pain. Negative for myalgias.  Neurological: Negative.  Negative for dizziness, focal weakness, seizures and headaches.  Psychiatric/Behavioral:  Positive for depression (Notes increased depression due to her decreased mobiity 2/2 neuropathy in legs. She is not active as she would like to be. Unable to exercise to lose weight. Also recently found out her son has been cutting himself and is depressed as well.). Negative for suicidal ideas.     Past Medical History:  Diagnosis Date   Glucose intolerance (pre-diabetes)    HIV infection ([facility])    Hyperlipidemia     History reviewed. No pertinent surgical history.  Family History  Problem Relation Age of Onset   Cancer Maternal Aunt        Breast Cancer   Breast cancer Maternal Aunt    Cancer Cousin  Breast cancer - maternal cousin   Breast cancer Cousin    Cancer Cousin        breast cancer   Breast cancer Cousin    Cancer Cousin        breast cancer   Cancer Maternal Aunt        stomach cancer   Breast cancer Maternal Aunt    Hypertension Mother    Stroke Father    Cancer Sister    Uterine cancer Sister     Social History Reviewed with no changes to be made  today.   Outpatient Medications Prior to Visit  Medication Sig Dispense Refill   bictegravir-emtricitabine-tenofovir AF (BIKTARVY) 50-200-25 MG TABS tablet Take 1 tablet by mouth daily. 30 tablet 11   calcium carbonate (TUMS - DOSED IN MG ELEMENTAL CALCIUM) 500 MG chewable tablet Chew 1 tablet by mouth daily.     cyclobenzaprine (FLEXERIL) 10 MG tablet Take 1 tablet (10 mg total) by mouth at bedtime. Prn leg/back pain 30 tablet 1   diclofenac Sodium (VOLTAREN) 1 % GEL Apply 4 g topically 4 (four) times daily. To left knee. 150 g 1   acetaminophen-codeine (TYLENOL #3) 300-30 MG tablet Take 1-2 tablets by mouth every 4 (four) hours as needed for moderate pain. (Patient not taking: Reported on 09/24/2022) 30 tablet 0   No facility-administered medications prior to visit.    No Known Allergies     Objective:    BP 132/84   Pulse 82   Ht '5\' 1"'$  (1.549 m)   Wt 255 lb (115.7 kg)   LMP 09/17/2022 (Approximate)   SpO2 97%   BMI 48.18 kg/m  Wt Readings from Last 3 Encounters:  09/24/22 255 lb (115.7 kg)  06/25/22 258 lb (117 kg)  02/12/22 256 lb (116.1 kg)    Physical Exam Vitals and nursing note reviewed.  Constitutional:      Appearance: She is well-developed.  HENT:     Head: Normocephalic and atraumatic.  Cardiovascular:     Rate and Rhythm: Normal rate and regular rhythm.     Heart sounds: Normal heart sounds. No murmur heard.    No friction rub. No gallop.  Pulmonary:     Effort: Pulmonary effort is normal. No tachypnea or respiratory distress.     Breath sounds: Normal breath sounds. No decreased breath sounds, wheezing, rhonchi or rales.  Chest:     Chest wall: No tenderness.  Abdominal:     General: Bowel sounds are normal.     Palpations: Abdomen is soft.  Musculoskeletal:        General: Normal range of motion.     Cervical back: Normal range of motion.  Skin:    General: Skin is warm and dry.  Neurological:     Mental Status: She is alert and oriented to  person, place, and time.     Coordination: Coordination normal.  Psychiatric:        Behavior: Behavior normal. Behavior is cooperative.        Thought Content: Thought content normal.        Judgment: Judgment normal.          Patient has been counseled extensively about nutrition and exercise as well as the importance of adherence with medications and regular follow-up. The patient was given clear instructions to go to ER or return to medical center if symptoms don't improve, worsen or new problems develop. The patient verbalized understanding.   Follow-up: Return in about 3 months (  around 12/25/2022) for back pain.   Gildardo Pounds, FNP-BC [facility] [place], [place]   09/24/2022, 11:07 AM

## 2022-10-27 ENCOUNTER — Other Ambulatory Visit (HOSPITAL_COMMUNITY): Payer: Self-pay

## 2022-10-27 ENCOUNTER — Encounter (HOSPITAL_COMMUNITY): Payer: Self-pay

## 2022-10-27 ENCOUNTER — Other Ambulatory Visit: Payer: Self-pay

## 2022-10-31 ENCOUNTER — Other Ambulatory Visit: Payer: Self-pay

## 2022-11-04 ENCOUNTER — Other Ambulatory Visit (HOSPITAL_COMMUNITY): Payer: Self-pay

## 2022-11-24 ENCOUNTER — Ambulatory Visit: Payer: Self-pay | Admitting: *Deleted

## 2022-11-24 NOTE — Telephone Encounter (Signed)
Summary: Sore throat/ rsv?   The patient called in stating she has a very sore throat and it has been bothering her for a few days. She states her grandbaby was recently diagnosed with RSV and she is worried about that and so is her work. She does not complain of any other symptoms at this time. There is not an available appt for a few weeks. Please assist patient further         Reason for Disposition  [1] Sore throat is the only symptom AND [2] present > 48 hours  Answer Assessment - Initial Assessment Questions 1. ONSET: "When did the throat start hurting?" (Hours or days ago)      yesterday 2. SEVERITY: "How bad is the sore throat?" (Scale 1-10; mild, moderate or severe)   - MILD (1-3):  Doesn't interfere with eating or normal activities.   - MODERATE (4-7): Interferes with eating some solids and normal activities.   - SEVERE (8-10):  Excruciating pain, interferes with most normal activities.   - SEVERE WITH DYSPHAGIA (10): Can't swallow liquids, drooling.     moderate 3. STREP EXPOSURE: "Has there been any exposure to strep within the past week?" If Yes, ask: "What type of contact occurred?"      no 4.  VIRAL SYMPTOMS: "Are there any symptoms of a cold, such as a runny nose, cough, hoarse voice or red eyes?"      cough 5. FEVER: "Do you have a fever?" If Yes, ask: "What is your temperature, how was it measured, and when did it start?"     no 6. PUS ON THE TONSILS: "Is there pus on the tonsils in the back of your throat?"     na 7. OTHER SYMPTOMS: "Do you have any other symptoms?" (e.g., difficulty breathing, headache, rash)     headache  Protocols used: Sore Throat-A-AH

## 2022-11-24 NOTE — Telephone Encounter (Signed)
  Chief Complaint: sore throat, RSV exposure Symptoms: sore throat, cough Frequency: symptoms started yesterday Pertinent Negatives: Patient denies fever Disposition: '[]'$ ED /'[]'$ Urgent Care (no appt availability in office) / '[]'$ Appointment(In office/virtual)/ '[]'$  [facility]/ '[]'$ Home Care/ '[]'$ Refused Recommended Disposition /'[x]'$ [facility]/ '[]'$  Follow-up with PCP Additional Notes: No open appointment- patient needs testing for work- advised mobile unit

## 2022-11-27 ENCOUNTER — Other Ambulatory Visit (HOSPITAL_COMMUNITY): Payer: Self-pay

## 2022-12-09 ENCOUNTER — Other Ambulatory Visit (HOSPITAL_COMMUNITY): Payer: Self-pay

## 2022-12-29 ENCOUNTER — Ambulatory Visit: Payer: Self-pay | Attending: Nurse Practitioner | Admitting: Nurse Practitioner

## 2022-12-29 ENCOUNTER — Other Ambulatory Visit: Payer: Self-pay

## 2022-12-29 ENCOUNTER — Encounter: Payer: Self-pay | Admitting: Nurse Practitioner

## 2022-12-29 VITALS — BP 122/83 | HR 71 | Ht 61.0 in | Wt 256.6 lb

## 2022-12-29 DIAGNOSIS — R7303 Prediabetes: Secondary | ICD-10-CM

## 2022-12-29 DIAGNOSIS — Z1211 Encounter for screening for malignant neoplasm of colon: Secondary | ICD-10-CM

## 2022-12-29 DIAGNOSIS — G8929 Other chronic pain: Secondary | ICD-10-CM

## 2022-12-29 DIAGNOSIS — M5442 Lumbago with sciatica, left side: Secondary | ICD-10-CM

## 2022-12-29 DIAGNOSIS — M5441 Lumbago with sciatica, right side: Secondary | ICD-10-CM

## 2022-12-29 DIAGNOSIS — E7800 Pure hypercholesterolemia, unspecified: Secondary | ICD-10-CM

## 2022-12-29 DIAGNOSIS — Z130 Encounter for screening for diseases of the blood and blood-forming organs and certain disorders involving the immune mechanism: Secondary | ICD-10-CM

## 2022-12-29 DIAGNOSIS — Z23 Encounter for immunization: Secondary | ICD-10-CM

## 2022-12-29 MED ORDER — CYCLOBENZAPRINE HCL 10 MG PO TABS
10.0000 mg | ORAL_TABLET | Freq: Every day | ORAL | 2 refills | Status: DC
  Filled 2022-12-29: qty 30, 30d supply, fill #0
  Filled 2023-01-21 – 2023-02-13 (×2): qty 30, 30d supply, fill #1
  Filled 2023-03-08 – 2023-06-19 (×6): qty 30, 30d supply, fill #2

## 2022-12-29 MED ORDER — ACETAMINOPHEN-CODEINE 300-30 MG PO TABS
1.0000 | ORAL_TABLET | Freq: Three times a day (TID) | ORAL | 0 refills | Status: DC | PRN
  Filled 2022-12-29: qty 60, 10d supply, fill #0

## 2022-12-29 NOTE — Progress Notes (Signed)
Assessment & Plan:  Kathleen Savage was seen today for leg pain.  Diagnoses and all orders for this visit:  Chronic bilateral low back pain with bilateral sciatica Pain contract signed today -     acetaminophen-codeine (TYLENOL #3) 300-30 MG tablet; Take 1-2 tablets by mouth every 8 (eight) hours as needed for moderate pain. -     Cancel: Drug Screen 10 W/Conf, Serum -     cyclobenzaprine (FLEXERIL) 10 MG tablet; Take 1 tablet (10 mg total) by mouth at bedtime as needed for leg/back pain -     Drug Screen 10 W/Conf, Serum Work on losing weight to help reduce back pain. May alternate with heat and ice application for pain relief. May also alternate with Ibuprofen as prescribed for back pain. Other alternatives include massage, acupuncture and water aerobics.  You must stay active and avoid a sedentary lifestyle.    Colon cancer screening -     Fecal occult blood, imunochemical([facility])  Hypercholesterolemia -     Lipid panel INSTRUCTIONS: Work on a low fat, heart healthy diet and participate in regular aerobic exercise program by working out at least 150 minutes per week; 5 days a week-30 minutes per day. Avoid red meat/beef/steak,  fried foods. junk foods, sodas, sugary drinks, unhealthy snacking, alcohol and smoking.  Drink at least 80 oz of water per day and monitor your carbohydrate intake daily.    Low calcium levels -     CMP14+EGFR  Screening for deficiency anemia -     CBC with Differential  Need for immunization against influenza -     Flu Vaccine QUAD 1moIM (Fluarix, Fluzone & Alfiuria Quad PF)  Prediabetes Continue blood sugar control as discussed in office today, low carbohydrate diet, and regular physical exercise as tolerated, 150 minutes per week (30 min each day, 5 days per week, or 50 min 3 days per week).   Patient has been counseled on age-appropriate routine health concerns for screening and prevention. These are reviewed and up-to-date. Referrals have been  placed accordingly. Immunizations are up-to-date or declined.    Subjective:   Chief Complaint  Patient presents with   Leg Pain   Back Pain   HPI Kathleen Savage 47y.o. female presents to office today for follow-up to chronic low back pain with bilateral lower extremity sciatica  Chronic Low back pain with BLE sciatica BMI now increased from 437to 48.  Back pain is worse with prolonged standing, sitting and lying down. Pain radiates down both legs. Medications tried in the past include: mobic, flexeril, prednisone. Treatments include: physical therapy. She has seen Orthopedic specialist in the past as well. Denies any bowel or bladder incontinence. Currently taking tylenol 3 and gabapentin 300 mg 3 times daily which helps ease the pain however the neuropathy in her legs is still present Lab Results  Component Value Date   HGBA1C 5.9 09/24/2022     Review of Systems  Constitutional:  Negative for fever, malaise/fatigue and weight loss.  HENT: Negative.  Negative for nosebleeds.   Eyes: Negative.  Negative for blurred vision, double vision and photophobia.  Respiratory: Negative.  Negative for cough and shortness of breath.   Cardiovascular: Negative.  Negative for chest pain, palpitations and leg swelling.  Gastrointestinal: Negative.  Negative for heartburn, nausea and vomiting.  Musculoskeletal:  Positive for back pain. Negative for myalgias.  Neurological:  Positive for tingling and sensory change. Negative for dizziness, focal weakness, seizures and headaches.  Psychiatric/Behavioral: Negative.  Negative for  suicidal ideas.     Past Medical History:  Diagnosis Date   Glucose intolerance (pre-diabetes)    HIV infection (HCC)    Hyperlipidemia     History reviewed. No pertinent surgical history.  Family History  Problem Relation Age of Onset   Cancer Maternal Aunt        Breast Cancer   Breast cancer Maternal Aunt    Cancer Cousin        Breast cancer - maternal  cousin   Breast cancer Cousin    Cancer Cousin        breast cancer   Breast cancer Cousin    Cancer Cousin        breast cancer   Cancer Maternal Aunt        stomach cancer   Breast cancer Maternal Aunt    Hypertension Mother    Stroke Father    Cancer Sister    Uterine cancer Sister     Social History Reviewed with no changes to be made today.   Outpatient Medications Prior to Visit  Medication Sig Dispense Refill   bictegravir-emtricitabine-tenofovir AF (BIKTARVY) 50-200-25 MG TABS tablet Take 1 tablet by mouth daily. 30 tablet 11   calcium carbonate (TUMS - DOSED IN MG ELEMENTAL CALCIUM) 500 MG chewable tablet Chew 1 tablet by mouth daily.     diclofenac Sodium (VOLTAREN) 1 % GEL Apply 4 g topically 4 (four) times daily. To left knee. 150 g 1   gabapentin (NEURONTIN) 300 MG capsule Take 1 capsule (300 mg total) by mouth 3 (three) times daily. 90 capsule 3   acetaminophen-codeine (TYLENOL #3) 300-30 MG tablet Take 1-2 tablets by mouth every 8 (eight) hours as needed for moderate pain. 60 tablet 0   cyclobenzaprine (FLEXERIL) 10 MG tablet Take 1 tablet (10 mg total) by mouth at bedtime. Prn leg/back pain 30 tablet 1   No facility-administered medications prior to visit.    No Known Allergies     Objective:    BP 122/83   Pulse 71   Ht 5' 1"$  (1.549 m)   Wt 256 lb 9.6 oz (116.4 kg)   LMP 12/12/2022 (Approximate)   SpO2 100%   BMI 48.48 kg/m  Wt Readings from Last 3 Encounters:  12/29/22 256 lb 9.6 oz (116.4 kg)  09/24/22 255 lb (115.7 kg)  06/25/22 258 lb (117 kg)    Physical Exam Vitals and nursing note reviewed.  Constitutional:      Appearance: She is well-developed. She is obese.  HENT:     Head: Normocephalic and atraumatic.  Cardiovascular:     Rate and Rhythm: Normal rate and regular rhythm.     Heart sounds: Normal heart sounds. No murmur heard.    No friction rub. No gallop.  Pulmonary:     Effort: Pulmonary effort is normal. No tachypnea or  respiratory distress.     Breath sounds: Normal breath sounds. No decreased breath sounds, wheezing, rhonchi or rales.  Chest:     Chest wall: No tenderness.  Abdominal:     General: Bowel sounds are normal.     Palpations: Abdomen is soft.  Musculoskeletal:        General: Normal range of motion.     Cervical back: Normal range of motion.  Skin:    General: Skin is warm and dry.  Neurological:     Mental Status: She is alert and oriented to person, place, and time.     Coordination: Coordination normal.  Psychiatric:  Behavior: Behavior normal. Behavior is cooperative.        Thought Content: Thought content normal.        Judgment: Judgment normal.          Patient has been counseled extensively about nutrition and exercise as well as the importance of adherence with medications and regular follow-up. The patient was given clear instructions to go to ER or return to medical center if symptoms don't improve, worsen or new problems develop. The patient verbalized understanding.   Follow-up: Return in about 3 months (around 03/29/2023).   Gildardo Pounds, FNP-BC [facility] and [place], [place]   12/29/2022, 2:08 PM

## 2022-12-30 LAB — CBC WITH DIFFERENTIAL/PLATELET
Basophils Absolute: 0.1 10*3/uL (ref 0.0–0.2)
Basos: 1 %
EOS (ABSOLUTE): 0.3 10*3/uL (ref 0.0–0.4)
Eos: 4 %
Hematocrit: 40 % (ref 34.0–46.6)
Hemoglobin: 13.7 g/dL (ref 11.1–15.9)
Immature Grans (Abs): 0 10*3/uL (ref 0.0–0.1)
Immature Granulocytes: 0 %
Lymphocytes Absolute: 2.2 10*3/uL (ref 0.7–3.1)
Lymphs: 30 %
MCH: 30 pg (ref 26.6–33.0)
MCHC: 34.3 g/dL (ref 31.5–35.7)
MCV: 88 fL (ref 79–97)
Monocytes Absolute: 0.4 10*3/uL (ref 0.1–0.9)
Monocytes: 5 %
Neutrophils Absolute: 4.4 10*3/uL (ref 1.4–7.0)
Neutrophils: 60 %
Platelets: 363 10*3/uL (ref 150–450)
RBC: 4.57 x10E6/uL (ref 3.77–5.28)
RDW: 12.7 % (ref 11.7–15.4)
WBC: 7.4 10*3/uL (ref 3.4–10.8)

## 2022-12-30 LAB — CMP14+EGFR
ALT: 26 IU/L (ref 0–32)
AST: 21 IU/L (ref 0–40)
Albumin/Globulin Ratio: 1.4 (ref 1.2–2.2)
Albumin: 4.7 g/dL (ref 3.9–4.9)
Alkaline Phosphatase: 69 IU/L (ref 44–121)
BUN/Creatinine Ratio: 15 (ref 9–23)
BUN: 11 mg/dL (ref 6–24)
Bilirubin Total: 0.3 mg/dL (ref 0.0–1.2)
CO2: 21 mmol/L (ref 20–29)
Calcium: 8.9 mg/dL (ref 8.7–10.2)
Chloride: 103 mmol/L (ref 96–106)
Creatinine, Ser: 0.73 mg/dL (ref 0.57–1.00)
Globulin, Total: 3.4 g/dL (ref 1.5–4.5)
Glucose: 109 mg/dL — ABNORMAL HIGH (ref 70–99)
Potassium: 4.4 mmol/L (ref 3.5–5.2)
Sodium: 138 mmol/L (ref 134–144)
Total Protein: 8.1 g/dL (ref 6.0–8.5)
eGFR: 103 mL/min/{1.73_m2} (ref >59)

## 2022-12-30 LAB — LIPID PANEL
Chol/HDL Ratio: 3.9 ratio (ref 0.0–4.4)
Cholesterol, Total: 221 mg/dL — ABNORMAL HIGH (ref 100–199)
HDL: 56 mg/dL (ref >39)
LDL Chol Calc (NIH): 149 mg/dL — ABNORMAL HIGH (ref 0–99)
Triglycerides: 93 mg/dL (ref 0–149)
VLDL Cholesterol Cal: 16 mg/dL (ref 5–40)

## 2023-01-01 LAB — DRUG SCREEN 10 W/CONF, SERUM
Amphetamines, IA: NEGATIVE ng/mL
Barbiturates, IA: NEGATIVE ug/mL
Benzodiazepines, IA: NEGATIVE ng/mL
Cocaine & Metabolite, IA: NEGATIVE ng/mL
Methadone, IA: NEGATIVE ng/mL
Opiates, IA: NEGATIVE ng/mL
Oxycodones, IA: NEGATIVE ng/mL
Phencyclidine, IA: NEGATIVE ng/mL
Propoxyphene, IA: NEGATIVE ng/mL
THC(Marijuana) Metabolite, IA: NEGATIVE ng/mL

## 2023-01-22 ENCOUNTER — Other Ambulatory Visit (HOSPITAL_COMMUNITY): Payer: Self-pay

## 2023-01-30 ENCOUNTER — Other Ambulatory Visit (HOSPITAL_COMMUNITY): Payer: Self-pay

## 2023-02-13 ENCOUNTER — Other Ambulatory Visit (HOSPITAL_COMMUNITY): Payer: Self-pay

## 2023-02-23 ENCOUNTER — Other Ambulatory Visit: Payer: Self-pay | Admitting: Nurse Practitioner

## 2023-02-23 DIAGNOSIS — G8929 Other chronic pain: Secondary | ICD-10-CM

## 2023-02-24 MED ORDER — ACETAMINOPHEN-CODEINE 300-30 MG PO TABS
1.0000 | ORAL_TABLET | Freq: Three times a day (TID) | ORAL | 0 refills | Status: DC | PRN
  Filled 2023-02-24 – 2023-03-03 (×2): qty 60, 10d supply, fill #0

## 2023-02-25 ENCOUNTER — Other Ambulatory Visit: Payer: Self-pay

## 2023-02-25 ENCOUNTER — Other Ambulatory Visit (HOSPITAL_COMMUNITY): Payer: Self-pay

## 2023-02-25 ENCOUNTER — Encounter (HOSPITAL_COMMUNITY): Payer: Self-pay

## 2023-03-03 ENCOUNTER — Other Ambulatory Visit: Payer: Self-pay

## 2023-03-08 ENCOUNTER — Other Ambulatory Visit: Payer: Self-pay | Admitting: Nurse Practitioner

## 2023-03-08 DIAGNOSIS — G8929 Other chronic pain: Secondary | ICD-10-CM

## 2023-03-09 ENCOUNTER — Other Ambulatory Visit: Payer: Self-pay

## 2023-03-10 ENCOUNTER — Other Ambulatory Visit: Payer: Self-pay

## 2023-03-10 ENCOUNTER — Other Ambulatory Visit (HOSPITAL_COMMUNITY): Payer: Self-pay

## 2023-03-10 ENCOUNTER — Encounter: Payer: Self-pay | Admitting: Pharmacist

## 2023-03-10 MED ORDER — GABAPENTIN 300 MG PO CAPS
300.0000 mg | ORAL_CAPSULE | Freq: Three times a day (TID) | ORAL | 3 refills | Status: DC
  Filled 2023-03-10 – 2023-06-19 (×6): qty 90, 30d supply, fill #0
  Filled 2023-07-29 – 2023-08-17 (×4): qty 90, 30d supply, fill #1
  Filled 2023-11-11: qty 90, 30d supply, fill #2
  Filled 2023-12-17: qty 90, 30d supply, fill #3

## 2023-03-16 ENCOUNTER — Other Ambulatory Visit: Payer: Self-pay

## 2023-03-31 ENCOUNTER — Other Ambulatory Visit: Payer: Self-pay

## 2023-03-31 ENCOUNTER — Encounter: Payer: Self-pay | Admitting: Nurse Practitioner

## 2023-03-31 ENCOUNTER — Ambulatory Visit: Payer: Self-pay | Attending: Nurse Practitioner | Admitting: Nurse Practitioner

## 2023-03-31 VITALS — BP 96/67 | HR 75 | Ht 61.0 in | Wt 256.2 lb

## 2023-03-31 DIAGNOSIS — M5442 Lumbago with sciatica, left side: Secondary | ICD-10-CM

## 2023-03-31 DIAGNOSIS — Z1211 Encounter for screening for malignant neoplasm of colon: Secondary | ICD-10-CM

## 2023-03-31 DIAGNOSIS — J069 Acute upper respiratory infection, unspecified: Secondary | ICD-10-CM

## 2023-03-31 DIAGNOSIS — G8929 Other chronic pain: Secondary | ICD-10-CM

## 2023-03-31 DIAGNOSIS — M5441 Lumbago with sciatica, right side: Secondary | ICD-10-CM

## 2023-03-31 DIAGNOSIS — R7303 Prediabetes: Secondary | ICD-10-CM

## 2023-03-31 LAB — POCT GLYCOSYLATED HEMOGLOBIN (HGB A1C): HbA1c, POC (prediabetic range): 5.7 % (ref 5.7–6.4)

## 2023-03-31 MED ORDER — PROMETHAZINE-DM 6.25-15 MG/5ML PO SYRP
5.0000 mL | ORAL_SOLUTION | Freq: Four times a day (QID) | ORAL | 0 refills | Status: DC | PRN
Start: 2023-03-31 — End: 2023-05-08
  Filled 2023-03-31: qty 240, 12d supply, fill #0

## 2023-03-31 NOTE — Progress Notes (Signed)
Assessment & Plan:  Kathleen Savage was seen today for prediabetes.  Diagnoses and all orders for this visit:  Prediabetes -     POCT glycosylated hemoglobin (Hb A1C)  Colon cancer screening -     Fecal occult blood, imunochemical  Chronic bilateral low back pain with bilateral sciatica -     DG Lumbar Spine Complete; Future -     Ambulatory referral to Physical Medicine Rehab Work on losing weight to help reduce back pain. May alternate with heat and ice application for pain relief. May also alternate with acetaminophen and Ibuprofen as prescribed for back pain. Other alternatives include massage, acupuncture and water aerobics.      Viral URI with cough Could also be related to undiagnosed GERD. -     CBC with Differential -     promethazine-dextromethorphan (PROMETHAZINE-DM) 6.25-15 MG/5ML syrup; Take 5 mLs by mouth 4 (four) times daily as needed for cough.    Patient has been counseled on age-appropriate routine health concerns for screening and prevention. These are reviewed and up-to-date. Referrals have been placed accordingly. Immunizations are up-to-date or declined.    Subjective:   Chief Complaint  Patient presents with   Prediabetes   HPI Kathleen Savage 47 y.o. female presents to office today for follow up to prediabetes and chronic low back pain.   Prediabetes Well-controlled at this time. Lab Results  Component Value Date   HGBA1C 5.7 03/31/2023    Chronic Low back pain with BLE sciatica BMI now increased from 46 to 48.  Back pain is worse with prolonged standing, sitting and lying down. Pain radiates down both legs. Medications tried in the past include: mobic, flexeril, prednisone. Treatments include: physical therapy. She has seen Orthopedic specialist in the past as well. Denies any bowel or bladder incontinence. 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   Cough: Patient complains of nonproductive cough and productive cough.  Symptoms began 2 weeks ago.  The cough is non-productive, without wheezing, dyspnea or hemoptysis and alternating productive of clear sputum and is aggravated by reclining position Associated symptoms include: none . Patient does not have new pets. Patient does not have a history of asthma. Patient does not have a history of environmental allergens. Patient has not recently traveled. Patient does not have a history of smoking. Patient  does not have previous Chest X-ray.   Review of Systems  Constitutional:  Negative for fever, malaise/fatigue and weight loss.  HENT: Negative.  Negative for nosebleeds.   Eyes: Negative.  Negative for blurred vision, double vision and photophobia.  Respiratory:  Positive for cough and sputum production. Negative for hemoptysis, shortness of breath and wheezing.   Cardiovascular: Negative.  Negative for chest pain, palpitations and leg swelling.  Gastrointestinal: Negative.  Negative for heartburn, nausea and vomiting.  Musculoskeletal:  Positive for back pain. Negative for myalgias.  Neurological: Negative.  Negative for dizziness, focal weakness, seizures and headaches.  Psychiatric/Behavioral:  Positive for depression. Negative for suicidal ideas.     Past Medical History:  Diagnosis Date   Glucose intolerance (pre-diabetes)    HIV infection (HCC)    Hyperlipidemia     No past surgical history on file.  Family History  Problem Relation Age of Onset  Cancer Maternal Aunt        Breast Cancer   Breast cancer Maternal Aunt    Cancer Cousin        Breast cancer - maternal cousin   Breast cancer Cousin    Cancer Cousin        breast cancer   Breast cancer Cousin    Cancer Cousin        breast cancer   Cancer Maternal Aunt         stomach cancer   Breast cancer Maternal Aunt    Hypertension Mother    Stroke Father    Cancer Sister    Uterine cancer Sister     Social History Reviewed with no changes to be made today.   Outpatient Medications Prior to Visit  Medication Sig Dispense Refill   acetaminophen-codeine (TYLENOL #3) 300-30 MG tablet Take 1-2 tablets by mouth every 8 (eight) hours as needed for moderate pain. 60 tablet 0   bictegravir-emtricitabine-tenofovir AF (BIKTARVY) 50-200-25 MG TABS tablet Take 1 tablet by mouth daily. 30 tablet 11   calcium carbonate (TUMS - DOSED IN MG ELEMENTAL CALCIUM) 500 MG chewable tablet Chew 1 tablet by mouth daily.     cyclobenzaprine (FLEXERIL) 10 MG tablet Take 1 tablet (10 mg total) by mouth at bedtime as needed for leg/back pain 30 tablet 2   diclofenac Sodium (VOLTAREN) 1 % GEL Apply 4 g topically 4 (four) times daily. To left knee. 150 g 1   gabapentin (NEURONTIN) 300 MG capsule Take 1 capsule (300 mg total) by mouth 3 (three) times daily. 90 capsule 3   No facility-administered medications prior to visit.    No Known Allergies     Objective:    BP 96/67 (BP Location: Left Arm, Patient Position: Sitting, Cuff Size: Large)   Pulse 75   Ht 5\' 1"  (1.549 m)   Wt 256 lb 3.2 oz (116.2 kg)   LMP 03/30/2023   SpO2 98%   BMI 48.41 kg/m  Wt Readings from Last 3 Encounters:  03/31/23 256 lb 3.2 oz (116.2 kg)  12/29/22 256 lb 9.6 oz (116.4 kg)  09/24/22 255 lb (115.7 kg)    Physical Exam Vitals and nursing note reviewed.  Constitutional:      Appearance: She is well-developed.  HENT:     Head: Normocephalic and atraumatic.  Cardiovascular:     Rate and Rhythm: Normal rate and regular rhythm.     Heart sounds: Normal heart sounds. No murmur heard.    No friction rub. No gallop.  Pulmonary:     Effort: Pulmonary effort is normal. No tachypnea or respiratory distress.     Breath sounds: Normal breath sounds. No decreased breath sounds, wheezing, rhonchi  or rales.  Chest:     Chest wall: No tenderness.  Abdominal:     General: Bowel sounds are normal.     Palpations: Abdomen is soft.  Musculoskeletal:        General: Normal range of motion.     Cervical back: Normal range of motion.  Skin:    General: Skin is warm and dry.  Neurological:     Mental Status: She is alert and oriented to person, place, and time.     Coordination: Coordination normal.  Psychiatric:        Behavior: Behavior normal. Behavior is cooperative.        Thought Content: Thought content normal.        Judgment: Judgment normal.  Patient has been counseled extensively about nutrition and exercise as well as the importance of adherence with medications and regular follow-up. The patient was given clear instructions to go to ER or return to medical center if symptoms don't improve, worsen or new problems develop. The patient verbalized understanding.   Follow-up: Return in about 3 months (around 07/01/2023).   Claiborne Rigg, FNP-BC [facility] and [facility] [place], [place] [phone]   03/31/2023, 9:33 AM

## 2023-03-31 NOTE — Progress Notes (Signed)
Lower back pain 

## 2023-04-01 LAB — CBC WITH DIFFERENTIAL/PLATELET
Basophils Absolute: 0 10*3/uL (ref 0.0–0.2)
Basos: 0 %
EOS (ABSOLUTE): 0.2 10*3/uL (ref 0.0–0.4)
Eos: 3 %
Hematocrit: 37.4 % (ref 34.0–46.6)
Hemoglobin: 11.9 g/dL (ref 11.1–15.9)
Immature Grans (Abs): 0 10*3/uL (ref 0.0–0.1)
Immature Granulocytes: 0 %
Lymphocytes Absolute: 2.1 10*3/uL (ref 0.7–3.1)
Lymphs: 30 %
MCH: 28.1 pg (ref 26.6–33.0)
MCHC: 31.8 g/dL (ref 31.5–35.7)
MCV: 88 fL (ref 79–97)
Monocytes Absolute: 0.5 10*3/uL (ref 0.1–0.9)
Monocytes: 7 %
Neutrophils Absolute: 4.2 10*3/uL (ref 1.4–7.0)
Neutrophils: 60 %
Platelets: 353 10*3/uL (ref 150–450)
RBC: 4.24 x10E6/uL (ref 3.77–5.28)
RDW: 13.3 % (ref 11.7–15.4)
WBC: 7.1 10*3/uL (ref 3.4–10.8)

## 2023-04-03 ENCOUNTER — Ambulatory Visit (HOSPITAL_COMMUNITY)
Admission: RE | Admit: 2023-04-03 | Discharge: 2023-04-03 | Disposition: A | Discharge status: Home / Self Care | Payer: Self-pay | Source: Ambulatory Visit | Attending: Nurse Practitioner | Admitting: Nurse Practitioner

## 2023-04-03 DIAGNOSIS — G8929 Other chronic pain: Secondary | ICD-10-CM | POA: Insufficient documentation

## 2023-04-03 DIAGNOSIS — M5442 Lumbago with sciatica, left side: Secondary | ICD-10-CM | POA: Insufficient documentation

## 2023-04-03 DIAGNOSIS — M5441 Lumbago with sciatica, right side: Secondary | ICD-10-CM | POA: Insufficient documentation

## 2023-04-07 ENCOUNTER — Encounter: Payer: Self-pay | Admitting: Physical Medicine & Rehabilitation

## 2023-04-07 ENCOUNTER — Other Ambulatory Visit: Payer: Self-pay

## 2023-04-07 ENCOUNTER — Encounter: Payer: Self-pay | Admitting: Pharmacist

## 2023-04-07 ENCOUNTER — Other Ambulatory Visit (HOSPITAL_COMMUNITY): Payer: Self-pay

## 2023-04-10 ENCOUNTER — Other Ambulatory Visit: Payer: Self-pay

## 2023-04-23 NOTE — Progress Notes (Signed)
The 10-year ASCVD risk score (Arnett DK, et al., 2019) is: 0.5%   Values used to calculate the score:     Age: 47 years     Sex: Female     Is Non-Hispanic African American: No     Diabetic: No     Tobacco smoker: No     Systolic Blood Pressure: 96 mmHg     Is BP treated: No     HDL Cholesterol: 56 mg/dL     Total Cholesterol: 221 mg/dL  Sandie Ano, RN

## 2023-05-06 ENCOUNTER — Emergency Department (HOSPITAL_COMMUNITY)
Admission: EM | Admit: 2023-05-06 | Discharge: 2023-05-06 | Disposition: A | Discharge status: Home / Self Care | Payer: Self-pay | Attending: Emergency Medicine | Admitting: Emergency Medicine

## 2023-05-06 ENCOUNTER — Other Ambulatory Visit: Payer: Self-pay

## 2023-05-06 ENCOUNTER — Encounter (HOSPITAL_COMMUNITY): Payer: Self-pay

## 2023-05-06 ENCOUNTER — Ambulatory Visit (HOSPITAL_COMMUNITY): Admission: RE | Admit: 2023-05-06 | Payer: Self-pay | Source: Ambulatory Visit

## 2023-05-06 DIAGNOSIS — L03116 Cellulitis of left lower limb: Secondary | ICD-10-CM | POA: Insufficient documentation

## 2023-05-06 MED ORDER — CEPHALEXIN 500 MG PO CAPS: 500.0000 mg | ORAL_CAPSULE | Freq: Four times a day (QID) | ORAL | 0 refills | Status: AC

## 2023-05-06 MED ORDER — CEPHALEXIN 500 MG PO CAPS
500.0000 mg | ORAL_CAPSULE | Freq: Once | ORAL | Status: AC
  Administered 2023-05-06: 500 mg via ORAL
  Filled 2023-05-06: qty 1

## 2023-05-06 NOTE — ED Triage Notes (Signed)
Patient reports left leg pain from inside of thigh down to knee for about four days. States she put all of her weight on it at work and it started hurting after that. Denies any other injury. Appears red, swollen, and is warm to touch.

## 2023-05-06 NOTE — ED Notes (Signed)
Pt is ambulatory to triage room from lobby w/out assistance. 

## 2023-05-06 NOTE — ED Provider Notes (Signed)
[place] EMERGENCY DEPARTMENT AT [facility] Provider Note   CSN: [id number] Arrival date & time: 05/06/23  0013     History  Chief Complaint  Patient presents with   Leg Pain    Kathleen Savage is a 47 y.o. female.  The history is provided by the patient.  Leg Pain Kathleen Savage is a 47 y.o. female who presents to the Emergency Department complaining of leg pain.  She presents to the emergency department for evaluation of swelling and pain to her left thigh that started on Saturday.  No reported injuries or insect bites.  No associated fevers, chest pain, difficulty breathing, nausea, vomiting.  She has a history of sciatica and has baseline difficulty with that leg.  No associate abdominal pain.  She has well-controlled HIV, prediabetes.      Home Medications Prior to Admission medications   Medication Sig Start Date End Date Taking? Authorizing Provider  acetaminophen-codeine (TYLENOL #3) 300-30 MG tablet Take 1-2 tablets by mouth every 8 (eight) hours as needed for moderate pain. 02/24/23  Yes Claiborne Rigg, NP  bictegravir-emtricitabine-tenofovir AF (BIKTARVY) 50-200-25 MG TABS tablet Take 1 tablet by mouth daily. 06/25/22  Yes Cliffton Asters, MD  cephALEXin (KEFLEX) 500 MG capsule Take 1 capsule (500 mg total) by mouth 4 (four) times daily for 10 days. 05/06/23 05/16/23 Yes Tilden Fossa, MD  gabapentin (NEURONTIN) 300 MG capsule Take 1 capsule (300 mg total) by mouth 3 (three) times daily. 03/10/23  Yes Claiborne Rigg, NP  cyclobenzaprine (FLEXERIL) 10 MG tablet Take 1 tablet (10 mg total) by mouth at bedtime as needed for leg/back pain Patient not taking: Reported on 05/06/2023 12/29/22   Claiborne Rigg, NP  diclofenac Sodium (VOLTAREN) 1 % GEL Apply 4 g topically 4 (four) times daily. To left knee. Patient not taking: Reported on 05/06/2023 07/20/20   Antony Madura, PA-C  promethazine-dextromethorphan (PROMETHAZINE-DM) 6.25-15 MG/5ML syrup Take 5 mLs by  mouth 4 (four) times daily as needed for cough. Patient not taking: Reported on 05/06/2023 03/31/23   Claiborne Rigg, NP      Allergies    Patient has no known allergies.    Review of Systems   Review of Systems  All other systems reviewed and are negative.   Physical Exam Updated Vital Signs BP 118/71 (BP Location: Left Arm)   Pulse 64   Temp 97.7 F (36.5 C) (Oral)   Resp 18   Ht 5\' 1"  (1.549 m)   Wt 113.4 kg   SpO2 99%   BMI 47.24 kg/m  Physical Exam Vitals and nursing note reviewed.  Constitutional:      Appearance: She is well-developed.  HENT:     Head: Normocephalic and atraumatic.  Cardiovascular:     Rate and Rhythm: Normal rate and regular rhythm.     Heart sounds: No murmur heard. Pulmonary:     Effort: Pulmonary effort is normal. No respiratory distress.     Breath sounds: Normal breath sounds.  Abdominal:     Palpations: Abdomen is soft.     Tenderness: There is no abdominal tenderness. There is no guarding or rebound.  Musculoskeletal:     Comments: There is erythema over the mid medial thigh extending to the knee.  There is no tenderness over the knee and she is able to fully flex and extend at the knee.  There is no erythema in the groin/inguinal region.  There is no palpable lymphadenopathy.  No tenderness over the hip.  Skin:    General: Skin is warm and dry.  Neurological:     Mental Status: She is alert and oriented to person, place, and time.  Psychiatric:        Behavior: Behavior normal.     ED Results / Procedures / Treatments   Labs (all labs ordered are listed, but only abnormal results are displayed) Labs Reviewed - No data to display  EKG None  Radiology No results found.  Procedures Procedures    Medications Ordered in ED Medications  cephALEXin (KEFLEX) capsule 500 mg (has no administration in time range)    ED Course/ Medical Decision Making/ A&P                             Medical Decision  Making Risk Prescription drug management.   Patient here for evaluation of atraumatic left thigh pain.  There is no evidence of necrotizing soft tissue infection, abscess, septic joint.  Examination is concerning for cellulitis.  Will start on antibiotics.  No evidence of sepsis.  Will also obtain DVT study to rule out DVT given distribution of swelling and tenderness.  Patient without symptoms of PE.  She is otherwise at low risk for DVT.  Discussed with patient home care for cellulitis as well as need to return for DVT study as well as return precautions for progressive or new concerning symptoms.        Final Clinical Impression(s) / ED Diagnoses Final diagnoses:  Cellulitis of left lower extremity    Rx / DC Orders ED Discharge Orders          Ordered    cephALEXin (KEFLEX) 500 MG capsule  4 times daily        05/06/23 0429    VAS [place] LOWER EXTREMITY VENOUS (DVT)        05/06/23 0429              Tilden Fossa, MD 05/06/23 [phone]

## 2023-05-08 ENCOUNTER — Other Ambulatory Visit (HOSPITAL_COMMUNITY): Payer: Self-pay

## 2023-05-08 ENCOUNTER — Encounter (HOSPITAL_COMMUNITY): Payer: Self-pay

## 2023-05-08 ENCOUNTER — Ambulatory Visit (HOSPITAL_COMMUNITY)
Admission: RE | Admit: 2023-05-08 | Discharge: 2023-05-08 | Disposition: A | Discharge status: Home / Self Care | Payer: Self-pay | Source: Ambulatory Visit | Attending: Surgery | Admitting: Surgery

## 2023-05-08 ENCOUNTER — Ambulatory Visit (HOSPITAL_COMMUNITY)
Admission: RE | Admit: 2023-05-08 | Discharge: 2023-05-08 | Disposition: A | Discharge status: Home / Self Care | Payer: Self-pay | Source: Ambulatory Visit | Attending: Emergency Medicine | Admitting: Emergency Medicine

## 2023-05-08 VITALS — BP 122/76 | HR 75

## 2023-05-08 DIAGNOSIS — I82422 Acute embolism and thrombosis of left iliac vein: Secondary | ICD-10-CM | POA: Insufficient documentation

## 2023-05-08 DIAGNOSIS — M7989 Other specified soft tissue disorders: Secondary | ICD-10-CM

## 2023-05-08 MED ORDER — XARELTO VTE STARTER PACK 15 & 20 MG PO TBPK
ORAL_TABLET | ORAL | 0 refills | Status: DC
  Filled 2023-05-08: qty 51, 30d supply, fill #0

## 2023-05-08 NOTE — Patient Instructions (Signed)
-  Start rivaroxaban (Xarelto) 15 mg twice daily with food for 21 days followed by 20 mg daily with food. -I will call you with any updates on your patient assistance application.  -Dr. Estanislado Spire office will be reaching out to schedule your procedure to remove the clot for Tuesday.  -It is important to take your medication around the same time every day.  -Avoid NSAIDs like ibuprofen (Advil, Motrin) and naproxen (Aleve) as well as aspirin doses over 100 mg daily. -Tylenol (acetaminophen) is the preferred over the counter pain medication to lower the risk of bleeding. -Be sure to alert all of your health care providers that you are taking an anticoagulant prior to starting a new medication or having a procedure. -Monitor for signs and symptoms of bleeding (abnormal bruising, prolonged bleeding, nose bleeds, bleeding from gums, discolored urine, black tarry stools). If you have fallen and hit your head OR if your bleeding is severe or not stopping, seek emergency care.  -Go to the emergency room if emergent signs and symptoms of new clot occur (new or worse swelling and pain in an arm or leg, shortness of breath, chest pain, fast or irregular heartbeats, lightheadedness, dizziness, fainting, coughing up blood) or if you experience a significant color change (pale or blue) in the extremity that has the DVT.  -We recommend you wear compression stockings (20-30 mmHg) as long as you are having swelling or pain. Be sure to purchase the correct size and take them off at night.   If you have any questions or need to reschedule an appointment, please call [phone] [facility].  If you are having an emergency, call 911 or present to the nearest emergency room.   What is a DVT?  -Deep vein thrombosis (DVT) is a condition in which a blood clot forms in a vein of the deep venous system which can occur in the lower leg, thigh, pelvis, arm, or neck. This condition is serious and can be life-threatening if the clot  travels to the arteries of the lungs and causing a blockage (pulmonary embolism, PE). A DVT can also damage veins in the leg, which can lead to long-term venous disease, leg pain, swelling, discoloration, and ulcers or sores (post-thrombotic syndrome).  -Treatment may include taking an anticoagulant medication to prevent more clots from forming and the current clot from growing, wearing compression stockings, and/or surgical procedures to remove or dissolve the clot.

## 2023-05-08 NOTE — Progress Notes (Addendum)
DVT Clinic Note  Name: Kathleen Savage     MRN: [id number]     DOB: 1976/07/14     Sex: female  PCP: Claiborne Rigg, NP  Today's Visit: Visit Information: Initial Visit  Referred to DVT Clinic by: Dr. Madilyn Hook [facility])  Referred to CPP by: Dr. Myra Gianotti Reason for referral:  Chief Complaint  Patient presents with   DVT   HISTORY OF PRESENT ILLNESS: Kathleen Savage is a 47 y.o. female with PMH HIV, chronic back pain, who presents after diagnosis of DVT for medication management. Patient presented to the ED 05/06/23 complaining of leg pain. She was discharged with cephalexin for cellulitis and had an outpatient [place] study ordered to rule out DVT. She completed this today which was positive for acute DVT involving the left external iliac and common femoral veins and was referred to the DVT Clinic to start treatment. She is accompanied by her husband Donald Pore. She reports that her pain started on Sunday. She has been taking the prescribed antibiotic but has not seen any improvement in symptoms yet. Pain and swelling extend to her upper thigh. She has sciatica with bilateral leg pain at baseline so she thought the pain was related to that. She has no personal history of DVT but her father (now deceased) did have a DVT. She works at [employer] so she is on her feet all day. When she gets home she sits for most of the rest of the evening. This is not a new change in mobility as this has been her typical routine for the past >1 year. No recent injuries or prolonged travel.   Positive Thrombotic Risk Factors: Obesity Bleeding Risk Factors: None Present  Negative Thrombotic Risk Factors: Previous VTE, Recent surgery (within 3 months), Recent trauma (within 3 months), Recent admission to hospital with acute illness (within 3 months), Bed rest >72 hours within 3 months, Paralysis, paresis, or recent plaster cast immobilization of lower extremity, Central venous catheterization,  Pregnancy, Recent COVID diagnosis (within 3 months), Known thrombophilic condition, Sedentary journey lasting >8 hours within 4 weeks, Within 6 weeks postpartum, Recent cesarean section (within 3 months), Estrogen therapy, Testosterone therapy, Erythropoiesis-stimulating agent, Active cancer, Older age, Smoking  Rx Insurance Coverage: Uninsured Rx Affordability: Filled starter pack for $0 using one time savings card at [facility]. Had her fill out patient assistance paper work for [employer] which we will submit and follow the status of.  Preferred Pharmacy: Filled starter pack at [facility]. Refills will be sent to [facility] at [facility] pending results of patient assistance application.   Past Medical History:  Diagnosis Date   Glucose intolerance (pre-diabetes)    HIV infection (HCC)    Hyperlipidemia     No past surgical history on file.  Social History   Socioeconomic History   Marital status: Married    Spouse name: Not on file   Number of children: 2   Years of education: Not on file   Highest education level: 6th grade  Occupational History   Not on file  Tobacco Use   Smoking status: Never   Smokeless tobacco: Never  Vaping Use   Vaping Use: Never used  Substance and Sexual Activity   Alcohol use: Yes    Alcohol/week: 3.0 standard drinks of alcohol    Types: 1 Glasses of wine, 1 Cans of beer, 1 Shots of liquor per week    Comment: occasional   Drug use: No   Sexual  activity: Yes    Partners: Male    Birth control/protection: Condom    Comment: given condoms  Other Topics Concern   Not on file  Social History Narrative   Not on file   Social Determinants of Health   Financial Resource Strain: Low Risk  (03/30/2023)   Overall Financial Resource Strain (CARDIA)    Difficulty of Paying Living Expenses: Not very hard  Food Insecurity: No Food Insecurity (03/30/2023)   Hunger Vital Sign    Worried About Running Out of Food in the Last  Year: Never true    Ran Out of Food in the Last Year: Never true  Transportation Needs: No Transportation Needs (03/30/2023)   PRAPARE - Administrator, Civil Service (Medical): No    Lack of Transportation (Non-Medical): No  Physical Activity: Unknown (03/30/2023)   Exercise Vital Sign    Days of Exercise per Week: 0 days    Minutes of Exercise per Session: Not on file  Stress: No Stress Concern Present (03/30/2023)   [employer] of Occupational Health - Occupational Stress Questionnaire    Feeling of Stress : Only a little  Social Connections: Moderately Isolated (03/30/2023)   Social Connection and Isolation Panel [NHANES]    Frequency of Communication with Friends and Family: Three times a week    Frequency of Social Gatherings with Friends and Family: Twice a week    Attends Religious Services: Never    Database administrator or Organizations: No    Attends Engineer, structural: Not on file    Marital Status: Married  Catering manager Violence: Not on file    Family History  Problem Relation Age of Onset   Cancer Maternal Aunt        Breast Cancer   Breast cancer Maternal Aunt    Cancer Cousin        Breast cancer - maternal cousin   Breast cancer Cousin    Cancer Cousin        breast cancer   Breast cancer Cousin    Cancer Cousin        breast cancer   Cancer Maternal Aunt        stomach cancer   Breast cancer Maternal Aunt    Hypertension Mother    Stroke Father    Cancer Sister    Uterine cancer Sister     Allergies as of 05/08/2023   (No Known Allergies)    Current Outpatient Medications on File Prior to Encounter  Medication Sig Dispense Refill   bictegravir-emtricitabine-tenofovir AF (BIKTARVY) 50-200-25 MG TABS tablet Take 1 tablet by mouth daily. 30 tablet 11   cephALEXin (KEFLEX) 500 MG capsule Take 1 capsule (500 mg total) by mouth 4 (four) times daily for 10 days. 40 capsule 0   gabapentin (NEURONTIN) 300 MG capsule Take 1  capsule (300 mg total) by mouth 3 (three) times daily. 90 capsule 3   acetaminophen-codeine (TYLENOL #3) 300-30 MG tablet Take 1-2 tablets by mouth every 8 (eight) hours as needed for moderate pain. (Patient not taking: Reported on 05/08/2023) 60 tablet 0   cyclobenzaprine (FLEXERIL) 10 MG tablet Take 1 tablet (10 mg total) by mouth at bedtime as needed for leg/back pain (Patient not taking: Reported on 05/06/2023) 30 tablet 2   No current facility-administered medications on file prior to encounter.   REVIEW OF SYSTEMS:  Review of Systems  Respiratory:  Negative for shortness of breath.   Cardiovascular:  Positive for leg  swelling. Negative for chest pain and palpitations.  Musculoskeletal:  Positive for back pain and myalgias.  Neurological:  Negative for dizziness and tingling.   PHYSICAL EXAMINATION:  Vitals:   05/08/23 1526  BP: 122/76  Pulse: 75  SpO2: 99%   Physical Exam Vitals reviewed.  Cardiovascular:     Rate and Rhythm: Normal rate.  Pulmonary:     Effort: Pulmonary effort is normal.  Musculoskeletal:        General: Tenderness present.     Left lower leg: Edema present.  Skin:    Findings: Erythema present. No bruising.  Psychiatric:        Mood and Affect: Mood normal.        Behavior: Behavior normal.        Thought Content: Thought content normal.   Villalta Score for Post-Thrombotic Syndrome: Pain: Mild Cramps: Mild Heaviness: Moderate Paresthesia: Absent Pruritus: Mild Pretibial Edema: Mild Skin Induration: Absent Hyperpigmentation: Absent Redness: Mild Venous Ectasia: Absent Pain on calf compression: Absent Villalta Preliminary Score: 7 Is venous ulcer present?: No If venous ulcer is present and score is <15, then 15 points total are assigned: Absent Villalta Total Score: 7  LABS:  CBC     Component Value Date/Time   WBC 7.1 03/31/2023 1132   WBC 6.9 06/11/2022 0930   RBC 4.24 03/31/2023 1132   RBC 3.90 06/11/2022 0930   HGB 11.9  03/31/2023 1132   HCT 37.4 03/31/2023 1132   PLT 353 03/31/2023 1132   MCV 88 03/31/2023 1132   MCH 28.1 03/31/2023 1132   MCH 31.0 06/11/2022 0930   MCHC 31.8 03/31/2023 1132   MCHC 34.4 06/11/2022 0930   RDW 13.3 03/31/2023 1132   LYMPHSABS 2.1 03/31/2023 1132   MONOABS 0.4 01/06/2012 1022   EOSABS 0.2 03/31/2023 1132   BASOSABS 0.0 03/31/2023 1132    Hepatic Function      Component Value Date/Time   PROT 8.1 12/29/2022 1021   ALBUMIN 4.7 12/29/2022 1021   AST 21 12/29/2022 1021   ALT 26 12/29/2022 1021   ALKPHOS 69 12/29/2022 1021   BILITOT 0.3 12/29/2022 1021    Renal Function   Lab Results  Component Value Date   CREATININE 0.73 12/29/2022   CREATININE 0.61 06/11/2022   CREATININE 0.66 12/23/2021    CrCl cannot be calculated (Patient's most recent lab result is older than the maximum 21 days allowed.).   VVS Vascular Lab Studies:  05/08/23 VAS [place] LOWER EXTREMITY VENOUS LEFT (DVT) Summary:  RIGHT:  - No evidence of common femoral vein obstruction.    LEFT:  - Findings consistent with acute deep vein thrombosis involving the left  external iliac vein, and left common femoral vein. Thrombus located in the  left common femoral vein appears to be loosely attached.  - Findings consistent with acute superficial vein thrombosis involving the  left great saphenous vein junction to proximal calf.  - No cystic structure found in the popliteal fossa.   ASSESSMENT: Location of DVT: Left iliac vein, Left common femoral vein, Left superficial vein Cause of DVT: unprovoked per patient history and interview today. Will refer her to hematology for further work up and recommendations on duration of treatment.   Given involvement of the external iliac and common femoral veins, I consulted with Dr. Myra Gianotti who came to evaluate the patient for consideration of mechanical thrombectomy. See his note for details of his assessment and plan. Will start anticoagulation with Xarelto. She is  uninsured and her stated  income appears to qualify her for patient assistance for Xarelto but would not for Eliquis. Xarelto has no drug interactions with any of her other medications, and her renal function is appropriate for use. We discussed the importance of taking it with food.   PLAN: -Start rivaroxaban (Xarelto) 15 mg twice daily with food for 21 days followed by 20 mg daily with food. -Expected duration of therapy: Pending further work up. Therapy started on 05/08/23. -Patient educated on purpose, proper use and potential adverse effects of rivaroxaban (Xarelto). -Discussed importance of taking medication around the same time every day. -Advised patient of medications to avoid (NSAIDs, aspirin doses >100 mg daily). -Educated that Tylenol (acetaminophen) is the preferred analgesic to lower the risk of bleeding. -Advised patient to alert all providers of anticoagulation therapy prior to starting a new medication or having a procedure. -Emphasized importance of monitoring for signs and symptoms of bleeding (abnormal bruising, prolonged bleeding, nose bleeds, bleeding from gums, discolored urine, black tarry stools). -Educated patient to present to the ED if emergent signs and symptoms of new thrombosis occur. -Counseled patient to wear compression stockings daily, removing at night. -Filled a 30 day supply of Xarelto today during her visit for $0.  -Will submit patient assistance application for Xarelto and follow up with the patient when we hear back. I will send in refills at that time.   Follow up: Dr. Estanislado Spire office will be reaching out to schedule her thrombectomy next week. Referral to hematology placed. I'll be reaching out to her to follow up on the status of her patient assistance application for Xarelto to ensure continued medication access.   Pervis Hocking, PharmD, [place], CPP Deep Vein Thrombosis Clinic Clinical Pharmacist Practitioner Office: [phone]  I have evaluated  the patient's chart/imaging and refer this patient to the Clinical Pharmacist Practitioner for medication management. I have reviewed the CPP's documentation and agree with her assessment and plan. I was immediately available during the visit for questions and collaboration.   Durene Cal, MD

## 2023-05-08 NOTE — Consult Note (Signed)
DVT Clinic Note   Name: Kathleen Savage     MRN: [id number]     DOB: 01-22-1976     Sex: female  PCP: Claiborne Rigg, NP   Today's Visit: Visit Information: Initial Visit   Referred to DVT Clinic by: Dr. Madilyn Hook [facility])  Referred to CPP by: Dr. Myra Gianotti Reason for referral:     Chief Complaint  Patient presents with   DVT    HISTORY OF PRESENT ILLNESS: Kathleen Savage is a 47 y.o. female with PMH HIV, chronic back pain, who presents after diagnosis of DVT for medication management. Patient presented to the ED 05/06/23 complaining of leg pain. She was discharged with cephalexin for cellulitis and had an outpatient [place] study ordered to rule out DVT. She completed this today which was positive for acute DVT involving the left external iliac and common femoral veins and was referred to the DVT Clinic to start treatment. She is accompanied by her husband Donald Pore. She reports that her pain started on Sunday. She has been taking the prescribed antibiotic but has not seen any improvement in symptoms yet. Pain and swelling extend to her upper thigh. She has sciatica with bilateral leg pain at baseline so she thought the pain was related to that. She has no personal history of DVT but her father (now deceased) did have a DVT. She works at [employer] so she is on her feet all day. When she gets home she sits for most of the rest of the evening. This is not a new change in mobility as this has been her typical routine for the past >1 year. No recent injuries or prolonged travel.    Positive Thrombotic Risk Factors: Obesity Bleeding Risk Factors: None Present   Negative Thrombotic Risk Factors: Previous VTE, Recent surgery (within 3 months), Recent trauma (within 3 months), Recent admission to hospital with acute illness (within 3 months), Bed rest >72 hours within 3 months, Paralysis, paresis, or recent plaster cast immobilization of lower extremity, Central venous catheterization,  Pregnancy, Recent COVID diagnosis (within 3 months), Known thrombophilic condition, Sedentary journey lasting >8 hours within 4 weeks, Within 6 weeks postpartum, Recent cesarean section (within 3 months), Estrogen therapy, Testosterone therapy, Erythropoiesis-stimulating agent, Active cancer, Older age, Smoking   Rx Insurance Coverage: Uninsured Rx Affordability: Filled starter pack for $0 using one time savings card at [facility]. Had her fill out patient assistance paper work for [employer] which we will submit and follow the status of.  Preferred Pharmacy: Filled starter pack at [facility]. Refills will be sent to [facility] at [facility] pending results of patient assistance application.        Past Medical History:  Diagnosis Date   Glucose intolerance (pre-diabetes)     HIV infection (HCC)     Hyperlipidemia      No past surgical history on file.  Social History         Socioeconomic History   Marital status: Married      Spouse name: Not on file   Number of children: 2   Years of education: Not on file   Highest education level: 6th grade  Occupational History   Not on file  Tobacco Use   Smoking status: Never   Smokeless tobacco: Never  Vaping Use   Vaping Use: Never used  Substance and Sexual Activity   Alcohol use: Yes      Alcohol/week: 3.0 standard drinks of alcohol  Types: 1 Glasses of wine, 1 Cans of beer, 1 Shots of liquor per week      Comment: occasional   Drug use: No   Sexual activity: Yes      Partners: Male      Birth control/protection: Condom      Comment: given condoms  Other Topics Concern   Not on file  Social History Narrative   Not on file    Social Determinants of Health        Financial Resource Strain: Low Risk  (03/30/2023)    Overall Financial Resource Strain (CARDIA)     Difficulty of Paying Living Expenses: Not very hard  Food Insecurity: No Food Insecurity (03/30/2023)    Hunger Vital Sign      Worried About Running Out of Food in the Last Year: Never true     Ran Out of Food in the Last Year: Never true  Transportation Needs: No Transportation Needs (03/30/2023)    PRAPARE - Therapist, art (Medical): No     Lack of Transportation (Non-Medical): No  Physical Activity: Unknown (03/30/2023)    Exercise Vital Sign     Days of Exercise per Week: 0 days     Minutes of Exercise per Session: Not on file  Stress: No Stress Concern Present (03/30/2023)    [employer] of Occupational Health - Occupational Stress Questionnaire     Feeling of Stress : Only a little  Social Connections: Moderately Isolated (03/30/2023)    Social Connection and Isolation Panel [NHANES]     Frequency of Communication with Friends and Family: Three times a week     Frequency of Social Gatherings with Friends and Family: Twice a week     Attends Religious Services: Never     Database administrator or Organizations: No     Attends Engineer, structural: Not on file     Marital Status: Married  Catering manager Violence: Not on file         Family History  Problem Relation Age of Onset   Cancer Maternal Aunt          Breast Cancer   Breast cancer Maternal Aunt     Cancer Cousin          Breast cancer - maternal cousin   Breast cancer Cousin     Cancer Cousin          breast cancer   Breast cancer Cousin     Cancer Cousin          breast cancer   Cancer Maternal Aunt          stomach cancer   Breast cancer Maternal Aunt     Hypertension Mother     Stroke Father     Cancer Sister     Uterine cancer Sister         Allergies as of 05/08/2023   (No Known Allergies)          Current Outpatient Medications on File Prior to Encounter  Medication Sig Dispense Refill   bictegravir-emtricitabine-tenofovir AF (BIKTARVY) 50-200-25 MG TABS tablet Take 1 tablet by mouth daily. 30 tablet 11   cephALEXin (KEFLEX) 500 MG capsule Take 1 capsule (500 mg total) by mouth 4  (four) times daily for 10 days. 40 capsule 0   gabapentin (NEURONTIN) 300 MG capsule Take 1 capsule (300 mg total) by mouth 3 (three) times daily. 90 capsule 3   acetaminophen-codeine (  TYLENOL #3) 300-30 MG tablet Take 1-2 tablets by mouth every 8 (eight) hours as needed for moderate pain. (Patient not taking: Reported on 05/08/2023) 60 tablet 0   cyclobenzaprine (FLEXERIL) 10 MG tablet Take 1 tablet (10 mg total) by mouth at bedtime as needed for leg/back pain (Patient not taking: Reported on 05/06/2023) 30 tablet 2    No current facility-administered medications on file prior to encounter.    REVIEW OF SYSTEMS:  Review of Systems  Respiratory:  Negative for shortness of breath.   Cardiovascular:  Positive for leg swelling. Negative for chest pain and palpitations.  Musculoskeletal:  Positive for back pain and myalgias.  Neurological:  Negative for dizziness and tingling.    PHYSICAL EXAMINATION:     Vitals:    05/08/23 1526  BP: 122/76  Pulse: 75  SpO2: 99%    Physical Exam Vitals reviewed.  Cardiovascular:     Rate and Rhythm: Normal rate.  Pulmonary:     Effort: Pulmonary effort is normal.  Musculoskeletal:        General: Tenderness present.     Left lower leg: Edema present.  Skin:    Findings: Erythema present. No bruising.  Psychiatric:        Mood and Affect: Mood normal.        Behavior: Behavior normal.        Thought Content: Thought content normal.    Villalta Score for Post-Thrombotic Syndrome: Pain: Mild Cramps: Mild Heaviness: Moderate Paresthesia: Absent Pruritus: Mild Pretibial Edema: Mild Skin Induration: Absent Hyperpigmentation: Absent Redness: Mild Venous Ectasia: Absent Pain on calf compression: Absent Villalta Preliminary Score: 7 Is venous ulcer present?: No If venous ulcer is present and score is <15, then 15 points total are assigned: Absent Villalta Total Score: 7   LABS:  CBC  Labs (Brief)          Component Value Date/Time     WBC 7.1 03/31/2023 1132    WBC 6.9 06/11/2022 0930    RBC 4.24 03/31/2023 1132    RBC 3.90 06/11/2022 0930    HGB 11.9 03/31/2023 1132    HCT 37.4 03/31/2023 1132    PLT 353 03/31/2023 1132    MCV 88 03/31/2023 1132    MCH 28.1 03/31/2023 1132    MCH 31.0 06/11/2022 0930    MCHC 31.8 03/31/2023 1132    MCHC 34.4 06/11/2022 0930    RDW 13.3 03/31/2023 1132    LYMPHSABS 2.1 03/31/2023 1132    MONOABS 0.4 01/06/2012 1022    EOSABS 0.2 03/31/2023 1132    BASOSABS 0.0 03/31/2023 1132      Hepatic Function   Labs (Brief)          Component Value Date/Time    PROT 8.1 12/29/2022 1021    ALBUMIN 4.7 12/29/2022 1021    AST 21 12/29/2022 1021    ALT 26 12/29/2022 1021    ALKPHOS 69 12/29/2022 1021    BILITOT 0.3 12/29/2022 1021      Renal Function   Recent Labs       Lab Results  Component Value Date    CREATININE 0.73 12/29/2022    CREATININE 0.61 06/11/2022    CREATININE 0.66 12/23/2021      CrCl cannot be calculated (Patient's most recent lab result is older than the maximum 21 days allowed.).    VVS Vascular Lab Studies:  05/08/23 VAS [place] LOWER EXTREMITY VENOUS LEFT (DVT) Summary:  RIGHT:  - No evidence of common femoral vein  obstruction.    LEFT:  - Findings consistent with acute deep vein thrombosis involving the left  external iliac vein, and left common femoral vein. Thrombus located in the  left common femoral vein appears to be loosely attached.  - Findings consistent with acute superficial vein thrombosis involving the  left great saphenous vein junction to proximal calf.  - No cystic structure found in the popliteal fossa.    ASSESSMENT: Location of DVT: Left iliac vein, Left common femoral vein, Left superficial vein Cause of DVT: unprovoked per patient history and interview today. Will refer her to hematology for further work up and recommendations on duration of treatment.    Given involvement of the external iliac and common femoral veins, I consulted  with Dr. Myra Gianotti who came to evaluate the patient for consideration of mechanical thrombectomy. See his note for details of his assessment and plan. Will start anticoagulation with Xarelto. She is uninsured and her stated income appears to qualify her for patient assistance for Xarelto but would not for Eliquis. Xarelto has no drug interactions with any of her other medications, and her renal function is appropriate for use. We discussed the importance of taking it with food.    PLAN: -Start rivaroxaban (Xarelto) 15 mg twice daily with food for 21 days followed by 20 mg daily with food. -Expected duration of therapy: Pending further work up. Therapy started on 05/08/23. -Patient educated on purpose, proper use and potential adverse effects of rivaroxaban (Xarelto). -Discussed importance of taking medication around the same time every day. -Advised patient of medications to avoid (NSAIDs, aspirin doses >100 mg daily). -Educated that Tylenol (acetaminophen) is the preferred analgesic to lower the risk of bleeding. -Advised patient to alert all providers of anticoagulation therapy prior to starting a new medication or having a procedure. -Emphasized importance of monitoring for signs and symptoms of bleeding (abnormal bruising, prolonged bleeding, nose bleeds, bleeding from gums, discolored urine, black tarry stools). -Educated patient to present to the ED if emergent signs and symptoms of new thrombosis occur. -Counseled patient to wear compression stockings daily, removing at night. -Filled a 30 day supply of Xarelto today during her visit for $0.  -Will submit patient assistance application for Xarelto and follow up with the patient when we hear back. I will send in refills at that time.    Follow up: Dr. Estanislado Spire office will be reaching out to schedule her thrombectomy next week. Referral to hematology placed. I'll be reaching out to her to follow up on the status of her patient assistance  application for Xarelto to ensure continued medication access.    Pervis Hocking, PharmD, [place]y [place], CPP [facility] Clinical Pharmacist Practitioner Office: [phone]  Addendum: This is a 47 year old female who presented to the emergency department on 05/06/2023 with left leg pain.  She was initially treated for cellulitis.  She came back for a DVT ultrasound which was positive for acute DVT in the left external iliac and common femoral vein.  She is having significant pain in her leg.  She has minimal swelling.  This appears to be a unprovoked DVT.  She has been started on anticoagulation.  I talked her about the possibility of thrombectomy.  This potentially could be related to May Thurner syndrome or may be hypercoagulability from her HIV.  She has agreed to proceed with mechanical thrombectomy which I will schedule for Tuesday.  My office will contact her on Monday to tell her what time to come.  The details  of the procedure were discussed with the patient.  She wishes to proceed  Durene Cal

## 2023-05-08 NOTE — H&P (View-Only) (Signed)
DVT Clinic Note   Name: Kathleen Savage     MRN: [id number]     DOB: 09/09/1976     Sex: female  PCP: Fleming, Zelda W, NP   Today's Visit: Visit Information: Initial Visit   Referred to DVT Clinic by: Dr. Rees ([facility])  Referred to CPP by: Dr. Akai Dollard Reason for referral:     Chief Complaint  Patient presents with   DVT    HISTORY OF PRESENT ILLNESS: Kathleen Savage is a 46 y.o. female with PMH HIV, chronic back pain, who presents after diagnosis of DVT for medication management. Patient presented to the ED 05/06/23 complaining of leg pain. She was discharged with cephalexin for cellulitis and had an outpatient US study ordered to rule out DVT. She completed this today which was positive for acute DVT involving the left external iliac and common femoral veins and was referred to the DVT Clinic to start treatment. She is accompanied by her husband Jorge. She reports that her pain started on Sunday. She has been taking the prescribed antibiotic but has not seen any improvement in symptoms yet. Pain and swelling extend to her upper thigh. She has sciatica with bilateral leg pain at baseline so she thought the pain was related to that. She has no personal history of DVT but her father (now deceased) did have a DVT. She works at [employer] so she is on her feet all day. When she gets home she sits for most of the rest of the evening. This is not a new change in mobility as this has been her typical routine for the past >1 year. No recent injuries or prolonged travel.    Positive Thrombotic Risk Factors: Obesity Bleeding Risk Factors: None Present   Negative Thrombotic Risk Factors: Previous VTE, Recent surgery (within 3 months), Recent trauma (within 3 months), Recent admission to hospital with acute illness (within 3 months), Bed rest >72 hours within 3 months, Paralysis, paresis, or recent plaster cast immobilization of lower extremity, Central venous catheterization,  Pregnancy, Recent COVID diagnosis (within 3 months), Known thrombophilic condition, Sedentary journey lasting >8 hours within 4 weeks, Within 6 weeks postpartum, Recent cesarean section (within 3 months), Estrogen therapy, Testosterone therapy, Erythropoiesis-stimulating agent, Active cancer, Older age, Smoking   Rx Insurance Coverage: Uninsured Rx Affordability: Filled starter pack for $0 using one time savings card at [facility]. Had her fill out patient assistance paper work for Xarelto which we will submit and follow the status of.  Preferred Pharmacy: Filled starter pack at [facility]. Refills will be sent to [facility] pending results of patient assistance application.        Past Medical History:  Diagnosis Date   Glucose intolerance (pre-diabetes)     HIV infection (HCC)     Hyperlipidemia      No past surgical history on file.  Social History         Socioeconomic History   Marital status: Married      Spouse name: Not on file   Number of children: 2   Years of education: Not on file   Highest education level: 6th grade  Occupational History   Not on file  Tobacco Use   Smoking status: Never   Smokeless tobacco: Never  Vaping Use   Vaping Use: Never used  Substance and Sexual Activity   Alcohol use: Yes      Alcohol/week: 3.0 standard drinks of alcohol        Types: 1 Glasses of wine, 1 Cans of beer, 1 Shots of liquor per week      Comment: occasional   Drug use: No   Sexual activity: Yes      Partners: Male      Birth control/protection: Condom      Comment: given condoms  Other Topics Concern   Not on file  Social History Narrative   Not on file    Social Determinants of Health        Financial Resource Strain: Low Risk  (03/30/2023)    Overall Financial Resource Strain (CARDIA)     Difficulty of Paying Living Expenses: Not very hard  Food Insecurity: No Food Insecurity (03/30/2023)    Hunger Vital Sign      Worried About Running Out of Food in the Last Year: Never true     Ran Out of Food in the Last Year: Never true  Transportation Needs: No Transportation Needs (03/30/2023)    PRAPARE - Transportation     Lack of Transportation (Medical): No     Lack of Transportation (Non-Medical): No  Physical Activity: Unknown (03/30/2023)    Exercise Vital Sign     Days of Exercise per Week: 0 days     Minutes of Exercise per Session: Not on file  Stress: No Stress Concern Present (03/30/2023)    [employer] - Occupational Stress Questionnaire     Feeling of Stress : Only a little  Social Connections: Moderately Isolated (03/30/2023)    Social Connection and Isolation Panel [NHANES]     Frequency of Communication with Friends and Family: Three times a week     Frequency of Social Gatherings with Friends and Family: Twice a week     Attends Religious Services: Never     Active Member of Clubs or Organizations: No     Attends Club or Organization Meetings: Not on file     Marital Status: Married  Intimate Partner Violence: Not on file         Family History  Problem Relation Age of Onset   Cancer Maternal Aunt          Breast Cancer   Breast cancer Maternal Aunt     Cancer Cousin          Breast cancer - maternal cousin   Breast cancer Cousin     Cancer Cousin          breast cancer   Breast cancer Cousin     Cancer Cousin          breast cancer   Cancer Maternal Aunt          stomach cancer   Breast cancer Maternal Aunt     Hypertension Mother     Stroke Father     Cancer Sister     Uterine cancer Sister         Allergies as of 05/08/2023   (No Known Allergies)          Current Outpatient Medications on File Prior to Encounter  Medication Sig Dispense Refill   bictegravir-emtricitabine-tenofovir AF (BIKTARVY) 50-200-25 MG TABS tablet Take 1 tablet by mouth daily. 30 tablet 11   cephALEXin (KEFLEX) 500 MG capsule Take 1 capsule (500 mg total) by mouth 4  (four) times daily for 10 days. 40 capsule 0   gabapentin (NEURONTIN) 300 MG capsule Take 1 capsule (300 mg total) by mouth 3 (three) times daily. 90 capsule 3   acetaminophen-codeine (  TYLENOL #3) 300-30 MG tablet Take 1-2 tablets by mouth every 8 (eight) hours as needed for moderate pain. (Patient not taking: Reported on 05/08/2023) 60 tablet 0   cyclobenzaprine (FLEXERIL) 10 MG tablet Take 1 tablet (10 mg total) by mouth at bedtime as needed for leg/back pain (Patient not taking: Reported on 05/06/2023) 30 tablet 2    No current facility-administered medications on file prior to encounter.    REVIEW OF SYSTEMS:  Review of Systems  Respiratory:  Negative for shortness of breath.   Cardiovascular:  Positive for leg swelling. Negative for chest pain and palpitations.  Musculoskeletal:  Positive for back pain and myalgias.  Neurological:  Negative for dizziness and tingling.    PHYSICAL EXAMINATION:     Vitals:    05/08/23 1526  BP: 122/76  Pulse: 75  SpO2: 99%    Physical Exam Vitals reviewed.  Cardiovascular:     Rate and Rhythm: Normal rate.  Pulmonary:     Effort: Pulmonary effort is normal.  Musculoskeletal:        General: Tenderness present.     Left lower leg: Edema present.  Skin:    Findings: Erythema present. No bruising.  Psychiatric:        Mood and Affect: Mood normal.        Behavior: Behavior normal.        Thought Content: Thought content normal.    Villalta Score for Post-Thrombotic Syndrome: Pain: Mild Cramps: Mild Heaviness: Moderate Paresthesia: Absent Pruritus: Mild Pretibial Edema: Mild Skin Induration: Absent Hyperpigmentation: Absent Redness: Mild Venous Ectasia: Absent Pain on calf compression: Absent Villalta Preliminary Score: 7 Is venous ulcer present?: No If venous ulcer is present and score is <15, then 15 points total are assigned: Absent Villalta Total Score: 7   LABS:  CBC  Labs (Brief)          Component Value Date/Time     WBC 7.1 03/31/2023 1132    WBC 6.9 06/11/2022 0930    RBC 4.24 03/31/2023 1132    RBC 3.90 06/11/2022 0930    HGB 11.9 03/31/2023 1132    HCT 37.4 03/31/2023 1132    PLT 353 03/31/2023 1132    MCV 88 03/31/2023 1132    MCH 28.1 03/31/2023 1132    MCH 31.0 06/11/2022 0930    MCHC 31.8 03/31/2023 1132    MCHC 34.4 06/11/2022 0930    RDW 13.3 03/31/2023 1132    LYMPHSABS 2.1 03/31/2023 1132    MONOABS 0.4 01/06/2012 1022    EOSABS 0.2 03/31/2023 1132    BASOSABS 0.0 03/31/2023 1132      Hepatic Function   Labs (Brief)          Component Value Date/Time    PROT 8.1 12/29/2022 1021    ALBUMIN 4.7 12/29/2022 1021    AST 21 12/29/2022 1021    ALT 26 12/29/2022 1021    ALKPHOS 69 12/29/2022 1021    BILITOT 0.3 12/29/2022 1021      Renal Function   Recent Labs       Lab Results  Component Value Date    CREATININE 0.73 12/29/2022    CREATININE 0.61 06/11/2022    CREATININE 0.66 12/23/2021      CrCl cannot be calculated (Patient's most recent lab result is older than the maximum 21 days allowed.).    VVS Vascular Lab Studies:  05/08/23 VAS US LOWER EXTREMITY VENOUS LEFT (DVT) Summary:  RIGHT:  - No evidence of common femoral vein   obstruction.    LEFT:  - Findings consistent with acute deep vein thrombosis involving the left  external iliac vein, and left common femoral vein. Thrombus located in the  left common femoral vein appears to be loosely attached.  - Findings consistent with acute superficial vein thrombosis involving the  left great saphenous vein junction to proximal calf.  - No cystic structure found in the popliteal fossa.    ASSESSMENT: Location of DVT: Left iliac vein, Left common femoral vein, Left superficial vein Cause of DVT: unprovoked per patient history and interview today. Will refer her to hematology for further work up and recommendations on duration of treatment.    Given involvement of the external iliac and common femoral veins, I consulted  with Dr. Dashanique Brownstein who came to evaluate the patient for consideration of mechanical thrombectomy. See his note for details of his assessment and plan. Will start anticoagulation with Xarelto. She is uninsured and her stated income appears to qualify her for patient assistance for Xarelto but would not for Eliquis. Xarelto has no drug interactions with any of her other medications, and her renal function is appropriate for use. We discussed the importance of taking it with food.    PLAN: -Start rivaroxaban (Xarelto) 15 mg twice daily with food for 21 days followed by 20 mg daily with food. -Expected duration of therapy: Pending further work up. Therapy started on 05/08/23. -Patient educated on purpose, proper use and potential adverse effects of rivaroxaban (Xarelto). -Discussed importance of taking medication around the same time every day. -Advised patient of medications to avoid (NSAIDs, aspirin doses >100 mg daily). -Educated that Tylenol (acetaminophen) is the preferred analgesic to lower the risk of bleeding. -Advised patient to alert all providers of anticoagulation therapy prior to starting a new medication or having a procedure. -Emphasized importance of monitoring for signs and symptoms of bleeding (abnormal bruising, prolonged bleeding, nose bleeds, bleeding from gums, discolored urine, black tarry stools). -Educated patient to present to the ED if emergent signs and symptoms of new thrombosis occur. -Counseled patient to wear compression stockings daily, removing at night. -Filled a 30 day supply of Xarelto today during her visit for $0.  -Will submit patient assistance application for Xarelto and follow up with the patient when we hear back. I will send in refills at that time.    Follow up: Dr. Keitra Carusone's office will be reaching out to schedule her thrombectomy next week. Referral to hematology placed. I'll be reaching out to her to follow up on the status of her patient assistance  application for Xarelto to ensure continued medication access.    Madison Yates, PharmD, BCACP, CPP [facility] Clinical Pharmacist Practitioner Office: [phone]  Addendum: This is a 46-year-old female who presented to the emergency department on 05/06/2023 with left leg pain.  She was initially treated for cellulitis.  She came back for a DVT ultrasound which was positive for acute DVT in the left external iliac and common femoral vein.  She is having significant pain in her leg.  She has minimal swelling.  This appears to be a unprovoked DVT.  She has been started on anticoagulation.  I talked her about the possibility of thrombectomy.  This potentially could be related to May Thurner syndrome or may be hypercoagulability from her HIV.  She has agreed to proceed with mechanical thrombectomy which I will schedule for Tuesday.  My office will contact her on Monday to tell her what time to come.  The details   of the procedure were discussed with the patient.  She wishes to proceed  Wells Ebenezer Mccaskey 

## 2023-05-11 ENCOUNTER — Other Ambulatory Visit: Payer: Self-pay

## 2023-05-11 DIAGNOSIS — I82422 Acute embolism and thrombosis of left iliac vein: Secondary | ICD-10-CM

## 2023-05-12 ENCOUNTER — Encounter (HOSPITAL_COMMUNITY)
Admission: RE | Disposition: A | Discharge status: Home / Self Care | Payer: Self-pay | Source: Home / Self Care | Attending: Surgery

## 2023-05-12 ENCOUNTER — Encounter: Payer: Self-pay | Admitting: Physical Medicine & Rehabilitation

## 2023-05-12 ENCOUNTER — Ambulatory Visit (HOSPITAL_COMMUNITY)
Admission: RE | Admit: 2023-05-12 | Discharge: 2023-05-12 | Disposition: A | Discharge status: Home / Self Care | Payer: Self-pay | Attending: Surgery | Admitting: Surgery

## 2023-05-12 DIAGNOSIS — I82412 Acute embolism and thrombosis of left femoral vein: Secondary | ICD-10-CM | POA: Insufficient documentation

## 2023-05-12 DIAGNOSIS — M549 Dorsalgia, unspecified: Secondary | ICD-10-CM | POA: Insufficient documentation

## 2023-05-12 DIAGNOSIS — Z21 Asymptomatic human immunodeficiency virus [HIV] infection status: Secondary | ICD-10-CM | POA: Insufficient documentation

## 2023-05-12 DIAGNOSIS — I82522 Chronic embolism and thrombosis of left iliac vein: Secondary | ICD-10-CM | POA: Insufficient documentation

## 2023-05-12 DIAGNOSIS — I82402 Acute embolism and thrombosis of unspecified deep veins of left lower extremity: Secondary | ICD-10-CM

## 2023-05-12 DIAGNOSIS — B20 Human immunodeficiency virus [HIV] disease: Secondary | ICD-10-CM | POA: Insufficient documentation

## 2023-05-12 DIAGNOSIS — I82422 Acute embolism and thrombosis of left iliac vein: Secondary | ICD-10-CM

## 2023-05-12 DIAGNOSIS — G8929 Other chronic pain: Secondary | ICD-10-CM | POA: Insufficient documentation

## 2023-05-12 HISTORY — PX: PERIPHERAL VASCULAR THROMBECTOMY: CATH118306

## 2023-05-12 HISTORY — PX: PERIPHERAL VASCULAR BALLOON ANGIOPLASTY: CATH118281

## 2023-05-12 HISTORY — PX: LOWER EXTREMITY VENOGRAPHY: CATH118253

## 2023-05-12 LAB — POCT I-STAT, CHEM 8
BUN: 12 mg/dL (ref 6–20)
Calcium, Ion: 1.18 mmol/L (ref 1.15–1.40)
Chloride: 104 mmol/L (ref 98–111)
Creatinine, Ser: 0.7 mg/dL (ref 0.44–1.00)
Glucose, Bld: 109 mg/dL — ABNORMAL HIGH (ref 70–99)
HCT: 41 % (ref 36.0–46.0)
Hemoglobin: 13.9 g/dL (ref 12.0–15.0)
Potassium: 4.2 mmol/L (ref 3.5–5.1)
Sodium: 139 mmol/L (ref 135–145)
TCO2: 24 mmol/L (ref 22–32)

## 2023-05-12 LAB — PREGNANCY, URINE: Preg Test, Ur: NEGATIVE

## 2023-05-12 SURGERY — PERIPHERAL VASCULAR THROMBECTOMY
Anesthesia: LOCAL

## 2023-05-12 MED ORDER — HEPARIN SODIUM (PORCINE) 1000 UNIT/ML IJ SOLN
INTRAMUSCULAR | Status: DC | PRN
  Administered 2023-05-12: 10000 [IU] via INTRAVENOUS

## 2023-05-12 MED ORDER — LABETALOL HCL 5 MG/ML IV SOLN: 10.0000 mg | INTRAVENOUS | Status: DC | PRN

## 2023-05-12 MED ORDER — IODIXANOL 320 MG/ML IV SOLN
INTRAVENOUS | Status: DC | PRN
  Administered 2023-05-12: 15 mL

## 2023-05-12 MED ORDER — OXYCODONE HCL 5 MG PO TABS: 5.0000 mg | ORAL_TABLET | ORAL | Status: DC | PRN

## 2023-05-12 MED ORDER — MIDAZOLAM HCL 2 MG/2ML IJ SOLN
INTRAMUSCULAR | Status: AC
  Filled 2023-05-12: qty 2

## 2023-05-12 MED ORDER — SODIUM CHLORIDE 0.9 % IV SOLN: INTRAVENOUS | Status: DC

## 2023-05-12 MED ORDER — ACETAMINOPHEN 325 MG PO TABS: 650.0000 mg | ORAL_TABLET | ORAL | Status: DC | PRN

## 2023-05-12 MED ORDER — LIDOCAINE HCL (PF) 1 % IJ SOLN
INTRAMUSCULAR | Status: AC
  Filled 2023-05-12: qty 30

## 2023-05-12 MED ORDER — LIDOCAINE HCL (PF) 1 % IJ SOLN
INTRAMUSCULAR | Status: DC | PRN
  Administered 2023-05-12: 10 mL

## 2023-05-12 MED ORDER — FENTANYL CITRATE (PF) 100 MCG/2ML IJ SOLN
INTRAMUSCULAR | Status: DC | PRN
  Administered 2023-05-12: 25 ug via INTRAVENOUS
  Administered 2023-05-12: 50 ug via INTRAVENOUS

## 2023-05-12 MED ORDER — SODIUM CHLORIDE 0.9 % WEIGHT BASED INFUSION: 1.0000 mL/kg/h | INTRAVENOUS | Status: DC

## 2023-05-12 MED ORDER — HYDRALAZINE HCL 20 MG/ML IJ SOLN: 5.0000 mg | INTRAMUSCULAR | Status: DC | PRN

## 2023-05-12 MED ORDER — MIDAZOLAM HCL 2 MG/2ML IJ SOLN
INTRAMUSCULAR | Status: DC | PRN
  Administered 2023-05-12: 1 mg via INTRAVENOUS
  Administered 2023-05-12: 2 mg via INTRAVENOUS

## 2023-05-12 MED ORDER — MORPHINE SULFATE (PF) 2 MG/ML IV SOLN: 2.0000 mg | INTRAVENOUS | Status: DC | PRN

## 2023-05-12 MED ORDER — HEPARIN SODIUM (PORCINE) 1000 UNIT/ML IJ SOLN
INTRAMUSCULAR | Status: AC
  Filled 2023-05-12: qty 10

## 2023-05-12 MED ORDER — SODIUM CHLORIDE 0.9% FLUSH: 3.0000 mL | Freq: Two times a day (BID) | INTRAVENOUS | Status: DC

## 2023-05-12 MED ORDER — FENTANYL CITRATE (PF) 100 MCG/2ML IJ SOLN
INTRAMUSCULAR | Status: AC
  Filled 2023-05-12: qty 2

## 2023-05-12 MED ORDER — HEPARIN (PORCINE) IN NACL 1000-0.9 UT/500ML-% IV SOLN
INTRAVENOUS | Status: DC | PRN
  Administered 2023-05-12: 500 mL

## 2023-05-12 MED ORDER — ONDANSETRON HCL 4 MG/2ML IJ SOLN: 4.0000 mg | Freq: Four times a day (QID) | INTRAMUSCULAR | Status: DC | PRN

## 2023-05-12 MED ORDER — SODIUM CHLORIDE 0.9% FLUSH: 3.0000 mL | INTRAVENOUS | Status: DC | PRN

## 2023-05-12 MED ORDER — SODIUM CHLORIDE 0.9 % IV SOLN: 250.0000 mL | INTRAVENOUS | Status: DC | PRN

## 2023-05-12 SURGICAL SUPPLY — 13 items
BALLN MUSTANG 12X80X75 (BALLOONS) ×3
BALLOON MUSTANG 12X80X75 (BALLOONS) IMPLANT
CATH CLOT TRIEVER BOLD (CATHETERS) IMPLANT
CATH VISIONS PV .035 IVUS (CATHETERS) IMPLANT
GLIDEWIRE ADV .035X260CM (WIRE) IMPLANT
KIT ENCORE 26 ADVANTAGE (KITS) IMPLANT
KIT MICROPUNCTURE NIT STIFF (SHEATH) IMPLANT
PROTECTION STATION PRESSURIZED (MISCELLANEOUS) ×3
SHEATH CLOT RETRIEVER (SHEATH) IMPLANT
SHEATH PINNACLE 8F 10CM (SHEATH) IMPLANT
SHEATH PROBE COVER 6X72 (BAG) IMPLANT
STATION PROTECTION PRESSURIZED (MISCELLANEOUS) IMPLANT
TRAY PV CATH (CUSTOM PROCEDURE TRAY) IMPLANT

## 2023-05-12 NOTE — Discharge Instructions (Addendum)
Take xarelto tonight Office to call for follow up Get compression stockings prior to d/c  Activity Rest as told by your health care provider. Return to your normal activities as told by your health care provider. Ask your health care provider what activities are safe for you. May shower and remove bandage 24 hours after discharge. If you were given a sedative during the procedure, it can affect you for several hours. Do not drive or operate machinery until your health care provider says that it is safe. General instructions      Check your IV insertion area every day for signs of infection. Check for: Redness, swelling, or pain. Fluid or blood. Warmth. Pus or a bad smell. Take over-the-counter and prescription medicines only as told by your health care provider. Contact a health care provider if: You have redness, swelling, warmth, pus, or pain around the IV insertion site.

## 2023-05-12 NOTE — Interval H&P Note (Signed)
History and Physical Interval Note:  05/12/2023 1:06 PM  Kathleen Savage  has presented today for surgery, with the diagnosis of DVT left leg.  The various methods of treatment have been discussed with the patient and family. After consideration of risks, benefits and other options for treatment, the patient has consented to  Procedure(s): MECHANICAL THROMBECTOMY (N/A) as a surgical intervention.  The patient's history has been reviewed, patient examined, no change in status, stable for surgery.  I have reviewed the patient's chart and labs.  Questions were answered to the patient's satisfaction.     Durene Cal

## 2023-05-12 NOTE — Op Note (Signed)
    Patient name: Kathleen Savage MRN: [id number] DOB: [birth year] Sex: female  05/12/2023 Pre-operative Diagnosis: Left leg DVT Post-operative diagnosis:  Same Surgeon:  Durene Cal Procedure Performed:  1.  Ultrasound-guided access, left popliteal vein  2.  Intravascular ultrasound (IVUS): Inferior vena cava, left common iliac, external iliac, common femoral, femoral, and popliteal vein  3.  Mechanical venous thrombectomy of the left common iliac, external iliac, common femoral, and femoral vein  4.  Balloon venoplasty of the left common iliac, external iliac, common femoral, and femoral vein  5.  Venogram from catheter in the popliteal vein with left leg imaging and venacavogram  6.  Conscious sedation, 47-minute    Indications: This is a 47 year old female who presented to the emergency department last week with left leg pain.  She was found to have a acute DVT in the iliac and femoral vein.  She comes in today for thrombectomy.  Procedure:  The patient was identified in the holding area and taken to room 8.  The patient was then placed supine on the table and prepped and draped in the usual sterile fashion.  A time out was called.  Conscious sedation was administered with the use of IV fentanyl and Versed under continuous physician and nurse monitoring.  Heart rate, blood pressure, and oxygen saturation were continuously monitored.  Total sedation time was 47 minutes.  Ultrasound was used to evaluate left popliteal vein which was patent and compressible.  1% lidocaine was used for local anesthesia.  The left popliteal vein was cannulated under ultrasound guidance with a micropuncture needle.  A 018 wire was inserted without resistance followed by placement of a micropuncture sheath.  Next, a Glidewire advantage was advanced into the left subclavian vein and an 8 French sheath was placed.  Intravascular ultrasound was used to evaluate the left popliteal, femoral, common femoral, external  iliac and common iliac veins as well as the inferior vena cava.  There did not appear to be any large thrombus burden.  Next, the Inari sheath was inserted.  The patient was fully heparinized.  The Clot Triever device was used to perform mechanical thrombectomy of the left common iliac, external iliac, common femoral, and femoral veins in 4 different directions.  No significant thrombus was evacuated.  IVUS was then used to evaluate her veins.  There was a possible narrowing in the common iliac vein.  I performed venography through the sheath in the popliteal vein that showed inline flow there was mild luminal narrowing in the external iliac and common femoral vein and possibly in the common iliac vein.  Therefore elected to perform balloon venoplasty using a 12 x 80 Mustang balloon.  This was performed in the common iliac, external iliac, common femoral and femoral vein.  I then reinserted the Inari device and perform 1 more pass without significant thrombus burden retrieved.  I elected to stop at this time.  The sheath was removed and the access site was closed with a Monocryl.  Sterile dressings were applied.   Impression:  #1  No significant thrombus burden was evacuated.  The Inari device was passed 5 times.  Balloon venoplasty with a 12 mm balloon was performed.  The patient has inline flow from the popliteal vein into the inferior vena cava    V. Durene Cal, M.D., [facility] [place] Office: [phone] Pager:  [phone]

## 2023-05-13 ENCOUNTER — Encounter (HOSPITAL_COMMUNITY): Payer: Self-pay | Admitting: Surgery

## 2023-05-13 ENCOUNTER — Other Ambulatory Visit (HOSPITAL_COMMUNITY): Payer: Self-pay

## 2023-05-13 ENCOUNTER — Encounter: Payer: Self-pay | Admitting: Pharmacist

## 2023-05-13 ENCOUNTER — Other Ambulatory Visit: Payer: Self-pay

## 2023-05-15 ENCOUNTER — Telehealth (HOSPITAL_COMMUNITY): Payer: Self-pay | Admitting: Licensed Clinical Social Worker

## 2023-05-15 NOTE — Telephone Encounter (Signed)
CSW received referral to assist patient who is uninsured. Patient states she has the application for the [facility] card although reports she does not have a SSN. CSW encouraged her to complete the [facility] card application as well as mailed her the [employer] application to complete. Patient verbalizes understanding and reports she has all the required documents to submit with the application. CSW stressed the importance of getting the application in for financial assistance. CSW available as needed. Lasandra Beech, LCSW, CCSW-MCS [phone]

## 2023-05-18 ENCOUNTER — Telehealth (HOSPITAL_COMMUNITY): Payer: Self-pay

## 2023-05-18 DIAGNOSIS — I82422 Acute embolism and thrombosis of left iliac vein: Secondary | ICD-10-CM

## 2023-05-18 NOTE — Telephone Encounter (Signed)
Patient Advocate Encounter  Application for Xarelto faxed to [employer] on 05/18/2023.  Burnell Blanks, CPhT Rx Patient Advocate Phone: [phone]

## 2023-05-19 ENCOUNTER — Other Ambulatory Visit: Payer: Self-pay

## 2023-05-25 NOTE — Telephone Encounter (Signed)
Patient Advocate Encounter  Contacted [employer] for status update. Patient was approved to receive Xarelto from [employer]. Effective 05/18/2023 to 05/17/2024  BIN [id number] ID [id number] GROUP [id number]

## 2023-05-31 ENCOUNTER — Other Ambulatory Visit: Payer: Self-pay | Admitting: Student-PharmD

## 2023-06-01 ENCOUNTER — Other Ambulatory Visit: Payer: Self-pay

## 2023-06-01 ENCOUNTER — Other Ambulatory Visit (HOSPITAL_COMMUNITY): Payer: Self-pay

## 2023-06-01 MED ORDER — RIVAROXABAN 20 MG PO TABS
20.0000 mg | ORAL_TABLET | Freq: Every day | ORAL | 1 refills | Status: DC
Start: 2023-06-01 — End: 2024-01-12
  Filled 2023-06-01: qty 30, 30d supply, fill #0
  Filled 2023-06-05: qty 90, 90d supply, fill #0
  Filled 2023-08-29: qty 90, 90d supply, fill #1

## 2023-06-01 NOTE — Addendum Note (Signed)
Addended by: Pervis Hocking B on: 06/01/2023 09:00 AM   Modules accepted: Orders

## 2023-06-01 NOTE — Telephone Encounter (Signed)
Confirmed patient's preferred pharmacy is [facility] for pick up. Refills for Xarelto have been sent there to be filled using PAP.

## 2023-06-01 NOTE — Telephone Encounter (Signed)
Starter pack only needs to be filled once. Sent Xarelto 20 mg refills instead.

## 2023-06-02 ENCOUNTER — Other Ambulatory Visit: Payer: Self-pay

## 2023-06-05 ENCOUNTER — Other Ambulatory Visit: Payer: Self-pay

## 2023-06-05 ENCOUNTER — Inpatient Hospital Stay: Payer: Self-pay

## 2023-06-05 ENCOUNTER — Other Ambulatory Visit (HOSPITAL_COMMUNITY): Payer: Self-pay

## 2023-06-05 ENCOUNTER — Inpatient Hospital Stay: Payer: Self-pay | Attending: Hematology and Oncology | Admitting: Hematology and Oncology

## 2023-06-05 VITALS — BP 129/74 | HR 88 | Temp 98.0°F | Resp 18 | Ht 61.0 in | Wt 249.1 lb

## 2023-06-05 DIAGNOSIS — M5459 Other low back pain: Secondary | ICD-10-CM | POA: Insufficient documentation

## 2023-06-05 DIAGNOSIS — Z8049 Family history of malignant neoplasm of other genital organs: Secondary | ICD-10-CM | POA: Insufficient documentation

## 2023-06-05 DIAGNOSIS — Z800 Family history of malignant neoplasm of digestive organs: Secondary | ICD-10-CM | POA: Insufficient documentation

## 2023-06-05 DIAGNOSIS — I82422 Acute embolism and thrombosis of left iliac vein: Secondary | ICD-10-CM | POA: Insufficient documentation

## 2023-06-05 DIAGNOSIS — Z803 Family history of malignant neoplasm of breast: Secondary | ICD-10-CM | POA: Insufficient documentation

## 2023-06-05 DIAGNOSIS — I82412 Acute embolism and thrombosis of left femoral vein: Secondary | ICD-10-CM | POA: Insufficient documentation

## 2023-06-05 DIAGNOSIS — Z7901 Long term (current) use of anticoagulants: Secondary | ICD-10-CM | POA: Insufficient documentation

## 2023-06-05 NOTE — Progress Notes (Signed)
[facility] CONSULT NOTE  Patient Care Team: Claiborne Rigg, NP as PCP - General (Nurse Practitioner) Cliffton Asters, MD (Inactive) as PCP - Infectious Diseases (Infectious Diseases)  CHIEF COMPLAINTS/PURPOSE OF CONSULTATION:  Acute DVT  ASSESSMENT & PLAN:   This is a very pleasant 47 year old female patient with no significant past medical history except HIV on Edwyna Shell, bilateral lower extremity pain which has continued to progress over the past 2 years and newly diagnosed acute DVT of the left lower extremity with no clear provoking factors referred to hematology for additional recommendations.  She is now on Xarelto for anticoagulation and tolerating it well.  No concerns on exam.  Left lower extremity swelling/asymmetry has resolved. No other concerns on physical exam.  Bilateral breast exam is also normal.  Given this unexplained lower back pain which has become progressive and unprovoked DVT, have recommended some imaging to look for occult malignancy.  Besides this we have also recommended hypercoagulable workup however we will do this when she is about to be done with her anticoagulation or immediately after her 6 months of anticoagulation.  If her hypercoagulable workup shows any underlying clotting predisposition, then we can consider long-term anticoagulation.  She is very much agreeable to this plan.  All her questions were answered to the best my knowledge.  Thank you for consulting [place] in the care of this patient. Please do not hesitate to contact us with any additional questions or concerns.  HISTORY OF PRESENTING ILLNESS:  Kathleen Savage 47 y.o. female is here because of acute DVT  This is a 47 year old female patient with past medical history significant for HIV on HAART managed by Dr. Bonnita Nasuti who has been having severe lower extremity pain for the past 2 years, she describes it as lower back pain which radiates to both lower extremities and it has gotten so bad  that she had to cut her work hours.  Other than this pain, she most recently had some redness and swelling of the left thigh hence went to the emergency room, initially was thought to have cellulitis but when she had a vascular ultrasound, she was found to have acute DVT involving the left external iliac vein, left common femoral vein, thrombus located in the left common femoral vein appears to be loosely attached.  Findings consistent with acute superficial vein thrombosis involving the left great saphenous vein junction to proximal calf.  She is now on Xarelto for anticoagulation and has been tolerating this very well.  Her dad had some history of thrombosis in his 66s but she is not quite sure.  She had 2 children never had any peripartum or postpartum DVT.  She denies any chest pain or shortness of breath.  She denies any provoking factors such as long distance travel, trauma, recent surgeries.  She is not on any hormone replacement or birth control.  Rest of the pertinent 10 point ROS reviewed and negative  REVIEW OF SYSTEMS:   Constitutional: Denies fevers, chills or abnormal night sweats Eyes: Denies blurriness of vision, double vision or watery eyes Ears, nose, mouth, throat, and face: Denies mucositis or sore throat Respiratory: Denies cough, dyspnea or wheezes Cardiovascular: Denies palpitation, chest discomfort or lower extremity swelling Gastrointestinal:  Denies nausea, heartburn or change in bowel habits Skin: Denies abnormal skin rashes Lymphatics: Denies new lymphadenopathy or easy bruising Neurological:Denies numbness, tingling or new weaknesses Behavioral/Psych: Mood is stable, no new changes  All other systems were reviewed with the patient and are negative.  MEDICAL HISTORY:  Past Medical History:  Diagnosis Date   Glucose intolerance (pre-diabetes)    HIV infection (HCC)    Hyperlipidemia     SURGICAL HISTORY: Past Surgical History:  Procedure Laterality Date   LOWER  EXTREMITY VENOGRAPHY Left 05/12/2023   Procedure: LOWER EXTREMITY VENOGRAPHY;  Surgeon: Nada Libman, MD;  Location: [facility];  Service: Cardiovascular;  Laterality: Left;   PERIPHERAL VASCULAR BALLOON ANGIOPLASTY  05/12/2023   Procedure: PERIPHERAL VASCULAR BALLOON ANGIOPLASTY;  Surgeon: Nada Libman, MD;  Location: [facility];  Service: Cardiovascular;;   PERIPHERAL VASCULAR THROMBECTOMY N/A 05/12/2023   Procedure: MECHANICAL THROMBECTOMY;  Surgeon: Nada Libman, MD;  Location: [facility];  Service: Cardiovascular;  Laterality: N/A;    SOCIAL HISTORY: Social History   Socioeconomic History   Marital status: Married    Spouse name: Not on file   Number of children: 2   Years of education: Not on file   Highest education level: 6th grade  Occupational History   Not on file  Tobacco Use   Smoking status: Never   Smokeless tobacco: Never  Vaping Use   Vaping status: Never Used  Substance and Sexual Activity   Alcohol use: Yes    Alcohol/week: 3.0 standard drinks of alcohol    Types: 1 Glasses of wine, 1 Cans of beer, 1 Shots of liquor per week    Comment: occasional   Drug use: No   Sexual activity: Yes    Partners: Male    Birth control/protection: Condom    Comment: given condoms  Other Topics Concern   Not on file  Social History Narrative   Not on file   Social Determinants of Health   Financial Resource Strain: Low Risk  (03/30/2023)   Overall Financial Resource Strain (CARDIA)    Difficulty of Paying Living Expenses: Not very hard  Food Insecurity: No Food Insecurity (03/30/2023)   Hunger Vital Sign    Worried About Running Out of Food in the Last Year: Never true    Ran Out of Food in the Last Year: Never true  Transportation Needs: No Transportation Needs (03/30/2023)   PRAPARE - Administrator, Civil Service (Medical): No    Lack of Transportation (Non-Medical): No  Physical Activity: Unknown (03/30/2023)   Exercise  Vital Sign    Days of Exercise per Week: 0 days    Minutes of Exercise per Session: Not on file  Stress: No Stress Concern Present (03/30/2023)   [employer] of Occupational Health - Occupational Stress Questionnaire    Feeling of Stress : Only a little  Social Connections: Moderately Isolated (03/30/2023)   Social Connection and Isolation Panel [NHANES]    Frequency of Communication with Friends and Family: Three times a week    Frequency of Social Gatherings with Friends and Family: Twice a week    Attends Religious Services: Never    Database administrator or Organizations: No    Attends Engineer, structural: Not on file    Marital Status: Married  Catering manager Violence: Not on file    FAMILY HISTORY: Family History  Problem Relation Age of Onset   Cancer Maternal Aunt        Breast Cancer   Breast cancer Maternal Aunt    Cancer Cousin        Breast cancer - maternal cousin   Breast cancer Cousin    Cancer Cousin  breast cancer   Breast cancer Cousin    Cancer Cousin        breast cancer   Cancer Maternal Aunt        stomach cancer   Breast cancer Maternal Aunt    Hypertension Mother    Stroke Father    Cancer Sister    Uterine cancer Sister     ALLERGIES:  has no allergies on file.  MEDICATIONS:  Current Outpatient Medications  Medication Sig Dispense Refill   acetaminophen-codeine (TYLENOL #3) 300-30 MG tablet Take 1-2 tablets by mouth every 8 (eight) hours as needed for moderate pain. (Patient not taking: Reported on 05/08/2023) 60 tablet 0   bictegravir-emtricitabine-tenofovir AF (BIKTARVY) 50-200-25 MG TABS tablet Take 1 tablet by mouth daily. 30 tablet 11   cyclobenzaprine (FLEXERIL) 10 MG tablet Take 1 tablet (10 mg total) by mouth at bedtime as needed for leg/back pain (Patient not taking: Reported on 05/06/2023) 30 tablet 2   gabapentin (NEURONTIN) 300 MG capsule Take 1 capsule (300 mg total) by mouth 3 (three) times daily. 90 capsule 3    rivaroxaban (XARELTO) 20 MG TABS tablet Take 1 tablet (20 mg total) by mouth daily with supper. Take with food. 90 tablet 1   No current facility-administered medications for this visit.     PHYSICAL EXAMINATION: ECOG PERFORMANCE STATUS: 0 - Asymptomatic  Vitals:   06/05/23 1051  BP: 129/74  Pulse: 88  Resp: 18  Temp: 98 F (36.7 C)  SpO2: 98%   Filed Weights   06/05/23 1051  Weight: 249 lb 1.6 oz (113 kg)    GENERAL:alert, no distress and comfortable SKIN: skin color, texture, turgor are normal, no rashes or significant lesions EYES: normal, conjunctiva are pink and non-injected, sclera clear OROPHARYNX:no exudate, no erythema and lips, buccal mucosa, and tongue normal  NECK: supple, thyroid normal size, non-tender, without nodularity LYMPH:  no palpable lymphadenopathy in the cervical, axillary  LUNGS: clear to auscultation and percussion with normal breathing effort HEART: regular rate & rhythm and no murmurs and no lower extremity edema ABDOMEN:abdomen soft, non-tender and normal bowel sounds Musculoskeletal:no cyanosis of digits and no clubbing  PSYCH: alert & oriented x 3 with fluent speech NEURO: no focal motor/sensory deficits Left lower extremity swelling/redness has completely resolved.  There is really no asymmetry on exam today.  LABORATORY DATA:  I have reviewed the data as listed Lab Results  Component Value Date   WBC 7.1 03/31/2023   HGB 13.9 05/12/2023   HCT 41.0 05/12/2023   MCV 88 03/31/2023   PLT 353 03/31/2023     Chemistry      Component Value Date/Time   NA 139 05/12/2023 1314   NA 138 12/29/2022 1021   K 4.2 05/12/2023 1314   CL 104 05/12/2023 1314   CO2 21 12/29/2022 1021   BUN 12 05/12/2023 1314   BUN 11 12/29/2022 1021   CREATININE 0.70 05/12/2023 1314   CREATININE 0.61 06/11/2022 0930      Component Value Date/Time   CALCIUM 8.9 12/29/2022 1021   ALKPHOS 69 12/29/2022 1021   AST 21 12/29/2022 1021   ALT 26 12/29/2022 1021    BILITOT 0.3 12/29/2022 1021       RADIOGRAPHIC STUDIES: I have personally reviewed the radiological images as listed and agreed with the findings in the report. PERIPHERAL VASCULAR CATHETERIZATION  Result Date: 05/12/2023 Images from the original result were not included. Patient name: Jeaneen Pyka MRN: [id number] DOB: 07-09-1976 Sex: female 05/12/2023 Pre-operative  Diagnosis: Left leg DVT Post-operative diagnosis:  Same Surgeon:  Durene Cal Procedure Performed:  1.  Ultrasound-guided access, left popliteal vein  2.  Intravascular ultrasound (IVUS): Inferior vena cava, left common iliac, external iliac, common femoral, femoral, and popliteal vein  3.  Mechanical venous thrombectomy of the left common iliac, external iliac, common femoral, and femoral vein  4.  Balloon venoplasty of the left common iliac, external iliac, common femoral, and femoral vein  5.  Venogram from catheter in the popliteal vein with left leg imaging and venacavogram  6.  Conscious sedation, 47-minute Indications: This is a 47 year old female who presented to the emergency department last week with left leg pain.  She was found to have a acute DVT in the iliac and femoral vein.  She comes in today for thrombectomy. Procedure:  The patient was identified in the holding area and taken to room 8.  The patient was then placed supine on the table and prepped and draped in the usual sterile fashion.  A time out was called.  Conscious sedation was administered with the use of IV fentanyl and Versed under continuous physician and nurse monitoring.  Heart rate, blood pressure, and oxygen saturation were continuously monitored.  Total sedation time was 47 minutes.  Ultrasound was used to evaluate left popliteal vein which was patent and compressible.  1% lidocaine was used for local anesthesia.  The left popliteal vein was cannulated under ultrasound guidance with a micropuncture needle.  A 018 wire was inserted without resistance  followed by placement of a micropuncture sheath.  Next, a Glidewire advantage was advanced into the left subclavian vein and an 8 French sheath was placed.  Intravascular ultrasound was used to evaluate the left popliteal, femoral, common femoral, external iliac and common iliac veins as well as the inferior vena cava.  There did not appear to be any large thrombus burden.  Next, the Inari sheath was inserted.  The patient was fully heparinized.  The Clot Triever device was used to perform mechanical thrombectomy of the left common iliac, external iliac, common femoral, and femoral veins in 4 different directions.  No significant thrombus was evacuated.  IVUS was then used to evaluate her veins.  There was a possible narrowing in the common iliac vein.  I performed venography through the sheath in the popliteal vein that showed inline flow there was mild luminal narrowing in the external iliac and common femoral vein and possibly in the common iliac vein.  Therefore elected to perform balloon venoplasty using a 12 x 80 Mustang balloon.  This was performed in the common iliac, external iliac, common femoral and femoral vein.  I then reinserted the Inari device and perform 1 more pass without significant thrombus burden retrieved.  I elected to stop at this time.  The sheath was removed and the access site was closed with a Monocryl.  Sterile dressings were applied. Impression:  #1  No significant thrombus burden was evacuated.  The Inari device was passed 5 times.  Balloon venoplasty with a 12 mm balloon was performed.  The patient has inline flow from the popliteal vein into the inferior vena cava V. Durene Cal, M.D., [facility] [place] Office: [phone] Pager:  [phone]   VAS [place] LOWER EXTREMITY VENOUS (DVT)  Result Date: 05/08/2023  Lower Venous DVT Study Patient Name:  MERCEDA RIESGO  Date of Exam:   05/08/2023 Medical Rec #: [id number]  Accession #:     [id number] Date of Birth: 03-20-1976           Patient Gender: F Patient Age:   69 years Exam Location:  [facility] Procedure:      VAS [place] LOWER EXTREMITY VENOUS (DVT) Referring Phys: ELIZABETH REES --------------------------------------------------------------------------------  Indications: Pain and swelling in right thigh.  Comparison Study: No previous study. Performing Technologist: McKayla Maag RVT, [place]  Examination Guidelines: A complete evaluation includes B-mode imaging, spectral Doppler, color Doppler, and power Doppler as needed of all accessible portions of each vessel. Bilateral testing is considered an integral part of a complete examination. Limited examinations for reoccurring indications may be performed as noted. The reflux portion of the exam is performed with the patient in reverse Trendelenburg.  +-----+---------------+---------+-----------+----------+--------------+ RIGHTCompressibilityPhasicitySpontaneityPropertiesThrombus Aging +-----+---------------+---------+-----------+----------+--------------+ CFV  Full           Yes      Yes                                 +-----+---------------+---------+-----------+----------+--------------+ SFJ  Full                                                        +-----+---------------+---------+-----------+----------+--------------+   +--------+---------------+---------+-----------+----------+--------------------+ LEFT    CompressibilityPhasicitySpontaneityPropertiesThrombus Aging       +--------+---------------+---------+-----------+----------+--------------------+ CFV     Partial        Yes      Yes                  Acute                +--------+---------------+---------+-----------+----------+--------------------+ SFJ     None           No       No                   Acute                +--------+---------------+---------+-----------+----------+--------------------+ FV Prox Full                                                               +--------+---------------+---------+-----------+----------+--------------------+ FV Mid                 Yes      Yes                  Patent by color                                                           doppler              +--------+---------------+---------+-----------+----------+--------------------+ FV                     Yes      Yes  Patent by color      Distal                                               doppler              +--------+---------------+---------+-----------+----------+--------------------+ PFV     Full                                                              +--------+---------------+---------+-----------+----------+--------------------+ POP     Full           Yes      Yes                                       +--------+---------------+---------+-----------+----------+--------------------+ PTV     Full                                                              +--------+---------------+---------+-----------+----------+--------------------+ PERO    Full                                                              +--------+---------------+---------+-----------+----------+--------------------+ GSV     None           No       No                   Acute                +--------+---------------+---------+-----------+----------+--------------------+ EIV     Partial        Yes      Yes                  Acute                +--------+---------------+---------+-----------+----------+--------------------+ CIV     Full                                                              +--------+---------------+---------+-----------+----------+--------------------+     Summary: RIGHT: - No evidence of common femoral vein obstruction.  LEFT: - Findings consistent with acute deep vein thrombosis involving the left external iliac vein, and left common femoral vein.  Thrombus located in the left common femoral vein appears to be loosely attached. - Findings consistent with acute superficial vein thrombosis involving the left great saphenous vein junction to proximal calf. - No cystic structure found in the popliteal fossa.  *See table(s) above for measurements and observations.  Electronically signed by Coral Else MD on 05/08/2023 at 10:43:27 PM.    Final     All questions were answered. The patient knows to call the clinic with any problems, questions or concerns. I spent 45 minutes in the care of this patient including H and P, review of records, counseling and coordination of care.     Rachel Moulds, MD 06/05/2023 11:07 AM

## 2023-06-10 ENCOUNTER — Other Ambulatory Visit: Payer: Self-pay | Admitting: *Deleted

## 2023-06-10 DIAGNOSIS — I82422 Acute embolism and thrombosis of left iliac vein: Secondary | ICD-10-CM

## 2023-06-12 ENCOUNTER — Other Ambulatory Visit: Payer: Self-pay

## 2023-06-12 DIAGNOSIS — Z113 Encounter for screening for infections with a predominantly sexual mode of transmission: Secondary | ICD-10-CM

## 2023-06-12 DIAGNOSIS — B20 Human immunodeficiency virus [HIV] disease: Secondary | ICD-10-CM

## 2023-06-16 ENCOUNTER — Other Ambulatory Visit: Payer: Self-pay

## 2023-06-16 DIAGNOSIS — Z113 Encounter for screening for infections with a predominantly sexual mode of transmission: Secondary | ICD-10-CM

## 2023-06-16 DIAGNOSIS — B20 Human immunodeficiency virus [HIV] disease: Secondary | ICD-10-CM

## 2023-06-16 LAB — CBC WITH DIFFERENTIAL/PLATELET
Absolute Monocytes: 535 cells/uL (ref 200–950)
Basophils Absolute: 53 cells/uL (ref 0–200)
Basophils Relative: 0.8 %
Eosinophils Absolute: 330 cells/uL (ref 15–500)
Eosinophils Relative: 5 %
HCT: 33.5 % — ABNORMAL LOW (ref 35.0–45.0)
Hemoglobin: 11.1 g/dL — ABNORMAL LOW (ref 11.7–15.5)
Lymphs Abs: 2277 cells/uL (ref 850–3900)
MCH: 29 pg (ref 27.0–33.0)
MCHC: 33.1 g/dL (ref 32.0–36.0)
MCV: 87.5 fL (ref 80.0–100.0)
MPV: 10.2 fL (ref 7.5–12.5)
Monocytes Relative: 8.1 %
Neutro Abs: 3406 cells/uL (ref 1500–7800)
Neutrophils Relative %: 51.6 %
Platelets: 375 10*3/uL (ref 140–400)
RBC: 3.83 10*6/uL (ref 3.80–5.10)
RDW: 13.8 % (ref 11.0–15.0)
Total Lymphocyte: 34.5 %
WBC: 6.6 10*3/uL (ref 3.8–10.8)

## 2023-06-17 ENCOUNTER — Other Ambulatory Visit (HOSPITAL_BASED_OUTPATIENT_CLINIC_OR_DEPARTMENT_OTHER): Payer: Self-pay

## 2023-06-17 ENCOUNTER — Other Ambulatory Visit: Payer: Self-pay | Admitting: Nurse Practitioner

## 2023-06-17 DIAGNOSIS — G8929 Other chronic pain: Secondary | ICD-10-CM

## 2023-06-17 MED ORDER — ACETAMINOPHEN-CODEINE 300-30 MG PO TABS
1.0000 | ORAL_TABLET | Freq: Three times a day (TID) | ORAL | 0 refills | Status: DC | PRN
Start: 2023-06-17 — End: 2023-11-11
  Filled 2023-06-17: qty 60, 10d supply, fill #0

## 2023-06-18 ENCOUNTER — Other Ambulatory Visit: Payer: Self-pay

## 2023-06-18 ENCOUNTER — Encounter: Payer: Self-pay | Admitting: Physical Medicine and Rehabilitation

## 2023-06-19 ENCOUNTER — Other Ambulatory Visit: Payer: Self-pay

## 2023-06-19 ENCOUNTER — Other Ambulatory Visit (HOSPITAL_BASED_OUTPATIENT_CLINIC_OR_DEPARTMENT_OTHER): Payer: Self-pay

## 2023-06-30 ENCOUNTER — Other Ambulatory Visit: Payer: Self-pay

## 2023-06-30 ENCOUNTER — Ambulatory Visit: Payer: Self-pay | Admitting: Infectious Diseases

## 2023-06-30 ENCOUNTER — Encounter: Payer: Self-pay | Admitting: Infectious Diseases

## 2023-06-30 ENCOUNTER — Ambulatory Visit: Payer: Self-pay | Admitting: Internal Medicine

## 2023-06-30 VITALS — BP 143/88 | HR 82 | Temp 98.0°F | Ht 61.0 in | Wt 256.0 lb

## 2023-06-30 DIAGNOSIS — Z113 Encounter for screening for infections with a predominantly sexual mode of transmission: Secondary | ICD-10-CM

## 2023-06-30 DIAGNOSIS — Z5181 Encounter for therapeutic drug level monitoring: Secondary | ICD-10-CM

## 2023-06-30 DIAGNOSIS — Z0000 Encounter for general adult medical examination without abnormal findings: Secondary | ICD-10-CM

## 2023-06-30 DIAGNOSIS — B20 Human immunodeficiency virus [HIV] disease: Secondary | ICD-10-CM

## 2023-06-30 MED ORDER — BIKTARVY 50-200-25 MG PO TABS
1.0000 | ORAL_TABLET | Freq: Every day | ORAL | 11 refills | Status: DC
Start: 2023-06-30 — End: 2024-01-12

## 2023-06-30 NOTE — Progress Notes (Unsigned)
[address], [place], [place], [address]                                                                  Phn. [phone]; Fax: [phone]                                                                             Date: 06/30/23  Reason for Visit: HIV transferring care     HPI: Kathleen Savage is a 47 y.o.old female with a history of HIV well controlled on Biktarvy, prediabetes and HLD who is transferring care. Patient was previously seen by Dr Orvan Falconer for a long time.   Last seen on 06/2022. On Biktarvy Lab Results  Component Value Date   HIV1RNAQUANT Not Detected 06/16/2023   Lab Results  Component Value Date   CD4TABS 877 06/16/2023   CD4TABS 1,077 06/11/2022   CD4TABS 966 05/22/2020    Interval hx/current visit: Compliant with Biktarvy, may be missed one dose. Denies any issues with adherence of tx. Sexually active with husband. Drinks alcohol socially and denies smoking and IVDU. She works in Engineer, manufacturing systems. She follows with PCP for management of HLD and obesity. She is trying to loose weight. She had a mechanical venous thrombectomy of the left common iliac, external iliac, common femoral and femoral vein as well as ballon angioplasty.  She is taking rivaroxaban. She has no complaints today.   ROS: As stated in above HPI; all other systems were reviewed and are otherwise negative unless noted below  No reported fever / chills, night sweats, unintentional weight loss, acute visual change, odynophagia, chest pain/pressure, new or worsened SOB or WOB, nausea, vomiting, diarrhea, dysuria, GU discharge, syncope, seizures, red/hot swollen joints, hallucinations / delusions, rashes, new allergies, unusual / excessive bleeding, swollen lymph nodes  PMH/ PSH/ FamHx / Social Hx ,  medications and allergies reviewed and updated as appropriate; please see corresponding tab in EHR / prior notes                                        Current Outpatient Medications on File Prior to Visit  Medication Sig Dispense Refill   acetaminophen-codeine (TYLENOL #3) 300-30 MG tablet Take 1-2 tablets by mouth every 8 (eight) hours as needed for moderate pain. 60 tablet 0   gabapentin (NEURONTIN) 300 MG capsule Take 1 capsule (300 mg total) by mouth 3 (three) times daily. 90 capsule 3   rivaroxaban (XARELTO) 20 MG TABS tablet Take 1 tablet (20 mg total) by mouth daily with supper.  Take with food. 90 tablet 1   No current facility-administered medications on file prior to visit.   Not on File  Past Medical History:  Diagnosis Date   Glucose intolerance (pre-diabetes)    HIV infection (HCC)    Hyperlipidemia    Past Surgical History:  Procedure Laterality Date   LOWER EXTREMITY VENOGRAPHY Left 05/12/2023   Procedure: LOWER EXTREMITY VENOGRAPHY;  Surgeon: Nada Libman, MD;  [facility];  Service: Cardiovascular;  Laterality: Left;   PERIPHERAL VASCULAR BALLOON ANGIOPLASTY  05/12/2023   Procedure: PERIPHERAL VASCULAR BALLOON ANGIOPLASTY;  Surgeon: Nada Libman, MD;  [facility]: [facility];  Service: Cardiovascular;;   PERIPHERAL VASCULAR THROMBECTOMY N/A 05/12/2023   Procedure: MECHANICAL THROMBECTOMY;  Surgeon: Nada Libman, MD;  [facility];  Service: Cardiovascular;  Laterality: N/A;    Social History   Socioeconomic History   Marital status: Married    Spouse name: Not on file   Number of children: 2   Years of education: Not on file   Highest education level: 6th grade  Occupational History   Not on file  Tobacco Use   Smoking status: Never   Smokeless tobacco: Never  Vaping Use   Vaping status: Never Used  Substance and Sexual Activity   Alcohol use: Yes    Alcohol/week: 3.0 standard drinks of alcohol    Types: 1  Glasses of wine, 1 Cans of beer, 1 Shots of liquor per week    Comment: occasional   Drug use: No   Sexual activity: Yes    Partners: Male    Birth control/protection: Condom    Comment: given condoms  Other Topics Concern   Not on file  Social History Narrative   Not on file   Social Determinants of Health   Financial Resource Strain: Low Risk  (03/30/2023)   Overall Financial Resource Strain (CARDIA)    Difficulty of Paying Living Expenses: Not very hard  Food Insecurity: No Food Insecurity (03/30/2023)   Hunger Vital Sign    Worried About Running Out of Food in the Last Year: Never true    Ran Out of Food in the Last Year: Never true  Transportation Needs: No Transportation Needs (03/30/2023)   PRAPARE - Administrator, Civil Service (Medical): No    Lack of Transportation (Non-Medical): No  Physical Activity: Unknown (03/30/2023)   Exercise Vital Sign    Days of Exercise per Week: 0 days    Minutes of Exercise per Session: Not on file  Stress: No Stress Concern Present (03/30/2023)   [employer] of Occupational Health - Occupational Stress Questionnaire    Feeling of Stress : Only a little  Social Connections: Moderately Isolated (03/30/2023)   Social Connection and Isolation Panel [NHANES]    Frequency of Communication with Friends and Family: Three times a week    Frequency of Social Gatherings with Friends and Family: Twice a week    Attends Religious Services: Never    Database administrator or Organizations: No    Attends Engineer, structural: Not on file    Marital Status: Married  Catering manager Violence: Not on file   Family History  Problem Relation Age of Onset   Cancer Maternal Aunt        Breast Cancer   Breast cancer Maternal Aunt    Cancer Cousin        Breast cancer - maternal cousin   Breast cancer  Cousin    Cancer Cousin        breast cancer   Breast cancer Cousin    Cancer Cousin        breast cancer   Cancer Maternal  Aunt        stomach cancer   Breast cancer Maternal Aunt    Hypertension Mother    Stroke Father    Cancer Sister    Uterine cancer Sister     Vitals  Ht 5\' 1"  (1.549 m)   Wt 256 lb (116.1 kg)   BMI 48.37 kg/m    Examination  Gen: no acute distress, morbidly obese  HEENT: [place]/AT, no scleral icterus, no pale conjunctivae, hearing normal, oral mucosa moist Neck: Supple Cardio: Regular rate and rhythm Resp: Pulmonary effort normal in room air GI: nondistended GU: Musc: Extremities: No pedal edema Skin: No rashes Neuro: grossly non focal , awake, alert and oriented * 3  Psych: Calm, cooperative    Lab Results HIV 1 RNA Quant  Date Value  06/16/2023 Not Detected Copies/mL  06/11/2022 <20 Copies/mL (H)  05/22/2020 <20 DETECTED copies/mL (A)   HIV-1 RNA Viral Load (copies/mL)  Date Value  12/23/2021 <20   CD4 T Cell Abs (/uL)  Date Value  06/16/2023 877  06/11/2022 1,077  05/22/2020 966   No results found for: "HIV1GENOSEQ" Lab Results  Component Value Date   WBC 6.6 06/16/2023   HGB 11.1 (L) 06/16/2023   HCT 33.5 (L) 06/16/2023   MCV 87.5 06/16/2023   PLT 375 06/16/2023    Lab Results  Component Value Date   CREATININE 0.59 06/16/2023   BUN 17 06/16/2023   NA 136 06/16/2023   K 4.2 06/16/2023   CL 105 06/16/2023   CO2 23 06/16/2023   Lab Results  Component Value Date   ALT 20 06/16/2023   AST 14 06/16/2023   ALKPHOS 69 12/29/2022   BILITOT 0.2 06/16/2023    Lab Results  Component Value Date   CHOL 221 (H) 12/29/2022   TRIG 93 12/29/2022   HDL 56 12/29/2022   LDLCALC 149 (H) 12/29/2022   No results found for: "HAV" Lab Results  Component Value Date   HEPBSAG NEGATIVE 05/30/2014   HEPBSAB NEG 05/30/2014   Lab Results  Component Value Date   HCVAB NEGATIVE 05/30/2014   Lab Results  Component Value Date   CHLAMYDIAWP Negative 06/16/2023   N Negative 06/16/2023   No results found for: "GCPROBEAPT" No results found for:  "QUANTGOLD"    Health Maintenance: Immunization History  Administered Date(s) Administered   H1N1 11/28/2008   Hepatitis B 12/04/2003, 07/02/2004, 11/27/2004   Hepatitis B, ADULT 06/13/2014, 01/23/2015   Influenza Split 08/04/2011, 07/27/2012   Influenza Whole 09/08/2006, 09/14/2007, 08/03/2008, 10/23/2009, 09/10/2010   Influenza,inj,Quad PF,6+ Mos 11/29/2013, 01/23/2015, 08/09/2015, 07/22/2016, 07/28/2017, 09/14/2018, 12/23/2021, 12/29/2022   Meningococcal Mcv4o 01/27/2017, 09/14/2018   Pneumococcal Conjugate-13 09/14/2018   Pneumococcal Polysaccharide-23 10/10/2003, 11/28/2008   Tdap 02/12/2022    Assessment/Plan: # HIV Continue biktarvy 8/1 lab discussed  Fu in 6 months   # STD screening  No acute concerns  8/1 urine GC and RPR negative   # Hyperlipidemia/Morbid Obesity  Follows PCP Discussed including more fruits and vegetables as well as exercise regularly   # DVT of LLE  - on rivaroxaban  - Follows Hematology   # Immunization  COVID  Influenza 12/29/22 Monkeypox Pneumococcal PCV 13 09/2018, Pneumococcal PPV 09/2003 and 11/2008 Meningococcal Menveo 01/2017 and 09/2018 HepA check HAV ab  next visit  Hep B  06/13/2014 and 01/2015,check Hep B surface ab next visit  Tdap 02/12/2022 Zoster  #Health maintenance Hep C ab negative 2015 Hep N surface ag negative 05/2014 Papsmear /breast ca and colon ca screening - discussed and she follows with PCP Dental Care discussed, Dental referral placed today for [facility]. Information to schedule appointment completed today.   Patient's labs were reviewed as well as his previous records. Patients questions were addressed and answered. Safe sex counseling done.  I have personally spent 41 minutes involved in face-to-face and non-face-to-face activities for this patient on the day of the visit. Professional time spent includes the following activities: Preparing to see the patient (review of tests), Obtaining and/or  reviewing separately obtained history (admission/discharge record), Performing a medically appropriate examination and/or evaluation , Ordering medications/tests/procedures, referring and communicating with other health care professionals, Documenting clinical information in the EMR, Independently interpreting results (not separately reported), Communicating results to the patient/family/caregiver, Counseling and educating the patient/family/caregiver and Care coordination (not separately reported).    Electronically signed by:  Odette Fraction, MD Infectious Disease Physician [facility] [address] [place], [place] [address] Phone: [phone]  Fax: [phone]

## 2023-07-01 ENCOUNTER — Encounter: Payer: Self-pay | Admitting: Nurse Practitioner

## 2023-07-01 ENCOUNTER — Ambulatory Visit: Payer: Self-pay

## 2023-07-01 ENCOUNTER — Ambulatory Visit: Payer: Self-pay | Attending: Nurse Practitioner | Admitting: Nurse Practitioner

## 2023-07-01 ENCOUNTER — Other Ambulatory Visit: Payer: Self-pay

## 2023-07-01 VITALS — BP 113/75 | HR 77 | Ht 61.0 in | Wt 254.4 lb

## 2023-07-01 DIAGNOSIS — G8929 Other chronic pain: Secondary | ICD-10-CM

## 2023-07-01 DIAGNOSIS — Z113 Encounter for screening for infections with a predominantly sexual mode of transmission: Secondary | ICD-10-CM | POA: Insufficient documentation

## 2023-07-01 DIAGNOSIS — M545 Low back pain, unspecified: Secondary | ICD-10-CM

## 2023-07-01 DIAGNOSIS — Z5181 Encounter for therapeutic drug level monitoring: Secondary | ICD-10-CM | POA: Insufficient documentation

## 2023-07-01 NOTE — Progress Notes (Signed)
Assessment & Plan:  [facility]" was seen today for medical management of chronic issues.  Diagnoses and all orders for this visit:  Chronic bilateral low back pain without sciatica Pain currently resolved. No additional treatment, referral or imaging warranted at this time.     Patient has been counseled on age-appropriate routine health concerns for screening and prevention. These are reviewed and up-to-date. Referrals have been placed accordingly. Immunizations are up-to-date or declined.    Subjective:   Chief Complaint  Patient presents with   Back Pain   HPI Kathleen Savage 47 y.o. female presents to office today for follow up of back pain  Chronic Low back pain with BLE sciatica [name] was prescribed tylenol 3 at her last appt with me on 03-31-2023. At that time she endorsed back pain which was worse with prolonged standing, sitting and lying down. Pain radiating down both legs. Medications tried in the past include: mobic, flexeril, prednisone. Treatments include: physical therapy. She has seen Orthopedic specialist in the past as well. Denied any bowel or bladder incontinence.   She is currently prescribed muscle relaxant, gabapentin and Tylenol with codeine which she reports is effective in relieving her back pain.    Review of Systems  Constitutional:  Negative for fever, malaise/fatigue and weight loss.  HENT: Negative.  Negative for nosebleeds.   Eyes: Negative.  Negative for blurred vision, double vision and photophobia.  Respiratory: Negative.  Negative for cough and shortness of breath.   Cardiovascular: Negative.  Negative for chest pain, palpitations and leg swelling.  Gastrointestinal: Negative.  Negative for heartburn, nausea and vomiting.  Musculoskeletal: Negative.  Negative for myalgias.  Neurological: Negative.  Negative for dizziness, focal weakness, seizures and headaches.  Psychiatric/Behavioral: Negative.  Negative for suicidal ideas.      Past Medical History:  Diagnosis Date   Glucose intolerance (pre-diabetes)    HIV infection (HCC)    Hyperlipidemia     Past Surgical History:  Procedure Laterality Date   LOWER EXTREMITY VENOGRAPHY Left 05/12/2023   Procedure: LOWER EXTREMITY VENOGRAPHY;  Surgeon: Nada Libman, MD;  [facility];  Service: Cardiovascular;  Laterality: Left;   PERIPHERAL VASCULAR BALLOON ANGIOPLASTY  05/12/2023   Procedure: PERIPHERAL VASCULAR BALLOON ANGIOPLASTY;  Surgeon: Nada Libman, MD;  [facility];  Service: Cardiovascular;;   PERIPHERAL VASCULAR THROMBECTOMY N/A 05/12/2023   Procedure: MECHANICAL THROMBECTOMY;  Surgeon: Nada Libman, MD;  [facility];  Service: Cardiovascular;  Laterality: N/A;    Family History  Problem Relation Age of Onset   Cancer Maternal Aunt        Breast Cancer   Breast cancer Maternal Aunt    Cancer Cousin        Breast cancer - maternal cousin   Breast cancer Cousin    Cancer Cousin        breast cancer   Breast cancer Cousin    Cancer Cousin        breast cancer   Cancer Maternal Aunt        stomach cancer   Breast cancer Maternal Aunt    Hypertension Mother    Stroke Father    Cancer Sister    Uterine cancer Sister     Social History Reviewed with no changes to be made today.   Outpatient Medications Prior to Visit  Medication Sig Dispense Refill   acetaminophen-codeine (TYLENOL #3) 300-30 MG tablet Take 1-2 tablets by mouth every 8 (  eight) hours as needed for moderate pain. 60 tablet 0   bictegravir-emtricitabine-tenofovir AF (BIKTARVY) 50-200-25 MG TABS tablet Take 1 tablet by mouth daily. 30 tablet 11   gabapentin (NEURONTIN) 300 MG capsule Take 1 capsule (300 mg total) by mouth 3 (three) times daily. 90 capsule 3   rivaroxaban (XARELTO) 20 MG TABS tablet Take 1 tablet (20 mg total) by mouth daily with supper. Take with food. 90 tablet 1   No facility-administered medications prior to  visit.    No Known Allergies     Objective:    BP 113/75 (BP Location: Left Arm, Patient Position: Sitting, Cuff Size: Large)   Pulse 77   Ht 5\' 1"  (1.549 m)   Wt 254 lb 6.4 oz (115.4 kg)   LMP 06/17/2023 (Approximate)   SpO2 99%   BMI 48.07 kg/m  Wt Readings from Last 3 Encounters:  07/01/23 254 lb 6.4 oz (115.4 kg)  06/30/23 256 lb (116.1 kg)  06/05/23 249 lb 1.6 oz (113 kg)    Physical Exam Vitals and nursing note reviewed.  Constitutional:      Appearance: She is well-developed.  HENT:     Head: Normocephalic and atraumatic.  Cardiovascular:     Rate and Rhythm: Normal rate and regular rhythm.     Heart sounds: Normal heart sounds. No murmur heard.    No friction rub. No gallop.  Pulmonary:     Effort: Pulmonary effort is normal. No tachypnea or respiratory distress.     Breath sounds: Normal breath sounds. No decreased breath sounds, wheezing, rhonchi or rales.  Chest:     Chest wall: No tenderness.  Abdominal:     General: Bowel sounds are normal.     Palpations: Abdomen is soft.  Musculoskeletal:        General: Normal range of motion.     Cervical back: Normal range of motion.  Skin:    General: Skin is warm and dry.  Neurological:     Mental Status: She is alert and oriented to person, place, and time.     Coordination: Coordination normal.  Psychiatric:        Behavior: Behavior normal. Behavior is cooperative.        Thought Content: Thought content normal.        Judgment: Judgment normal.          Patient has been counseled extensively about nutrition and exercise as well as the importance of adherence with medications and regular follow-up. The patient was given clear instructions to go to ER or return to medical center if symptoms don't improve, worsen or new problems develop. The patient verbalized understanding.   Follow-up: Return in about 3 months (around 10/01/2023).   Claiborne Rigg, FNP-BC [facility]  [place], [place] [phone]   07/01/2023, 11:48 AM

## 2023-07-02 ENCOUNTER — Telehealth: Payer: Self-pay | Admitting: Licensed Clinical Social Worker

## 2023-07-02 NOTE — Telephone Encounter (Signed)
H&V Care Navigation CSW Progress Note  Clinical Social Worker contacted patient by phone to f/u on request from practice administrator Lawanna Kobus to reach out to pt again about financial assistance. Pt previously has been contacted by team lead Lasandra Beech, LCSW, but has not submitted any of the applications given. LCSW left voicemail requesting return call. Will re-attempt again tomorrow, request has been made of team to meet pt at [facility], I will see if myself or my colleague Eileen Stanford is able to meet with pt during this time. Since pt is active with [facility] and Wellness she will need to meet with Mikle Bosworth, Artist to submit [employer] and [employer].   Patient is participating in a Managed Medicaid Plan:  No, self pay only.   SDOH Screenings   Food Insecurity: No Food Insecurity (03/30/2023)  Housing: Low Risk  (03/30/2023)  Transportation Needs: No Transportation Needs (03/30/2023)  Alcohol Screen: Low Risk  (03/30/2023)  Depression (PHQ2-9): Low Risk  (06/30/2023)  Financial Resource Strain: Low Risk  (03/30/2023)  Physical Activity: Unknown (03/30/2023)  Social Connections: Moderately Isolated (07/01/2023)  Stress: No Stress Concern Present (03/30/2023)  Tobacco Use: Low Risk  (07/01/2023)    Octavio Graves, MSW, LCSW Clinical Social Worker II [facility] Heart/Vascular Care Navigation  [phone]- work cell phone (preferred) [phone]- desk phone

## 2023-07-03 ENCOUNTER — Telehealth (HOSPITAL_BASED_OUTPATIENT_CLINIC_OR_DEPARTMENT_OTHER): Payer: Self-pay | Admitting: Licensed Clinical Social Worker

## 2023-07-03 NOTE — Telephone Encounter (Signed)
H&V Care Navigation CSW Progress Note  Clinical Social Worker contacted patient by phone to f/u on request from practice administrator Lawanna Kobus to reach out to pt again about financial assistance. Pt previously has been contacted by team lead Lasandra Beech, LCSW, but has not submitted any of the applications given. LCSW was able to reach pt this morning with assistance of Spanish language interpreter Ramon Dredge [phone]), at [phone]. Introduced self, role, reason for call. Pt currently at work and was only able to answer a few questions. She recalls being provided these papers, is agreeable to speaking with me during [facility] appointment. Since pt is active with [facility] and Wellness she will need to meet with Mikle Bosworth, Artist to submit [employer] and [employer] for processing.  No additional questions at this time, will meet her during appt at [facility].    Patient is participating in a Managed Medicaid Plan:  No, self pay only  SDOH Screenings   Food Insecurity: No Food Insecurity (03/30/2023)  Housing: Low Risk  (03/30/2023)  Transportation Needs: No Transportation Needs (03/30/2023)  Alcohol Screen: Low Risk  (03/30/2023)  Depression (PHQ2-9): Low Risk  (06/30/2023)  Financial Resource Strain: Low Risk  (03/30/2023)  Physical Activity: Unknown (03/30/2023)  Social Connections: Moderately Isolated (07/01/2023)  Stress: No Stress Concern Present (03/30/2023)  Tobacco Use: Low Risk  (07/01/2023)    Octavio Graves, MSW, LCSW Clinical Social Worker II [facility] Heart/Vascular Care Navigation  [phone]- work cell phone (preferred) [phone]- desk phone

## 2023-07-06 ENCOUNTER — Encounter: Payer: Self-pay | Admitting: Surgery

## 2023-07-06 ENCOUNTER — Ambulatory Visit (INDEPENDENT_AMBULATORY_CARE_PROVIDER_SITE_OTHER): Payer: Self-pay | Admitting: Surgery

## 2023-07-06 ENCOUNTER — Ambulatory Visit (HOSPITAL_COMMUNITY)
Admission: RE | Admit: 2023-07-06 | Discharge: 2023-07-06 | Disposition: A | Discharge status: Home / Self Care | Payer: Self-pay | Source: Ambulatory Visit | Attending: Surgery | Admitting: Surgery

## 2023-07-06 ENCOUNTER — Telehealth: Payer: Self-pay | Admitting: Licensed Clinical Social Worker

## 2023-07-06 ENCOUNTER — Ambulatory Visit (INDEPENDENT_AMBULATORY_CARE_PROVIDER_SITE_OTHER)
Admission: RE | Admit: 2023-07-06 | Discharge: 2023-07-06 | Disposition: A | Discharge status: Home / Self Care | Payer: Self-pay | Source: Ambulatory Visit | Attending: Surgery | Admitting: Surgery

## 2023-07-06 VITALS — BP 124/84 | HR 79 | Temp 98.0°F | Resp 20 | Ht 61.0 in | Wt 255.0 lb

## 2023-07-06 DIAGNOSIS — I82422 Acute embolism and thrombosis of left iliac vein: Secondary | ICD-10-CM

## 2023-07-06 NOTE — Telephone Encounter (Addendum)
H&V Care Navigation CSW Progress Note  Clinical Social Worker contacted patient by phone to f/u on assistance applications. Pt had completed appts and been schecked out by scheduled appt time at [facility] so I missed speaking in person with her this morning. Was able to reach her with assistance of Hanley Hays, [place] language interpreter [phone]. Of note she speaks [place], she does like having an interpreter on the line just in case. She confirms home address, PCP, and is interested in me sending these applications. She will start working on gathering needed documents and is okay with me following up next week to ensure she has received paperwork and answer any additional questions. No additional questions/concerns at this time.   Patient is participating in a Managed Medicaid Plan:  No, self pay only  SDOH Screenings   Food Insecurity: No Food Insecurity (03/30/2023)  Housing: Low Risk  (03/30/2023)  Transportation Needs: No Transportation Needs (03/30/2023)  Alcohol Screen: Low Risk  (03/30/2023)  Depression (PHQ2-9): Low Risk  (06/30/2023)  Financial Resource Strain: Low Risk  (03/30/2023)  Physical Activity: Unknown (03/30/2023)  Social Connections: Moderately Isolated (07/01/2023)  Stress: No Stress Concern Present (03/30/2023)  Tobacco Use: Low Risk  (07/06/2023)   Octavio Graves, MSW, LCSW Clinical Social Worker II [facility] Heart/Vascular Care Navigation  [phone]- work cell phone (preferred) [phone]- desk phone

## 2023-07-06 NOTE — Progress Notes (Signed)
[facility]ist [facility]  Patient name: Kathleen Savage MRN: [id number] DOB: 09-19-1976 Sex: female   REASON FOR VISIT:    Follow up  HISOTRY OF PRESENT ILLNESS:    Kathleen Savage is a 47 y.o. female who presented to the emergency department on 05/08/2023 with left leg pain that was initially treated as cellulitis.  She returned for DVT ultrasound study that was positive for iliofemoral DVT.  She was not having significant swelling it was mainly pain.  Anticoagulation was started.  She underwent mechanical thrombectomy on 05/12/2023.  No significant thrombus was removed.  IVUS did show a possible narrowing of the left common iliac vein which was treated with a 12 mm balloon.  She is back today for follow-up.  Discussions were held in the presence of a Spanish interpreter.  She states that she will occasionally get pain in her left leg but this is significantly improved.  She does not have any swelling.   PAST MEDICAL HISTORY:   Past Medical History:  Diagnosis Date   Glucose intolerance (pre-diabetes)    HIV infection (HCC)    Hyperlipidemia      FAMILY HISTORY:   Family History  Problem Relation Age of Onset   Cancer Maternal Aunt        Breast Cancer   Breast cancer Maternal Aunt    Cancer Cousin        Breast cancer - maternal cousin   Breast cancer Cousin    Cancer Cousin        breast cancer   Breast cancer Cousin    Cancer Cousin        breast cancer   Cancer Maternal Aunt        stomach cancer   Breast cancer Maternal Aunt    Hypertension Mother    Stroke Father    Cancer Sister    Uterine cancer Sister     SOCIAL HISTORY:   Social History   Tobacco Use   Smoking status: Never   Smokeless tobacco: Never  Substance Use Topics   Alcohol use: Yes    Alcohol/week: 3.0 standard drinks of alcohol    Types: 1 Glasses of wine, 1 Cans of beer, 1 Shots of liquor per week    Comment: occasional      ALLERGIES:   No Known Allergies   CURRENT MEDICATIONS:   Current Outpatient Medications  Medication Sig Dispense Refill   acetaminophen-codeine (TYLENOL #3) 300-30 MG tablet Take 1-2 tablets by mouth every 8 (eight) hours as needed for moderate pain. 60 tablet 0   bictegravir-emtricitabine-tenofovir AF (BIKTARVY) 50-200-25 MG TABS tablet Take 1 tablet by mouth daily. 30 tablet 11   gabapentin (NEURONTIN) 300 MG capsule Take 1 capsule (300 mg total) by mouth 3 (three) times daily. 90 capsule 3   rivaroxaban (XARELTO) 20 MG TABS tablet Take 1 tablet (20 mg total) by mouth daily with supper. Take with food. 90 tablet 1   No current facility-administered medications for this visit.    REVIEW OF SYSTEMS:   [X]  denotes positive finding, [ ]  denotes negative finding Cardiac  Comments:  Chest pain or chest pressure:    Shortness of breath upon exertion:    Short of breath when lying flat:    Irregular heart rhythm:        Vascular    Pain in calf, thigh, or hip brought on by ambulation:    Pain in feet at night that wakes you up from your  sleep:     Blood clot in your veins:    Leg swelling:         Pulmonary    Oxygen at home:    Productive cough:     Wheezing:         Neurologic    Sudden weakness in arms or legs:     Sudden numbness in arms or legs:     Sudden onset of difficulty speaking or slurred speech:    Temporary loss of vision in one eye:     Problems with dizziness:         Gastrointestinal    Blood in stool:     Vomited blood:         Genitourinary    Burning when urinating:     Blood in urine:        Psychiatric    Major depression:         Hematologic    Bleeding problems:    Problems with blood clotting too easily:        Skin    Rashes or ulcers:        Constitutional    Fever or chills:      PHYSICAL EXAM:   Vitals:   07/06/23 0828  BP: 124/84  Pulse: 79  Resp: 20  Temp: 98 F (36.7 C)  SpO2: 97%  Weight: 255 lb (115.7 kg)   Height: 5\' 1"  (1.549 m)    GENERAL: The patient is a well-nourished female, in no acute distress. The vital signs are documented above. CARDIAC: There is a regular rate and rhythm.  PULMONARY: Non-labored respirations  MUSCULOSKELETAL: There are no major deformities or cyanosis. NEUROLOGIC: No focal weakness or paresthesias are detected. SKIN: There are no ulcers or rashes noted. PSYCHIATRIC: The patient has a normal affect.  STUDIES:   I have reviewed the following: IVC/Iliac: No visualized thrombus in IVC and Iliac veins.  Findings consistent with residual distal femoral vein thrombosis and  residual superficial left great saphenous vein thromboisis mid thigh area.  All other deep veins appear patent.  MEDICAL ISSUES:    Unprovoked left leg DVT: The patient is essentially symptom-free currently.  Ultrasound shows no significant ileal caval thrombus.  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  She has seen hematology.  They are recommending 6 months of anticoagulation unless her hypercoagulable workup turns up something positive.  From my perspective she can follow-up on an as-needed basis and contact me if she has recurrent symptoms    Charlena Cross, MD, FACS [facility] [phone] Pager [phone]

## 2023-07-07 ENCOUNTER — Ambulatory Visit (HOSPITAL_COMMUNITY)
Admission: RE | Admit: 2023-07-07 | Discharge: 2023-07-07 | Disposition: A | Discharge status: Home / Self Care | Payer: Self-pay | Source: Ambulatory Visit | Attending: Hematology and Oncology | Admitting: Hematology and Oncology

## 2023-07-07 DIAGNOSIS — I82412 Acute embolism and thrombosis of left femoral vein: Secondary | ICD-10-CM | POA: Insufficient documentation

## 2023-07-07 MED ORDER — SODIUM CHLORIDE (PF) 0.9 % IJ SOLN
INTRAMUSCULAR | Status: AC
  Filled 2023-07-07: qty 50

## 2023-07-07 MED ORDER — IOHEXOL 300 MG/ML  SOLN
100.0000 mL | Freq: Once | INTRAMUSCULAR | Status: AC | PRN
  Administered 2023-07-07: 100 mL via INTRAVENOUS

## 2023-07-14 ENCOUNTER — Telehealth: Payer: Self-pay | Admitting: Licensed Clinical Social Worker

## 2023-07-14 NOTE — Telephone Encounter (Addendum)
H&V Care Navigation CSW Progress Note  Clinical Social Worker contacted patient by phone to f/u on assistance applications. Called twice, no answer at [phone]. With assistance of Spanish language interpreter Currie Paris, [phone] I left voicemail. Will re-attempt as able.  Patient is participating in a Managed Medicaid Plan:  No, self  pay only  SDOH Screenings   Food Insecurity: No Food Insecurity (03/30/2023)  Housing: Low Risk  (03/30/2023)  Transportation Needs: No Transportation Needs (03/30/2023)  Alcohol Screen: Low Risk  (03/30/2023)  Depression (PHQ2-9): Low Risk  (06/30/2023)  Financial Resource Strain: Low Risk  (03/30/2023)  Physical Activity: Unknown (03/30/2023)  Social Connections: Moderately Isolated (07/01/2023)  Stress: No Stress Concern Present (03/30/2023)  Tobacco Use: Low Risk  (07/06/2023)   Octavio Graves, MSW, LCSW Clinical Social Worker II [facility] Heart/Vascular Care Navigation  [phone]- work cell phone (preferred) [phone]- desk phone

## 2023-07-15 ENCOUNTER — Inpatient Hospital Stay: Payer: Self-pay | Attending: Hematology and Oncology | Admitting: Hematology and Oncology

## 2023-07-15 ENCOUNTER — Encounter: Payer: Self-pay | Admitting: Hematology and Oncology

## 2023-07-15 DIAGNOSIS — I82412 Acute embolism and thrombosis of left femoral vein: Secondary | ICD-10-CM

## 2023-07-15 NOTE — Progress Notes (Signed)
[facility] CONSULT NOTE  Patient Care Team: Claiborne Rigg, NP as PCP - General (Nurse Practitioner) Cliffton Asters, MD (Inactive) as PCP - Infectious Diseases (Infectious Diseases)  CHIEF COMPLAINTS/PURPOSE OF CONSULTATION:  Acute DVT  ASSESSMENT & PLAN:   This is a very pleasant 47 year old female patient with no significant past medical history except HIV on Edwyna Shell, bilateral lower extremity pain which has continued to progress over the past 2 years and newly diagnosed acute DVT of the left lower extremity with no clear provoking factors referred to hematology for additional recommendations.  She is now on Xarelto for anticoagulation and tolerating it well.  No concerns on exam.  Left lower extremity swelling/asymmetry has resolved. No other concerns on physical exam.  Bilateral breast exam is also normal.  Given this unexplained lower back pain which has become progressive and unprovoked DVT, have recommended some imaging to look for occult malignancy.  She is here for telephone visit to follow-up on the CT imaging, this was negative for malignancy.  She has also seen vascular surgery in the interim, they repeated an ultrasound which showed chronic residual thrombus.  She is feeling well otherwise.  Like we have spoken before, we have discussed about 6 months of anticoagulation, at the end of anticoagulation I can consider doing hypercoagulable workup and see if she needs longer anticoagulation.  She is agreeable with this plan.  All her questions were answered to the best my knowledge.  Thank you for consulting [place] in the care of this patient. Please do not hesitate to contact us with any additional questions or concerns.  HISTORY OF PRESENTING ILLNESS:  Kathleen Savage 47 y.o. female is here because of acute DVT  This is a 48 year old female patient with past medical history significant for HIV on HAART managed by Dr. Bonnita Nasuti who has been having severe lower extremity pain  for the past 2 years, she describes it as lower back pain which radiates to both lower extremities and it has gotten so bad that she had to cut her work hours.  Other than this pain, she most recently had some redness and swelling of the left thigh hence went to the emergency room, initially was thought to have cellulitis but when she had a vascular ultrasound, she was found to have acute DVT involving the left external iliac vein, left common femoral vein, thrombus located in the left common femoral vein appears to be loosely attached.  Findings consistent with acute superficial vein thrombosis involving the left great saphenous vein junction to proximal calf.  She is now on Xarelto for anticoagulation and is here for telephone visit.  She denies any new health complaint since her last visit.  She has been taking Xarelto as recommended.  Rest of the pertinent 10 point ROS reviewed and negative  REVIEW OF SYSTEMS:   Constitutional: Denies fevers, chills or abnormal night sweats Eyes: Denies blurriness of vision, double vision or watery eyes Ears, nose, mouth, throat, and face: Denies mucositis or sore throat Respiratory: Denies cough, dyspnea or wheezes Cardiovascular: Denies palpitation, chest discomfort or lower extremity swelling Gastrointestinal:  Denies nausea, heartburn or change in bowel habits Skin: Denies abnormal skin rashes Lymphatics: Denies new lymphadenopathy or easy bruising Neurological:Denies numbness, tingling or new weaknesses Behavioral/Psych: Mood is stable, no new changes  All other systems were reviewed with the patient and are negative.  MEDICAL HISTORY:  Past Medical History:  Diagnosis Date   Glucose intolerance (pre-diabetes)    HIV infection (HCC)  Hyperlipidemia     SURGICAL HISTORY: Past Surgical History:  Procedure Laterality Date   LOWER EXTREMITY VENOGRAPHY Left 05/12/2023   Procedure: LOWER EXTREMITY VENOGRAPHY;  Surgeon: Nada Libman, MD;  Location:  [facility];  Service: Cardiovascular;  Laterality: Left;   PERIPHERAL VASCULAR BALLOON ANGIOPLASTY  05/12/2023   Procedure: PERIPHERAL VASCULAR BALLOON ANGIOPLASTY;  Surgeon: Nada Libman, MD;  Location: [facility];  Service: Cardiovascular;;   PERIPHERAL VASCULAR THROMBECTOMY N/A 05/12/2023   Procedure: MECHANICAL THROMBECTOMY;  Surgeon: Nada Libman, MD;  Location: [facility];  Service: Cardiovascular;  Laterality: N/A;    SOCIAL HISTORY: Social History   Socioeconomic History   Marital status: Married    Spouse name: Not on file   Number of children: 2   Years of education: Not on file   Highest education level: 6th grade  Occupational History   Not on file  Tobacco Use   Smoking status: Never   Smokeless tobacco: Never  Vaping Use   Vaping status: Never Used  Substance and Sexual Activity   Alcohol use: Yes    Alcohol/week: 3.0 standard drinks of alcohol    Types: 1 Glasses of wine, 1 Cans of beer, 1 Shots of liquor per week    Comment: occasional   Drug use: No   Sexual activity: Yes    Partners: Male    Birth control/protection: Condom    Comment: given condoms  Other Topics Concern   Not on file  Social History Narrative   Not on file   Social Determinants of Health   Financial Resource Strain: Low Risk  (03/30/2023)   Overall Financial Resource Strain (CARDIA)    Difficulty of Paying Living Expenses: Not very hard  Food Insecurity: No Food Insecurity (03/30/2023)   Hunger Vital Sign    Worried About Running Out of Food in the Last Year: Never true    Ran Out of Food in the Last Year: Never true  Transportation Needs: No Transportation Needs (03/30/2023)   PRAPARE - Administrator, Civil Service (Medical): No    Lack of Transportation (Non-Medical): No  Physical Activity: Unknown (03/30/2023)   Exercise Vital Sign    Days of Exercise per Week: 0 days    Minutes of Exercise per Session: Not on file  Stress: No Stress  Concern Present (03/30/2023)   [employer] of Occupational Health - Occupational Stress Questionnaire    Feeling of Stress : Only a little  Social Connections: Moderately Isolated (07/01/2023)   Social Connection and Isolation Panel [NHANES]    Frequency of Communication with Friends and Family: Three times a week    Frequency of Social Gatherings with Friends and Family: Twice a week    Attends Religious Services: Never    Database administrator or Organizations: No    Attends Engineer, structural: Never    Marital Status: Married  Catering manager Violence: Not on file    FAMILY HISTORY: Family History  Problem Relation Age of Onset   Cancer Maternal Aunt        Breast Cancer   Breast cancer Maternal Aunt    Cancer Cousin        Breast cancer - maternal cousin   Breast cancer Cousin    Cancer Cousin        breast cancer   Breast cancer Cousin    Cancer Cousin        breast cancer  Cancer Maternal Aunt        stomach cancer   Breast cancer Maternal Aunt    Hypertension Mother    Stroke Father    Cancer Sister    Uterine cancer Sister     ALLERGIES:  has No Known Allergies.  MEDICATIONS:  Current Outpatient Medications  Medication Sig Dispense Refill   acetaminophen-codeine (TYLENOL #3) 300-30 MG tablet Take 1-2 tablets by mouth every 8 (eight) hours as needed for moderate pain. 60 tablet 0   bictegravir-emtricitabine-tenofovir AF (BIKTARVY) 50-200-25 MG TABS tablet Take 1 tablet by mouth daily. 30 tablet 11   gabapentin (NEURONTIN) 300 MG capsule Take 1 capsule (300 mg total) by mouth 3 (three) times daily. 90 capsule 3   rivaroxaban (XARELTO) 20 MG TABS tablet Take 1 tablet (20 mg total) by mouth daily with supper. Take with food. 90 tablet 1   No current facility-administered medications for this visit.     PHYSICAL EXAMINATION: ECOG PERFORMANCE STATUS: 0 - Asymptomatic  There were no vitals filed for this visit.  There were no vitals filed for  this visit.  Physical exam deferred, telephone visit LABORATORY DATA:  I have reviewed the data as listed Lab Results  Component Value Date   WBC 6.6 06/16/2023   HGB 11.1 (L) 06/16/2023   HCT 33.5 (L) 06/16/2023   MCV 87.5 06/16/2023   PLT 375 06/16/2023     Chemistry      Component Value Date/Time   NA 136 06/16/2023 0851   NA 138 12/29/2022 1021   K 4.2 06/16/2023 0851   CL 105 06/16/2023 0851   CO2 23 06/16/2023 0851   BUN 17 06/16/2023 0851   BUN 11 12/29/2022 1021   CREATININE 0.59 06/16/2023 0851      Component Value Date/Time   CALCIUM 8.1 (L) 06/16/2023 0851   ALKPHOS 69 12/29/2022 1021   AST 14 06/16/2023 0851   ALT 20 06/16/2023 0851   BILITOT 0.2 06/16/2023 0851   BILITOT 0.3 12/29/2022 1021       RADIOGRAPHIC STUDIES: I have personally reviewed the radiological images as listed and agreed with the findings in the report. CT CHEST ABDOMEN PELVIS W CONTRAST  Result Date: 07/07/2023 CLINICAL DATA:  Unexplained DDT, severe back pain and 47 year old. Assess for occult malignancy. * Tracking Code: BO * EXAM: CT CHEST, ABDOMEN, AND PELVIS WITH CONTRAST TECHNIQUE: Multidetector CT imaging of the chest, abdomen and pelvis was performed following the standard protocol during bolus administration of intravenous contrast. RADIATION DOSE REDUCTION: This exam was performed according to the departmental dose-optimization program which includes automated exposure control, adjustment of the mA and/or kV according to patient size and/or use of iterative reconstruction technique. CONTRAST:  OMNIPAQUE IOHEXOL 300 MG/ML  SOLN COMPARISON:  None Available. FINDINGS: CT CHEST FINDINGS Cardiovascular: Normal caliber thoracic aorta. No central pulmonary embolus on this nondedicated study. Normal size heart. No significant pericardial effusion/thickening. Mediastinum/Nodes: No suspicious thyroid nodule no pathologically enlarged mediastinal, hilar or axillary lymph nodes. The  esophagus is grossly unremarkable. Lungs/Pleura: No suspicious pulmonary nodules or masses. Scattered atelectasis/scarring. No pleural effusion. No pneumothorax Musculoskeletal: No aggressive lytic or blastic lesion of bone. No acute osseous abnormality. CT ABDOMEN PELVIS FINDINGS Hepatobiliary: Diffuse hepatic steatosis. No suspicious hepatic lesion cholelithiasis without findings of acute cholecystitis. No biliary ductal dilation. Pancreas: No pancreatic ductal dilation or evidence of acute inflammation Spleen: No splenomegaly. Adrenals/Urinary Tract: Bilateral adrenal glands appear normal. No hydronephrosis. Kidneys demonstrate symmetric enhancement. Urinary bladder is unremarkable for  degree of distension. Stomach/Bowel: Stomach is unremarkable for degree of distension. No pathologic dilation of small or large bowel. No evidence of acute bowel inflammation. No focal wall thickening or suspicious colonic mass identified. Vascular/Lymphatic: Normal caliber abdominal aorta. Circumaortic left renal vein. Smooth IVC contours no pathologically enlarged abdominal or pelvic lymph nodes. Reproductive: Uterus and bilateral adnexa are unremarkable. Other: Trace pelvic free fluid is within physiologic normal limits for reproductive age female. Musculoskeletal: No aggressive lytic or blastic lesion of bone. Multilevel degenerative changes spine. Degenerative change of the bilateral hips. Asymmetric sclerosis of the right SI joint. IMPRESSION: 1. No evidence of malignancy in the chest, abdomen or pelvis. 2. Diffuse hepatic steatosis. 3. Cholelithiasis without findings of acute cholecystitis. 4. Asymmetric sclerosis of the right SI joint, which is nonspecific but can be seen in the setting of sacroiliitis. Electronically Signed   By: Maudry Mayhew M.D.   On: 07/07/2023 12:00   VAS [place] LOWER EXTREMITY VENOUS (DVT)  Result Date: 07/06/2023  Lower Venous DVT Study Patient Name:  Kathleen Savage  Date of Exam:   07/06/2023  Medical Rec #: [id number]           Accession #:    [id number] Date of Birth: [birth year]           Patient Gender: F Patient Age:   46 years Exam Location:  [facility] Procedure:      VAS [place] LOWER EXTREMITY VENOUS (DVT) Referring Phys: Coral Else --------------------------------------------------------------------------------  Indications: Follow up DVT of the left lower extremity. Other Indications: 05/12/2023 Mechanical venous thrombectomy and venoplasty of the                    left common iliac, external iliac, common femoral and femoral                    vein. Comparison Study: Prior pre-op exam 05/08/2023 revealed acute deep vein                   thrombosis in the left EIV and left CFV, superficial vein                   thrombosis in the Greater saphenous vein from the junction to                   the proximal calf. Performing Technologist: Elita Quick RVT  Examination Guidelines: A complete evaluation includes B-mode imaging, spectral Doppler, color Doppler, and power Doppler as needed of all accessible portions of each vessel. Bilateral testing is considered an integral part of a complete examination. Limited examinations for reoccurring indications may be performed as noted. The reflux portion of the exam is performed with the patient in reverse Trendelenburg.  +-----+---------------+---------+-----------+----------+--------------+ RIGHTCompressibilityPhasicitySpontaneityPropertiesThrombus Aging +-----+---------------+---------+-----------+----------+--------------+ CFV  Full           Yes      Yes                                 +-----+---------------+---------+-----------+----------+--------------+   +---------+---------------+---------+-----------+----------+--------------+ LEFT     CompressibilityPhasicitySpontaneityPropertiesThrombus Aging +---------+---------------+---------+-----------+----------+--------------+ CFV      Full           Yes      Yes                                  +---------+---------------+---------+-----------+----------+--------------+  SFJ      Full           Yes      Yes                                 +---------+---------------+---------+-----------+----------+--------------+ FV Prox  Full           Yes      Yes                                 +---------+---------------+---------+-----------+----------+--------------+ FV Mid   Full           Yes      Yes                                 +---------+---------------+---------+-----------+----------+--------------+ FV DistalFull           Yes      Yes                  residual       +---------+---------------+---------+-----------+----------+--------------+ PFV      Full           Yes      Yes                                 +---------+---------------+---------+-----------+----------+--------------+ POP      Full           Yes      Yes                                 +---------+---------------+---------+-----------+----------+--------------+ PTV      Full           Yes      Yes                                 +---------+---------------+---------+-----------+----------+--------------+ PERO     Full           Yes      Yes                                 +---------+---------------+---------+-----------+----------+--------------+ Gastroc  Full           Yes      Yes                                 +---------+---------------+---------+-----------+----------+--------------+ GSV      Partial        Yes      Yes                  residual       +---------+---------------+---------+-----------+----------+--------------+ SSV      Full           Yes      Yes                                 +---------+---------------+---------+-----------+----------+--------------+     Summary: LEFT: Findings  consistent with residual distal femoral vein thrombosis and residual superficial left great saphenous vein thromboisis mid thigh area. All other  deep veins appear patent.  .  *See table(s) above for measurements and observations. Electronically signed by Coral Else MD on 07/06/2023 at 3:41:54 PM.    Final    VAS [place] IVC/ILIAC (VENOUS ONLY)  Result Date: 07/06/2023 IVC/ILIAC STUDY Patient Name:  Kathleen Savage  Date of Exam:   07/06/2023 Medical Rec #: [id number]           Accession #:    [id number] Date of Birth: 1976/08/17           Patient Gender: F Patient Age:   35 years Exam Location:  [facility] Procedure:      VAS [place] IVC/ILIAC (VENOUS ONLY) Referring Phys: Coral Else --------------------------------------------------------------------------------  Indications: Surgery date 05/12/2023. Vascular Interventions: Mechanical venous thrombectomy and venoplasty of the                         left common iliac, external iliac, common femoral and                         femoral vein. Limitations: Air/bowel gas and obesity.  Performing Technologist: Elita Quick RVT  Examination Guidelines: A complete evaluation includes B-mode imaging, spectral Doppler, color Doppler, and power Doppler as needed of all accessible portions of each vessel. Bilateral testing is considered an integral part of a complete examination. Limited examinations for reoccurring indications may be performed as noted.  IVC/Iliac Findings: +----------+------+--------+--------+    IVC    PatentThrombusComments +----------+------+--------+--------+ IVC Prox  patent                 +----------+------+--------+--------+ IVC Mid   patent                 +----------+------+--------+--------+ IVC Distalpatent                 +----------+------+--------+--------+  +-------------------+---------+-----------+---------+-----------+--------+         CIV        RT-PatentRT-ThrombusLT-PatentLT-ThrombusComments +-------------------+---------+-----------+---------+-----------+--------+ Common Iliac Prox   patent              patent                       +-------------------+---------+-----------+---------+-----------+--------+ Common Iliac Mid    patent              patent                      +-------------------+---------+-----------+---------+-----------+--------+ Common Iliac Distal patent              patent                      +-------------------+---------+-----------+---------+-----------+--------+  +-------------------------+---------+-----------+---------+-----------+--------+            EIV           RT-PatentRT-ThrombusLT-PatentLT-ThrombusComments +-------------------------+---------+-----------+---------+-----------+--------+ External Iliac Vein Prox  patent              patent                      +-------------------------+---------+-----------+---------+-----------+--------+ External Iliac Vein Mid   patent              patent                      +-------------------------+---------+-----------+---------+-----------+--------+  External Iliac Vein       patent              patent                      Distal                                                                    +-------------------------+---------+-----------+---------+-----------+--------+   Summary: IVC/Iliac: No visualized thrombus in IVC and Iliac veins.  *See table(s) above for measurements and observations.   Preliminary     Total time: 10 min  I connected with  Kathleen Savage on 07/15/23 by a telephone application and verified that I am speaking with the correct person using two identifiers.   I discussed the limitations of evaluation and management by telemedicine. The patient expressed understanding and agreed to proceed.   All questions were answered. The patient knows to call the clinic with any problems, questions or concerns.   Rachel Moulds, MD 07/15/2023 1:32 PM

## 2023-07-22 ENCOUNTER — Telehealth: Payer: Self-pay | Admitting: Licensed Clinical Social Worker

## 2023-07-22 NOTE — Telephone Encounter (Signed)
H&V Care Navigation CSW Progress Note  Clinical Social Worker contacted patient by phone to f/u on assistance applications. Called twice, no answer at [phone]. With assistance of Spanish language interpreter [place], [phone]- I left voicemail. Will re-attempt as able.   Patient is participating in a Managed Medicaid Plan:  No, self  pay only    SDOH Screenings   Food Insecurity: No Food Insecurity (03/30/2023)  Housing: Low Risk  (03/30/2023)  Transportation Needs: No Transportation Needs (03/30/2023)  Alcohol Screen: Low Risk  (03/30/2023)  Depression (PHQ2-9): Low Risk  (06/30/2023)  Financial Resource Strain: Low Risk  (03/30/2023)  Physical Activity: Unknown (03/30/2023)  Social Connections: Moderately Isolated (07/01/2023)  Stress: No Stress Concern Present (03/30/2023)  Tobacco Use: Low Risk  (07/15/2023)

## 2023-07-24 ENCOUNTER — Telehealth (HOSPITAL_BASED_OUTPATIENT_CLINIC_OR_DEPARTMENT_OTHER): Payer: Self-pay | Admitting: Licensed Clinical Social Worker

## 2023-07-24 ENCOUNTER — Other Ambulatory Visit (HOSPITAL_COMMUNITY): Payer: Self-pay

## 2023-07-24 NOTE — Telephone Encounter (Signed)
H&V Care Navigation CSW Progress Note  Clinical Social Worker contacted patient by phone to f/u on assistance applications. Was able to reach her briefly this morning at [phone]. With assistance of spanish language interpreter Johnna Acosta [phone] she shares she is at work and unable to speak long, has appt with Mikle Bosworth on Tuesday. No additional concerns. I will f/u with her after then if any additional questions.  Patient is participating in a Managed Medicaid Plan:  No, self pay only  SDOH Screenings   Food Insecurity: No Food Insecurity (03/30/2023)  Housing: Low Risk  (03/30/2023)  Transportation Needs: No Transportation Needs (03/30/2023)  Alcohol Screen: Low Risk  (03/30/2023)  Depression (PHQ2-9): Low Risk  (06/30/2023)  Financial Resource Strain: Low Risk  (03/30/2023)  Physical Activity: Unknown (03/30/2023)  Social Connections: Moderately Isolated (07/01/2023)  Stress: No Stress Concern Present (03/30/2023)  Tobacco Use: Low Risk  (07/15/2023)    Octavio Graves, MSW, LCSW Clinical Social Worker II [facility] Heart[facility]Vascular Care Navigation  [phone]- work cell phone (preferred) [phone]- desk phone

## 2023-07-28 ENCOUNTER — Encounter: Payer: Self-pay | Admitting: Physical Medicine and Rehabilitation

## 2023-07-28 ENCOUNTER — Other Ambulatory Visit: Payer: Self-pay

## 2023-07-28 ENCOUNTER — Encounter: Payer: Self-pay | Attending: Physical Medicine & Rehabilitation | Admitting: Physical Medicine and Rehabilitation

## 2023-07-28 VITALS — BP 127/88 | HR 88 | Ht 61.0 in | Wt 258.2 lb

## 2023-07-28 DIAGNOSIS — G894 Chronic pain syndrome: Secondary | ICD-10-CM

## 2023-07-28 DIAGNOSIS — Z79891 Long term (current) use of opiate analgesic: Secondary | ICD-10-CM

## 2023-07-28 DIAGNOSIS — Z5181 Encounter for therapeutic drug level monitoring: Secondary | ICD-10-CM

## 2023-07-28 MED ORDER — TOPIRAMATE 25 MG PO TABS
25.0000 mg | ORAL_TABLET | Freq: Every evening | ORAL | 3 refills | Status: DC
  Filled 2023-07-28: qty 90, 90d supply, fill #0
  Filled 2023-11-11: qty 90, 90d supply, fill #1
  Filled 2024-02-05: qty 90, 90d supply, fill #2

## 2023-07-28 NOTE — Progress Notes (Signed)
Subjective:    Patient ID: Kathleen Savage, female    DOB: 1976-09-29, 47 y.o.   MRN: [id number]  HPI Kathleen Savage is a 47 year old woman who presents to establish care for low back pain.  1) Low back pain -radiated into legs -has failed stretches, voltaren gel, icy hot, tylenol #3, physical therapy -she had a CT scan but not an MRI due to cost because she is self pay -discussed that XR shows multilevel changes  2) Insomnia: -pain in legs makes it hard to sleep at night -she takes gabapentin twice per day, helps a little but not completely.    Pain Inventory Average Pain 9 Pain Right Now 5 My pain is burning and aching  In the last 24 hours, has pain interfered with the following? General activity 8 Relation with others 8 Enjoyment of life 10 What TIME of day is your pain at its worst? daytime, evening, and night Sleep (in general) Fair  Pain is worse with: walking, bending, standing, and some activites Pain improves with: rest and medication Relief from Meds: 3  walk without assistance  employed # of hrs/week 20-30 what is your job? [employer]  spasms depression  Any changes since last visit?  no  Primary care [facility]    Family History  Problem Relation Age of Onset   Cancer Maternal Aunt        Breast Cancer   Breast cancer Maternal Aunt    Cancer Cousin        Breast cancer - maternal cousin   Breast cancer Cousin    Cancer Cousin        breast cancer   Breast cancer Cousin    Cancer Cousin        breast cancer   Cancer Maternal Aunt        stomach cancer   Breast cancer Maternal Aunt    Hypertension Mother    Stroke Father    Cancer Sister    Uterine cancer Sister    Social History   Socioeconomic History   Marital status: Married    Spouse name: Not on file   Number of children: 2   Years of education: Not on file   Highest education level: 6th grade  Occupational History   Not on file  Tobacco Use   Smoking status:  Never   Smokeless tobacco: Never  Vaping Use   Vaping status: Never Used  Substance and Sexual Activity   Alcohol use: Yes    Alcohol/week: 3.0 standard drinks of alcohol    Types: 1 Glasses of wine, 1 Cans of beer, 1 Shots of liquor per week    Comment: occasional   Drug use: No   Sexual activity: Yes    Partners: Male    Birth control/protection: Condom    Comment: given condoms  Other Topics Concern   Not on file  Social History Narrative   Not on file   Social Determinants of Health   Financial Resource Strain: Low Risk  (03/30/2023)   Overall Financial Resource Strain (CARDIA)    Difficulty of Paying Living Expenses: Not very hard  Food Insecurity: No Food Insecurity (03/30/2023)   Hunger Vital Sign    Worried About Running Out of Food in the Last Year: Never true    Ran Out of Food in the Last Year: Never true  Transportation Needs: No Transportation Needs (03/30/2023)   PRAPARE - Administrator, Civil Service (Medical): No  Lack of Transportation (Non-Medical): No  Physical Activity: Unknown (03/30/2023)   Exercise Vital Sign    Days of Exercise per Week: 0 days    Minutes of Exercise per Session: Not on file  Stress: No Stress Concern Present (03/30/2023)   [employer] of Occupational Health - Occupational Stress Questionnaire    Feeling of Stress : Only a little  Social Connections: Moderately Isolated (07/01/2023)   Social Connection and Isolation Panel [NHANES]    Frequency of Communication with Friends and Family: Three times a week    Frequency of Social Gatherings with Friends and Family: Twice a week    Attends Religious Services: Never    Database administrator or Organizations: No    Attends Banker Meetings: Never    Marital Status: Married   Past Surgical History:  Procedure Laterality Date   LOWER EXTREMITY VENOGRAPHY Left 05/12/2023   Procedure: LOWER EXTREMITY VENOGRAPHY;  Surgeon: Nada Libman, MD;  Location: [facility]  INV[facility];  Service: Cardiovascular;  Laterality: Left;   PERIPHERAL VASCULAR BALLOON ANGIOPLASTY  05/12/2023   Procedure: PERIPHERAL VASCULAR BALLOON ANGIOPLASTY;  Surgeon: Nada Libman, MD;  Location: [facility];  Service: Cardiovascular;;   PERIPHERAL VASCULAR THROMBECTOMY N/A 05/12/2023   Procedure: MECHANICAL THROMBECTOMY;  Surgeon: Nada Libman, MD;  Location: [facility];  Service: Cardiovascular;  Laterality: N/A;   Past Medical History:  Diagnosis Date   Glucose intolerance (pre-diabetes)    HIV infection (HCC)    Hyperlipidemia    BP 127/88   Pulse 88   Ht 5\' 1"  (1.549 m)   Wt 258 lb 3.2 oz (117.1 kg)   LMP 06/17/2023 (Approximate)   SpO2 98%   BMI 48.79 kg/m   Opioid Risk Score:   Fall Risk Score:  `1  Depression screen [facility] 2/9     07/28/2023    9:56 AM 06/30/2023   10:58 AM 03/31/2023    9:13 AM 09/24/2022   10:02 AM 06/25/2022    9:24 AM 02/12/2022    3:39 PM 12/23/2021    9:57 AM  Depression screen PHQ 2/9  Decreased Interest 2 0 3 3 1  0 3  Down, Depressed, Hopeless 1 0 1 3 1  0 0  PHQ - 2 Score 3 0 4 6 2  0 3  Altered sleeping 1  1 2   0 0  Tired, decreased energy 3  3 3   0 2  Change in appetite 0  0 1  0 0  Feeling bad or failure about yourself  1  2 3   0 2  Trouble concentrating 0  0 0  0 0  Moving slowly or fidgety/restless 3  0 3  0 0  Suicidal thoughts 0  0 0  0 0  PHQ-9 Score 11  10 18   0 7  Difficult doing work/chores       Not difficult at all     Review of Systems  Constitutional: Negative.   HENT: Negative.    Eyes: Negative.   Respiratory: Negative.    Cardiovascular: Negative.   Gastrointestinal: Negative.   Endocrine: Negative.   Genitourinary: Negative.   Musculoskeletal:  Positive for back pain.       Spasms  Skin: Negative.   Allergic/Immunologic: Negative.   Neurological: Negative.   Hematological:  Bruises/bleeds easily.       Xarelto - hx blood clot  Psychiatric/Behavioral:  Positive for dysphoric mood.    All other systems reviewed  and are negative.      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  -Discussed benefits of exercise in reducing pain.  Turmeric to reduce inflammation--can be used in cooking or taken as a supplement.  Benefits of turmeric:  -Highly anti-inflammatory  -Increases antioxidants  -Improves memory, attention, brain disease  -Lowers risk of heart disease  -May help prevent cancer  -Decreases pain  -Alleviates depression  -Delays aging and decreases risk of chronic disease  -Consume with black pepper to increase absorption    Turmeric Milk Recipe:  1 cup milk  1 tsp turmeric  1 tsp cinnamon  1 tsp grated ginger (optional)  Black pepper (boosts the anti-inflammatory properties of turmeric).  1 tsp honey  -Discussed following foods that may reduce pain: 1) Ginger (especially studied for arthritis)- reduce leukotriene production to decrease inflammation 2) Blueberries- high in phytonutrients that decrease inflammation 3) Salmon- marine omega-3s reduce joint swelling and pain 4) Pumpkin seeds- reduce inflammation 5) dark chocolate- reduces inflammation 6) turmeric- reduces inflammation 7) tart cherries - reduce pain and stiffness 8) extra virgin olive oil - its compound olecanthal helps to block prostaglandins  9) chili peppers- can be eaten or applied topically via capsaicin 10) mint- helpful for headache, muscle aches, joint pain, and itching 11) garlic- reduces inflammation  Link to further information on diet for chronic pain:  [email or web address].bray.[email or web address]   2) Insomnia: -discussed topamax at night, prescribed  -Try to go outside near sunrise -Get exercise during the day.  -Turn off all devices an hour before bedtime.  -Teas that can benefit: chamomile, valerian root, Brahmi (Bacopa) -Can consider over the counter melatonin, magnesium, and/or L-theanine. Melatonin is an anti-oxidant with multiple health benefits. Magnesium is involved in greater than 300 enzymatic reactions in the body and most of [place] are deficient as our soil is often depleted. There are 7 different types of magnesium- Bioptemizer's is a supplement with all 7 types, and each has unique benefits. Magnesium can also help with constipation and anxiety.  -Pistachios naturally increase the production of melatonin -Cozy Earth bamboo bed sheets are free from toxic chemicals.  -Tart cherry juice or a tart cherry supplement can improve sleep and soreness post-workout

## 2023-07-28 NOTE — Patient Instructions (Signed)
Turmeric to reduce inflammation--can be used in cooking or taken as a supplement.  Benefits of turmeric:  -Highly anti-inflammatory  -Increases antioxidants  -Improves memory, attention, brain disease  -Lowers risk of heart disease  -May help prevent cancer  -Decreases pain  -Alleviates depression  -Delays aging and decreases risk of chronic disease  -Consume with black pepper to increase absorption    Turmeric Milk Recipe:  1 cup milk  1 tsp turmeric  1 tsp cinnamon  1 tsp grated ginger (optional)  Black pepper (boosts the anti-inflammatory properties of turmeric).  1 tsp honey   -Discussed current symptoms of pain and history of pain.  -Discussed benefits of exercise in reducing pain. -Discussed following foods that may reduce pain: 1) Ginger (especially studied for arthritis)- reduce leukotriene production to decrease inflammation 2) Blueberries- high in phytonutrients that decrease inflammation 3) Salmon- marine omega-3s reduce joint swelling and pain 4) Pumpkin seeds- reduce inflammation 5) dark chocolate- reduces inflammation 6) turmeric- reduces inflammation 7) tart cherries - reduce pain and stiffness 8) extra virgin olive oil - its compound olecanthal helps to block prostaglandins  9) chili peppers- can be eaten or applied topically via capsaicin 10) mint- helpful for headache, muscle aches, joint pain, and itching 11) garlic- reduces inflammation  Link to further information on diet for chronic pain: [email or web address].bray.[email or web address]

## 2023-07-29 ENCOUNTER — Other Ambulatory Visit: Payer: Self-pay

## 2023-08-05 ENCOUNTER — Other Ambulatory Visit: Payer: Self-pay

## 2023-08-07 ENCOUNTER — Telehealth (HOSPITAL_BASED_OUTPATIENT_CLINIC_OR_DEPARTMENT_OTHER): Payer: Self-pay | Admitting: Licensed Clinical Social Worker

## 2023-08-07 NOTE — Telephone Encounter (Signed)
H&V Care Navigation CSW Progress Note  Clinical Social Worker contacted patient by phone to f/u on missing bank statements needed for [employer]. Per notes Mikle Bosworth, Artist also has contacted pt regarding these. No answer today at [phone] when I called twice- left vm with assistance of Spanish language interpreter Elmyra Ricks [phone] . Will re-attempt as able.  Patient is participating in a Managed Medicaid Plan:  No, self pay only  SDOH Screenings   Food Insecurity: No Food Insecurity (03/30/2023)  Housing: Low Risk  (03/30/2023)  Transportation Needs: No Transportation Needs (03/30/2023)  Alcohol Screen: Low Risk  (03/30/2023)  Depression (PHQ2-9): High Risk (07/28/2023)  Financial Resource Strain: Low Risk  (03/30/2023)  Physical Activity: Unknown (03/30/2023)  Social Connections: Moderately Isolated (07/01/2023)  Stress: No Stress Concern Present (03/30/2023)  Tobacco Use: Low Risk  (07/28/2023)    Octavio Graves, MSW, LCSW Clinical Social Worker II [facility] Health Heart/Vascular Care Navigation  [phone]- work cell phone (preferred) [phone]- desk phone

## 2023-08-11 ENCOUNTER — Telehealth: Payer: Self-pay | Admitting: Licensed Clinical Social Worker

## 2023-08-11 NOTE — Telephone Encounter (Signed)
H&V Care Navigation CSW Progress Note  Clinical Social Worker  sent pt a f/u text message  to remind her of missing documents and need to submit prior to 08/20/23. Documents needed for [employer].   Patient is participating in a Managed Medicaid Plan:  No, self pay only  SDOH Screenings   Food Insecurity: No Food Insecurity (03/30/2023)  Housing: Low Risk  (03/30/2023)  Transportation Needs: No Transportation Needs (03/30/2023)  Alcohol Screen: Low Risk  (03/30/2023)  Depression (PHQ2-9): High Risk (07/28/2023)  Financial Resource Strain: Low Risk  (03/30/2023)  Physical Activity: Unknown (03/30/2023)  Social Connections: Moderately Isolated (07/01/2023)  Stress: No Stress Concern Present (03/30/2023)  Tobacco Use: Low Risk  (07/28/2023)   Octavio Graves, MSW, LCSW Clinical Social Worker II [facility] Health Heart/Vascular Care Navigation  [phone]- work cell phone (preferred) [phone]- desk phone

## 2023-08-17 ENCOUNTER — Other Ambulatory Visit (HOSPITAL_COMMUNITY): Payer: Self-pay

## 2023-08-17 ENCOUNTER — Other Ambulatory Visit: Payer: Self-pay

## 2023-08-19 ENCOUNTER — Telehealth: Payer: Self-pay | Admitting: Licensed Clinical Social Worker

## 2023-08-19 NOTE — Telephone Encounter (Signed)
H&V Care Navigation CSW Progress Note  Clinical Social Worker contacted patient by phone to f/u on CAFA approval. Was able to reach her at [phone]. Confirmed she was approved for 50% CAFA, explained how this works with previous self pay discount and how to contact billing to ensure it is applied to all bills eligible and moving forward to bring her letter of approval to all appts. No additional questions at this time.  Patient is participating in a Managed Medicaid Plan:  no, self pay only  SDOH Screenings   Food Insecurity: No Food Insecurity (03/30/2023)  Housing: Low Risk  (03/30/2023)  Transportation Needs: No Transportation Needs (03/30/2023)  Alcohol Screen: Low Risk  (03/30/2023)  Depression (PHQ2-9): High Risk (07/28/2023)  Financial Resource Strain: Low Risk  (03/30/2023)  Physical Activity: Unknown (03/30/2023)  Social Connections: Moderately Isolated (07/01/2023)  Stress: No Stress Concern Present (03/30/2023)  Tobacco Use: Low Risk  (07/28/2023)   Octavio Graves, MSW, LCSW Clinical Social Worker II [facility] Health Heart/Vascular Care Navigation  [phone]- work cell phone (preferred) [phone]- desk phone

## 2023-08-29 ENCOUNTER — Other Ambulatory Visit: Payer: Self-pay

## 2023-09-01 ENCOUNTER — Encounter: Payer: Self-pay | Admitting: Physical Medicine and Rehabilitation

## 2023-09-02 ENCOUNTER — Other Ambulatory Visit: Payer: Self-pay

## 2023-10-02 ENCOUNTER — Ambulatory Visit: Payer: Self-pay | Attending: Nurse Practitioner | Admitting: Nurse Practitioner

## 2023-10-02 ENCOUNTER — Encounter: Payer: Self-pay | Admitting: Nurse Practitioner

## 2023-10-02 VITALS — BP 122/81 | HR 77 | Ht 61.0 in | Wt 262.4 lb

## 2023-10-02 DIAGNOSIS — Z1231 Encounter for screening mammogram for malignant neoplasm of breast: Secondary | ICD-10-CM

## 2023-10-02 DIAGNOSIS — R7303 Prediabetes: Secondary | ICD-10-CM

## 2023-10-02 DIAGNOSIS — R7989 Other specified abnormal findings of blood chemistry: Secondary | ICD-10-CM

## 2023-10-02 DIAGNOSIS — D649 Anemia, unspecified: Secondary | ICD-10-CM

## 2023-10-02 LAB — POCT GLYCOSYLATED HEMOGLOBIN (HGB A1C): HbA1c, POC (prediabetic range): 5.9 % (ref 5.7–6.4)

## 2023-10-02 NOTE — Progress Notes (Signed)
Assessment & Plan:  [facility]" was seen today for medical management of chronic issues.  Diagnoses and all orders for this visit:  Prediabetes -     POCT glycosylated hemoglobin (Hb A1C)  Low serum calcium -     Basic metabolic panel -     VITAMIN D 25 Hydroxy (Vit-D Deficiency, Fractures)  Anemia, unspecified type -     CBC with Differential    Patient has been counseled on age-appropriate routine health concerns for screening and prevention. These are reviewed and up-to-date. Referrals have been placed accordingly. Immunizations are up-to-date or declined.    Subjective:   Chief Complaint  Patient presents with   Medical Management of Chronic Issues    Kathleen Savage 47 y.o. female presents to office today for prediabetes.  VRI was used to communicate directly with patient for the entire encounter including providing detailed patient instructions.    She has a past medical history of Glucose intolerance (pre-diabetes), HIV infection  and Hyperlipidemia.    Patient has been counseled on age-appropriate routine health concerns for screening and prevention. These are reviewed and up-to-date. Referrals have been placed accordingly. Immunizations are up-to-date or declined.     MAMMOGRAM: Overdue.  Referral has been placed. PAP SMEAR: Overdue.  Scheduled for next visit. Colon cancer screening: Overdue.  Awaiting completion of stool kit   A1c at goal of less than 6.5.  She is currently not on any oral diabetic medications. Lab Results  Component Value Date   HGBA1C 5.9 10/02/2023    Lab Results  Component Value Date   HGBA1C 5.7 03/31/2023  Blood pressure is well-controlled. BP Readings from Last 3 Encounters:  10/02/23 122/81  07/28/23 127/88  07/06/23 124/84    Lab Results  Component Value Date   LDLCALC 149 (H) 12/29/2022   The 10-year ASCVD risk score (Arnett DK, et al., 2019) is: 0.9%   Values used to calculate the score:     Age: 14 years      Sex: Female     Is Non-Hispanic African American: No     Diabetic: No     Tobacco smoker: No     Systolic Blood Pressure: 122 mmHg     Is BP treated: No     HDL Cholesterol: 56 mg/dL     Total Cholesterol: 221 mg/dL  Review of Systems  Constitutional:  Negative for fever, malaise/fatigue and weight loss.  HENT: Negative.  Negative for nosebleeds.   Eyes: Negative.  Negative for blurred vision, double vision and photophobia.  Respiratory: Negative.  Negative for cough and shortness of breath.   Cardiovascular: Negative.  Negative for chest pain, palpitations and leg swelling.  Gastrointestinal: Negative.  Negative for heartburn, nausea and vomiting.  Musculoskeletal: Negative.  Negative for myalgias.  Neurological: Negative.  Negative for dizziness, focal weakness, seizures and headaches.  Psychiatric/Behavioral: Negative.  Negative for suicidal ideas.     Past Medical History:  Diagnosis Date   Glucose intolerance (pre-diabetes)    HIV infection (HCC)    Hyperlipidemia     Past Surgical History:  Procedure Laterality Date   LOWER EXTREMITY VENOGRAPHY Left 05/12/2023   Procedure: LOWER EXTREMITY VENOGRAPHY;  Surgeon: Nada Libman, MD;  [facility];  Service: Cardiovascular;  Laterality: Left;   PERIPHERAL VASCULAR BALLOON ANGIOPLASTY  05/12/2023   Procedure: PERIPHERAL VASCULAR BALLOON ANGIOPLASTY;  Surgeon: Nada Libman, MD;  [facility]: [facility];  Service: Cardiovascular;;   PERIPHERAL VASCULAR THROMBECTOMY N/A  05/12/2023   Procedure: MECHANICAL THROMBECTOMY;  Surgeon: Nada Libman, MD;  [facility]: [facility];  Service: Cardiovascular;  Laterality: N/A;    Family History  Problem Relation Age of Onset   Cancer Maternal Aunt        Breast Cancer   Breast cancer Maternal Aunt    Cancer Cousin        Breast cancer - maternal cousin   Breast cancer Cousin    Cancer Cousin        breast cancer   Breast cancer Cousin    Cancer Cousin         breast cancer   Cancer Maternal Aunt        stomach cancer   Breast cancer Maternal Aunt    Hypertension Mother    Stroke Father    Cancer Sister    Uterine cancer Sister     Social History Reviewed with no changes to be made today.   Outpatient Medications Prior to Visit  Medication Sig Dispense Refill   acetaminophen-codeine (TYLENOL #3) 300-30 MG tablet Take 1-2 tablets by mouth every 8 (eight) hours as needed for moderate pain. 60 tablet 0   bictegravir-emtricitabine-tenofovir AF (BIKTARVY) 50-200-25 MG TABS tablet Take 1 tablet by mouth daily. 30 tablet 11   DICLOFENAC PO Take 100 mg by mouth daily.     gabapentin (NEURONTIN) 300 MG capsule Take 1 capsule (300 mg total) by mouth 3 (three) times daily. 90 capsule 3   rivaroxaban (XARELTO) 20 MG TABS tablet Take 1 tablet (20 mg total) by mouth daily with supper. Take with food. 90 tablet 1   topiramate (TOPAMAX) 25 MG tablet Take 1 tablet (25 mg total) by mouth at bedtime. 90 tablet 3   No facility-administered medications prior to visit.    No Known Allergies     Objective:    BP 122/81 (BP Location: Left Arm, Patient Position: Sitting, Cuff Size: Large)   Pulse 77   Ht 5\' 1"  (1.549 m)   Wt 262 lb 6.4 oz (119 kg)   LMP 09/18/2023   SpO2 97%   BMI 49.58 kg/m  Wt Readings from Last 3 Encounters:  10/02/23 262 lb 6.4 oz (119 kg)  07/28/23 258 lb 3.2 oz (117.1 kg)  07/06/23 255 lb (115.7 kg)    Physical Exam Vitals and nursing note reviewed.  Constitutional:      Appearance: She is well-developed.  HENT:     Head: Normocephalic and atraumatic.  Cardiovascular:     Rate and Rhythm: Normal rate and regular rhythm.     Heart sounds: Normal heart sounds. No murmur heard.    No friction rub. No gallop.  Pulmonary:     Effort: Pulmonary effort is normal. No tachypnea or respiratory distress.     Breath sounds: Normal breath sounds. No decreased breath sounds, wheezing, rhonchi or rales.  Chest:     Chest  wall: No tenderness.  Abdominal:     General: Bowel sounds are normal.     Palpations: Abdomen is soft.  Musculoskeletal:        General: Normal range of motion.     Cervical back: Normal range of motion.  Skin:    General: Skin is warm and dry.  Neurological:     Mental Status: She is alert and oriented to person, place, and time.     Coordination: Coordination normal.  Psychiatric:        Behavior: Behavior normal. Behavior is cooperative.  Thought Content: Thought content normal.        Judgment: Judgment normal.          Patient has been counseled extensively about nutrition and exercise as well as the importance of adherence with medications and regular follow-up. The patient was given clear instructions to go to ER or return to medical center if symptoms don't improve, worsen or new problems develop. The patient verbalized understanding.   Follow-up: Return in about 3 months (around 01/02/2024) for PAP SMEAR.   Claiborne Rigg, FNP-BC [facility] [place], [place] [phone]   10/02/2023, 1:50 PM

## 2023-10-03 LAB — CBC WITH DIFFERENTIAL/PLATELET
Basophils Absolute: 0.1 10*3/uL (ref 0.0–0.2)
Basos: 1 %
EOS (ABSOLUTE): 0.3 10*3/uL (ref 0.0–0.4)
Eos: 5 %
Hematocrit: 37 % (ref 34.0–46.6)
Hemoglobin: 12 g/dL (ref 11.1–15.9)
Immature Grans (Abs): 0 10*3/uL (ref 0.0–0.1)
Immature Granulocytes: 0 %
Lymphocytes Absolute: 2.3 10*3/uL (ref 0.7–3.1)
Lymphs: 32 %
MCH: 28.8 pg (ref 26.6–33.0)
MCHC: 32.4 g/dL (ref 31.5–35.7)
MCV: 89 fL (ref 79–97)
Monocytes Absolute: 0.5 10*3/uL (ref 0.1–0.9)
Monocytes: 7 %
Neutrophils Absolute: 3.9 10*3/uL (ref 1.4–7.0)
Neutrophils: 55 %
Platelets: 374 10*3/uL (ref 150–450)
RBC: 4.16 x10E6/uL (ref 3.77–5.28)
RDW: 13.3 % (ref 11.7–15.4)
WBC: 7.1 10*3/uL (ref 3.4–10.8)

## 2023-10-03 LAB — BASIC METABOLIC PANEL
BUN/Creatinine Ratio: 17 (ref 9–23)
BUN: 12 mg/dL (ref 6–24)
CO2: 22 mmol/L (ref 20–29)
Calcium: 8.8 mg/dL (ref 8.7–10.2)
Chloride: 104 mmol/L (ref 96–106)
Creatinine, Ser: 0.71 mg/dL (ref 0.57–1.00)
Glucose: 118 mg/dL — ABNORMAL HIGH (ref 70–99)
Potassium: 4.6 mmol/L (ref 3.5–5.2)
Sodium: 139 mmol/L (ref 134–144)
eGFR: 106 mL/min/{1.73_m2} (ref >59)

## 2023-10-03 LAB — VITAMIN D 25 HYDROXY (VIT D DEFICIENCY, FRACTURES): Vit D, 25-Hydroxy: 15.5 ng/mL — ABNORMAL LOW (ref 30.0–100.0)

## 2023-10-04 ENCOUNTER — Other Ambulatory Visit: Payer: Self-pay | Admitting: Nurse Practitioner

## 2023-10-04 DIAGNOSIS — E559 Vitamin D deficiency, unspecified: Secondary | ICD-10-CM

## 2023-10-04 MED ORDER — VITAMIN D (ERGOCALCIFEROL) 1.25 MG (50000 UNIT) PO CAPS
50000.0000 [IU] | ORAL_CAPSULE | ORAL | 0 refills | Status: DC
Start: 2023-10-04 — End: 2024-01-12

## 2023-11-11 ENCOUNTER — Other Ambulatory Visit: Payer: Self-pay | Admitting: Nurse Practitioner

## 2023-11-11 DIAGNOSIS — G8929 Other chronic pain: Secondary | ICD-10-CM

## 2023-11-11 DIAGNOSIS — E559 Vitamin D deficiency, unspecified: Secondary | ICD-10-CM

## 2023-11-12 ENCOUNTER — Other Ambulatory Visit: Payer: Self-pay

## 2023-11-15 MED ORDER — ACETAMINOPHEN-CODEINE 300-30 MG PO TABS
1.0000 | ORAL_TABLET | Freq: Three times a day (TID) | ORAL | 0 refills | Status: DC | PRN
Start: 2023-11-15 — End: 2024-06-03
  Filled 2023-11-15 – 2023-12-17 (×6): qty 60, 10d supply, fill #0

## 2023-11-16 ENCOUNTER — Other Ambulatory Visit: Payer: Self-pay | Admitting: *Deleted

## 2023-11-16 ENCOUNTER — Other Ambulatory Visit: Payer: Self-pay

## 2023-11-16 ENCOUNTER — Other Ambulatory Visit (HOSPITAL_COMMUNITY): Payer: Self-pay

## 2023-11-16 DIAGNOSIS — Z5181 Encounter for therapeutic drug level monitoring: Secondary | ICD-10-CM

## 2023-11-16 DIAGNOSIS — I82412 Acute embolism and thrombosis of left femoral vein: Secondary | ICD-10-CM

## 2023-11-16 DIAGNOSIS — I82422 Acute embolism and thrombosis of left iliac vein: Secondary | ICD-10-CM

## 2023-11-17 ENCOUNTER — Inpatient Hospital Stay: Payer: Self-pay | Admitting: Hematology and Oncology

## 2023-11-17 ENCOUNTER — Inpatient Hospital Stay: Payer: Self-pay | Attending: Hematology and Oncology

## 2023-11-19 ENCOUNTER — Other Ambulatory Visit (HOSPITAL_COMMUNITY): Payer: Self-pay

## 2023-11-27 ENCOUNTER — Other Ambulatory Visit (HOSPITAL_COMMUNITY): Payer: Self-pay | Admitting: Student-PharmD

## 2023-11-27 DIAGNOSIS — I82422 Acute embolism and thrombosis of left iliac vein: Secondary | ICD-10-CM

## 2023-11-30 ENCOUNTER — Other Ambulatory Visit: Payer: Self-pay

## 2023-12-01 ENCOUNTER — Encounter: Payer: Self-pay | Admitting: *Deleted

## 2023-12-01 ENCOUNTER — Other Ambulatory Visit: Payer: Self-pay

## 2023-12-01 ENCOUNTER — Telehealth: Payer: Self-pay | Admitting: *Deleted

## 2023-12-01 ENCOUNTER — Encounter: Payer: Self-pay | Admitting: Pharmacist

## 2023-12-01 ENCOUNTER — Ambulatory Visit: Payer: Self-pay | Admitting: Physical Medicine and Rehabilitation

## 2023-12-01 ENCOUNTER — Other Ambulatory Visit (HOSPITAL_COMMUNITY): Payer: Self-pay

## 2023-12-01 NOTE — Telephone Encounter (Signed)
Per MD review of request for xarelto refill - noted pt was to see MD in early January with recommendation for 6 months of blood thinner therapy.  Pt was to see MD and obtain additional lab work to evaluate clotting issues and possible need for further therapy in early January - with appt noted as no show.  Per MD this RN declined refill and left a message on pt's voice mail to call to this RN to follow up.  This note will also be sent to the patient's My Chart account for communication and need to schedule appropriate follow up appointment.

## 2023-12-04 ENCOUNTER — Telehealth: Payer: Self-pay | Admitting: Hematology and Oncology

## 2023-12-04 NOTE — Telephone Encounter (Signed)
Rescheduled appointments per 1/21 scheduling message. Patient was made aware of the made appointments via interpreter phone interaction. Patient will be mailed an appointment reminder and is active on MyChart.

## 2023-12-17 ENCOUNTER — Inpatient Hospital Stay: Payer: Self-pay | Attending: Hematology and Oncology

## 2023-12-17 ENCOUNTER — Encounter: Payer: Self-pay | Admitting: Hematology and Oncology

## 2023-12-17 ENCOUNTER — Other Ambulatory Visit: Payer: Self-pay

## 2023-12-17 ENCOUNTER — Other Ambulatory Visit (HOSPITAL_COMMUNITY): Payer: Self-pay

## 2023-12-17 ENCOUNTER — Other Ambulatory Visit (HOSPITAL_COMMUNITY): Payer: Self-pay | Admitting: Student-PharmD

## 2023-12-17 ENCOUNTER — Inpatient Hospital Stay (HOSPITAL_BASED_OUTPATIENT_CLINIC_OR_DEPARTMENT_OTHER): Payer: Self-pay | Admitting: Hematology and Oncology

## 2023-12-17 VITALS — BP 149/80 | HR 88 | Temp 98.1°F | Resp 17 | Wt 262.4 lb

## 2023-12-17 DIAGNOSIS — I82412 Acute embolism and thrombosis of left femoral vein: Secondary | ICD-10-CM

## 2023-12-17 DIAGNOSIS — Z86718 Personal history of other venous thrombosis and embolism: Secondary | ICD-10-CM | POA: Insufficient documentation

## 2023-12-17 DIAGNOSIS — M79604 Pain in right leg: Secondary | ICD-10-CM | POA: Insufficient documentation

## 2023-12-17 DIAGNOSIS — E785 Hyperlipidemia, unspecified: Secondary | ICD-10-CM | POA: Insufficient documentation

## 2023-12-17 DIAGNOSIS — B20 Human immunodeficiency virus [HIV] disease: Secondary | ICD-10-CM | POA: Insufficient documentation

## 2023-12-17 DIAGNOSIS — Z7901 Long term (current) use of anticoagulants: Secondary | ICD-10-CM | POA: Insufficient documentation

## 2023-12-17 DIAGNOSIS — Z5181 Encounter for therapeutic drug level monitoring: Secondary | ICD-10-CM

## 2023-12-17 DIAGNOSIS — I82422 Acute embolism and thrombosis of left iliac vein: Secondary | ICD-10-CM

## 2023-12-17 DIAGNOSIS — Z79624 Long term (current) use of inhibitors of nucleotide synthesis: Secondary | ICD-10-CM | POA: Insufficient documentation

## 2023-12-17 DIAGNOSIS — R079 Chest pain, unspecified: Secondary | ICD-10-CM | POA: Insufficient documentation

## 2023-12-17 DIAGNOSIS — M25569 Pain in unspecified knee: Secondary | ICD-10-CM | POA: Insufficient documentation

## 2023-12-17 DIAGNOSIS — Z803 Family history of malignant neoplasm of breast: Secondary | ICD-10-CM | POA: Insufficient documentation

## 2023-12-17 DIAGNOSIS — Z79899 Other long term (current) drug therapy: Secondary | ICD-10-CM | POA: Insufficient documentation

## 2023-12-17 DIAGNOSIS — R059 Cough, unspecified: Secondary | ICD-10-CM | POA: Insufficient documentation

## 2023-12-17 LAB — CBC WITH DIFFERENTIAL (CANCER CENTER ONLY)
Abs Immature Granulocytes: 0.01 10*3/uL (ref 0.00–0.07)
Basophils Absolute: 0.1 10*3/uL (ref 0.0–0.1)
Basophils Relative: 1 %
Eosinophils Absolute: 0.2 10*3/uL (ref 0.0–0.5)
Eosinophils Relative: 4 %
HCT: 36.3 % (ref 36.0–46.0)
Hemoglobin: 12 g/dL (ref 12.0–15.0)
Immature Granulocytes: 0 %
Lymphocytes Relative: 33 %
Lymphs Abs: 1.7 10*3/uL (ref 0.7–4.0)
MCH: 29.2 pg (ref 26.0–34.0)
MCHC: 33.1 g/dL (ref 30.0–36.0)
MCV: 88.3 fL (ref 80.0–100.0)
Monocytes Absolute: 0.3 10*3/uL (ref 0.1–1.0)
Monocytes Relative: 6 %
Neutro Abs: 3 10*3/uL (ref 1.7–7.7)
Neutrophils Relative %: 56 %
Platelet Count: 334 10*3/uL (ref 150–400)
RBC: 4.11 MIL/uL (ref 3.87–5.11)
RDW: 13.2 % (ref 11.5–15.5)
WBC Count: 5.4 10*3/uL (ref 4.0–10.5)
nRBC: 0 % (ref 0.0–0.2)

## 2023-12-17 LAB — CMP (CANCER CENTER ONLY)
ALT: 27 U/L (ref 0–44)
AST: 20 U/L (ref 15–41)
Albumin: 3.8 g/dL (ref 3.5–5.0)
Alkaline Phosphatase: 46 U/L (ref 38–126)
Anion gap: 4 — ABNORMAL LOW (ref 5–15)
BUN: 11 mg/dL (ref 6–20)
CO2: 27 mmol/L (ref 22–32)
Calcium: 8.3 mg/dL — ABNORMAL LOW (ref 8.9–10.3)
Chloride: 107 mmol/L (ref 98–111)
Creatinine: 0.67 mg/dL (ref 0.44–1.00)
GFR, Estimated: >60 mL/min (ref >60)
Glucose, Bld: 122 mg/dL — ABNORMAL HIGH (ref 70–99)
Potassium: 4.2 mmol/L (ref 3.5–5.1)
Sodium: 138 mmol/L (ref 135–145)
Total Bilirubin: 0.4 mg/dL (ref 0.0–1.2)
Total Protein: 7.2 g/dL (ref 6.5–8.1)

## 2023-12-17 NOTE — Progress Notes (Signed)
 [facility] CONSULT NOTE  Patient Care Team: Theotis Haze ORN, NP as PCP - General (Nurse Practitioner) Elaine Rush, MD (Inactive) as PCP - Infectious Diseases (Infectious Diseases)  CHIEF COMPLAINTS/PURPOSE OF CONSULTATION:  Acute DVT  ASSESSMENT & PLAN:   Acute deep vein thrombosis (DVT) of iliac vein of left lower extremity (HCC) This is a very pleasant 48 year old female patient with past medical history significant for HIV on antiretroviral therapy, DVT on anticoagulation who is here for follow-up.  Deep Vein Thrombosis (DVT) Completed six months of anticoagulation therapy for a previous DVT. Currently asymptomatic with no signs of active clotting. -Discontinue anticoagulation therapy for one week. -Perform a clotting workup after one week off anticoagulation therapy. EXTR Right Leg Pain Reports of right leg and knee pain, possibly due to sciatica. No signs of active clotting. -Refer to orthopedic specialist for further evaluation and management. Last imaging suggested sacroiliitis  General Health Maintenance -Overdue for mammogram. Patient to schedule appointment for screening. -Follow-up appointment in 7-10 days for lab work. Telephone appt in 2 weeks.   All her questions were answered to the best my knowledge.  Thank you for consulting us  in the care of this patient. Please do not hesitate to contact us  with any additional questions or concerns.  HISTORY OF PRESENTING ILLNESS:  Discussed the use of AI scribe software for clinical note transcription with the patient, who gave verbal consent to proceed.  History of Present Illness         Kathleen Savage is a 48 year old female who presents for follow-up regarding her blood clot and leg pain.  She has a history of a blood clot for which she was prescribed blood thinners for six months. She has completed the six-month course but continues to take the medication as she has some left. A  previous ultrasound showed an older clot, and she underwent a CT scan to rule out underlying cancer as a cause for the clot. Further blood work is planned to investigate potential clotting disorders. No swelling in her legs, and both legs appear equally swollen. Occasional symptoms of cough, chest pain, or shortness of breath are associated with a past history of bronchitis.  She is experiencing persistent pain in her right leg, which she attributes to sciatic nerve issues. The pain is localized to the knee and leg area and is unrelated to a previous blood clot in the other leg. She mentions a diagnosis of sacroiliitis, which may be contributing to her right-sided pain. No recent visits to an orthopedic doctor or physical therapist.  She has not had a mammogram in over a year and acknowledges that it is overdue.  Rest of the pertinent 10 point ROS reviewed and neg.  MEDICAL HISTORY:  Past Medical History:  Diagnosis Date   Glucose intolerance (pre-diabetes)    HIV infection (HCC)    Hyperlipidemia     SURGICAL HISTORY: Past Surgical History:  Procedure Laterality Date   LOWER EXTREMITY VENOGRAPHY Left 05/12/2023   Procedure: LOWER EXTREMITY VENOGRAPHY;  Surgeon: Serene Gaile ORN, MD;  Location: [facility];  Service: Cardiovascular;  Laterality: Left;   PERIPHERAL VASCULAR BALLOON ANGIOPLASTY  05/12/2023   Procedure: PERIPHERAL VASCULAR BALLOON ANGIOPLASTY;  Surgeon: Serene Gaile ORN, MD;  Location: [facility];  Service: Cardiovascular;;   PERIPHERAL VASCULAR THROMBECTOMY N/A 05/12/2023   Procedure: MECHANICAL THROMBECTOMY;  Surgeon: Serene Gaile ORN, MD;  Location: [facility];  Service: Cardiovascular;  Laterality: N/A;  SOCIAL HISTORY: Social History   Socioeconomic History   Marital status: Married    Spouse name: Not on file   Number of children: 2   Years of education: Not on file   Highest education level: 9th grade  Occupational History   Not on file   Tobacco Use   Smoking status: Never   Smokeless tobacco: Never  Vaping Use   Vaping status: Never Used  Substance and Sexual Activity   Alcohol use: Yes    Alcohol/week: 3.0 standard drinks of alcohol    Types: 1 Glasses of wine, 1 Cans of beer, 1 Shots of liquor per week    Comment: occasional   Drug use: No   Sexual activity: Yes    Partners: Male    Birth control/protection: Condom    Comment: given condoms  Other Topics Concern   Not on file  Social History Narrative   Not on file   Social Drivers of Health   Financial Resource Strain: Low Risk  (10/01/2023)   Overall Financial Resource Strain (CARDIA)    Difficulty of Paying Living Expenses: Not very hard  Food Insecurity: No Food Insecurity (10/01/2023)   Hunger Vital Sign    Worried About Running Out of Food in the Last Year: Never true    Ran Out of Food in the Last Year: Never true  Transportation Needs: No Transportation Needs (10/01/2023)   PRAPARE - Administrator, Civil Service (Medical): No    Lack of Transportation (Non-Medical): No  Physical Activity: Unknown (10/01/2023)   Exercise Vital Sign    Days of Exercise per Week: 0 days    Minutes of Exercise per Session: Not on file  Stress: No Stress Concern Present (10/01/2023)   [employer] of Occupational Health - Occupational Stress Questionnaire    Feeling of Stress : Only a little  Social Connections: Moderately Isolated (10/01/2023)   Social Connection and Isolation Panel [NHANES]    Frequency of Communication with Friends and Family: More than three times a week    Frequency of Social Gatherings with Friends and Family: Once a week    Attends Religious Services: Never    Database Administrator or Organizations: No    Attends Banker Meetings: Never    Marital Status: Married  Catering Manager Violence: Not At Risk (10/02/2023)   Humiliation, Afraid, Rape, and Kick questionnaire    Fear of Current or Ex-Partner: No     Emotionally Abused: No    Physically Abused: No    Sexually Abused: No    FAMILY HISTORY: Family History  Problem Relation Age of Onset   Cancer Maternal Aunt        Breast Cancer   Breast cancer Maternal Aunt    Cancer Cousin        Breast cancer - maternal cousin   Breast cancer Cousin    Cancer Cousin        breast cancer   Breast cancer Cousin    Cancer Cousin        breast cancer   Cancer Maternal Aunt        stomach cancer   Breast cancer Maternal Aunt    Hypertension Mother    Stroke Father    Cancer Sister    Uterine cancer Sister     ALLERGIES:  has no known allergies.  MEDICATIONS:  Current Outpatient Medications  Medication Sig Dispense Refill   acetaminophen -codeine  (TYLENOL  #3) 300-30 MG tablet Take 1-2  tablets by mouth every 8 (eight) hours as needed for moderate pain (pain score 4-6). 60 tablet 0   bictegravir-emtricitabine-tenofovir  AF (BIKTARVY ) 50-200-25 MG TABS tablet Take 1 tablet by mouth daily. 30 tablet 11   DICLOFENAC  PO Take 100 mg by mouth daily.     gabapentin  (NEURONTIN ) 300 MG capsule Take 1 capsule (300 mg total) by mouth 3 (three) times daily. 90 capsule 3   rivaroxaban  (XARELTO ) 20 MG TABS tablet Take 1 tablet (20 mg total) by mouth daily with supper. Take with food. 90 tablet 1   topiramate  (TOPAMAX ) 25 MG tablet Take 1 tablet (25 mg total) by mouth at bedtime. 90 tablet 3   Vitamin D , Ergocalciferol , (DRISDOL ) 1.25 MG (50000 UNIT) CAPS capsule Take 1 capsule (50,000 Units total) by mouth once a week. 12 capsule 0   No current facility-administered medications for this visit.     PHYSICAL EXAMINATION: ECOG PERFORMANCE STATUS: 0 - Asymptomatic  Vitals:   12/17/23 0940  BP: (!) 149/80  Pulse: 88  Resp: 17  Temp: 98.1 F (36.7 C)  SpO2: 97%    Filed Weights   12/17/23 0940  Weight: 262 lb 6.4 oz (119 kg)   Neck: No regional adenopathy. Chest: CTA bilaterally Heart: RRR Abdomen: soft non tender, non distended. No  organomegaly No LE edema.   LABORATORY DATA:  I have reviewed the data as listed Lab Results  Component Value Date   WBC 5.4 12/17/2023   HGB 12.0 12/17/2023   HCT 36.3 12/17/2023   MCV 88.3 12/17/2023   PLT 334 12/17/2023     Chemistry      Component Value Date/Time   NA 138 12/17/2023 0909   NA 139 10/02/2023 1035   K 4.2 12/17/2023 0909   CL 107 12/17/2023 0909   CO2 27 12/17/2023 0909   BUN 11 12/17/2023 0909   BUN 12 10/02/2023 1035   CREATININE 0.67 12/17/2023 0909   CREATININE 0.59 06/16/2023 0851      Component Value Date/Time   CALCIUM  8.3 (L) 12/17/2023 0909   ALKPHOS 46 12/17/2023 0909   AST 20 12/17/2023 0909   ALT 27 12/17/2023 0909   BILITOT 0.4 12/17/2023 0909       RADIOGRAPHIC STUDIES: I have personally reviewed the radiological images as listed and agreed with the findings in the report. No results found.  Total time: 30 min  All questions were answered. The patient knows to call the clinic with any problems, questions or concerns.   Amber Stalls, MD 12/17/2023 11:26 AM

## 2023-12-17 NOTE — Assessment & Plan Note (Signed)
 This is a very pleasant 48 year old female patient with past medical history significant for HIV on antiretroviral therapy, DVT on anticoagulation who is here for follow-up.  Deep Vein Thrombosis (DVT) Completed six months of anticoagulation therapy for a previous DVT. Currently asymptomatic with no signs of active clotting. -Discontinue anticoagulation therapy for one week. -Perform a clotting workup after one week off anticoagulation therapy. EXTR Right Leg Pain Reports of right leg and knee pain, possibly due to sciatica. No signs of active clotting. -Refer to orthopedic specialist for further evaluation and management. Last imaging suggested sacroiliitis  General Health Maintenance -Overdue for mammogram. Patient to schedule appointment for screening. -Follow-up appointment in 7-10 days for lab work. Telephone appt in 2 weeks.

## 2023-12-21 ENCOUNTER — Other Ambulatory Visit (HOSPITAL_COMMUNITY): Payer: Self-pay

## 2023-12-25 ENCOUNTER — Other Ambulatory Visit: Payer: Self-pay | Admitting: *Deleted

## 2023-12-25 ENCOUNTER — Inpatient Hospital Stay: Payer: Self-pay

## 2023-12-25 DIAGNOSIS — I82422 Acute embolism and thrombosis of left iliac vein: Secondary | ICD-10-CM

## 2023-12-25 DIAGNOSIS — I82412 Acute embolism and thrombosis of left femoral vein: Secondary | ICD-10-CM

## 2023-12-25 LAB — ANTITHROMBIN III: AntiThromb III Func: 96 % (ref 75–120)

## 2023-12-26 LAB — LUPUS ANTICOAGULANT PANEL
DRVVT: 27.9 s (ref 0.0–47.0)
PTT Lupus Anticoagulant: 30.4 s (ref 0.0–43.5)

## 2023-12-26 LAB — PROTEIN S ACTIVITY: Protein S Activity: 84 % (ref 63–140)

## 2023-12-26 LAB — PROTEIN C ACTIVITY: Protein C Activity: 80 % (ref 73–180)

## 2023-12-27 LAB — BETA-2-GLYCOPROTEIN I ABS, IGG/M/A
Beta-2 Glyco I IgG: <9 GPI IgG units (ref 0–20)
Beta-2-Glycoprotein I IgA: <9 GPI IgA units (ref 0–25)
Beta-2-Glycoprotein I IgM: <9 GPI IgM units (ref 0–32)

## 2023-12-27 LAB — CARDIOLIPIN ANTIBODIES, IGG, IGM, IGA
Anticardiolipin IgA: <9 [APL'U]/mL (ref 0–11)
Anticardiolipin IgG: <9 [GPL'U]/mL (ref 0–14)
Anticardiolipin IgM: <9 [MPL'U]/mL (ref 0–12)

## 2023-12-28 NOTE — Progress Notes (Unsigned)
    Aleen Sells D.[facility] [address] [place] [address] Phone: [phone]   Assessment and Plan:     There are no diagnoses linked to this encounter.  ***   Pertinent previous records reviewed include ***    Follow Up: ***     Subjective:   I, Sadaf Przybysz, am serving as a Neurosurgeon for Doctor Richardean Sale  Chief Complaint: left leg pain   HPI:   12/29/2023 Patient is a 48 year old female with left leg pain. Patient states   Relevant Historical Information: ***  Additional pertinent review of systems negative.   Current Outpatient Medications:    acetaminophen-codeine (TYLENOL #3) 300-30 MG tablet, Take 1-2 tablets by mouth every 8 (eight) hours as needed for moderate pain (pain score 4-6)., Disp: 60 tablet, Rfl: 0   bictegravir-emtricitabine-tenofovir AF (BIKTARVY) 50-200-25 MG TABS tablet, Take 1 tablet by mouth daily., Disp: 30 tablet, Rfl: 11   DICLOFENAC PO, Take 100 mg by mouth daily., Disp: , Rfl:    gabapentin (NEURONTIN) 300 MG capsule, Take 1 capsule (300 mg total) by mouth 3 (three) times daily., Disp: 90 capsule, Rfl: 3   rivaroxaban (XARELTO) 20 MG TABS tablet, Take 1 tablet (20 mg total) by mouth daily with supper. Take with food., Disp: 90 tablet, Rfl: 1   topiramate (TOPAMAX) 25 MG tablet, Take 1 tablet (25 mg total) by mouth at bedtime., Disp: 90 tablet, Rfl: 3   Vitamin D, Ergocalciferol, (DRISDOL) 1.25 MG (50000 UNIT) CAPS capsule, Take 1 capsule (50,000 Units total) by mouth once a week., Disp: 12 capsule, Rfl: 0   Objective:     There were no vitals filed for this visit.    There is no height or weight on file to calculate BMI.    Physical Exam:    ***   Electronically signed by:  Aleen Sells D.[facility] 7:44 AM 12/28/23

## 2023-12-29 ENCOUNTER — Ambulatory Visit (INDEPENDENT_AMBULATORY_CARE_PROVIDER_SITE_OTHER): Payer: Self-pay

## 2023-12-29 ENCOUNTER — Other Ambulatory Visit: Payer: Self-pay

## 2023-12-29 ENCOUNTER — Ambulatory Visit (INDEPENDENT_AMBULATORY_CARE_PROVIDER_SITE_OTHER): Payer: Self-pay | Admitting: Sports Medicine

## 2023-12-29 VITALS — HR 60 | Ht 61.0 in | Wt 260.0 lb

## 2023-12-29 DIAGNOSIS — M25552 Pain in left hip: Secondary | ICD-10-CM

## 2023-12-29 DIAGNOSIS — M25551 Pain in right hip: Secondary | ICD-10-CM

## 2023-12-29 DIAGNOSIS — M5442 Lumbago with sciatica, left side: Secondary | ICD-10-CM

## 2023-12-29 DIAGNOSIS — M5441 Lumbago with sciatica, right side: Secondary | ICD-10-CM

## 2023-12-29 DIAGNOSIS — M79604 Pain in right leg: Secondary | ICD-10-CM

## 2023-12-29 DIAGNOSIS — M542 Cervicalgia: Secondary | ICD-10-CM

## 2023-12-29 DIAGNOSIS — G8929 Other chronic pain: Secondary | ICD-10-CM

## 2023-12-29 MED ORDER — MELOXICAM 15 MG PO TABS
15.0000 mg | ORAL_TABLET | Freq: Every day | ORAL | 0 refills | Status: DC
  Filled 2023-12-29: qty 21, 21d supply, fill #0

## 2023-12-29 NOTE — Patient Instructions (Addendum)
-   Start meloxicam 15 mg daily x3 weeks. May use remaining NSAID as needed once daily for pain control.  Do not to use additional over-the-counter NSAIDs (ibuprofen, naproxen, Advil, Aleve) while taking prescription NSAIDs.  May use Tylenol [phone] mg 2 to 3 times a day for breakthrough pain. HEP  PT referral  4 week follow up

## 2023-12-30 ENCOUNTER — Other Ambulatory Visit: Payer: Self-pay

## 2023-12-31 ENCOUNTER — Inpatient Hospital Stay (HOSPITAL_BASED_OUTPATIENT_CLINIC_OR_DEPARTMENT_OTHER): Payer: Self-pay | Admitting: Hematology and Oncology

## 2023-12-31 ENCOUNTER — Encounter: Payer: Self-pay | Admitting: Physical Medicine and Rehabilitation

## 2023-12-31 DIAGNOSIS — I82422 Acute embolism and thrombosis of left iliac vein: Secondary | ICD-10-CM

## 2023-12-31 NOTE — Progress Notes (Signed)
 [facility] CONSULT NOTE  Patient Care Team: Claiborne Rigg, NP as PCP - General (Nurse Practitioner) Cliffton Asters, MD (Inactive) as PCP - Infectious Diseases (Infectious Diseases)  CHIEF COMPLAINTS/PURPOSE OF CONSULTATION:  Acute DVT  ASSESSMENT & PLAN:   Acute deep vein thrombosis (DVT) of iliac vein of left lower extremity (HCC) This is a very pleasant 48 year old female patient with past medical history significant for HIV on antiretroviral therapy, DVT on anticoagulation who is here for telephone follow-up.  Deep Vein Thrombosis (DVT) Completed six months of anticoagulation therapy for a previous DVT.  She is here to review her hypercoagulable workup.  So far all hypercoagulable workup was negative Factor V Leiden and prothrombin gene mutation is still pending. If this hypercoagulable workup also comes back normal, she can discontinue anticoagulation.  She clearly understands that if she were to have another blood clot, she should consider lifetime anticoagulation.  She is well aware of her symptoms and signs of DVT and the risk factors for this. She can return to hematology as needed for follow up.  HISTORY OF PRESENTING ILLNESS:  Discussed the use of AI scribe software for clinical note transcription with the patient, who gave verbal consent to proceed.  History of Present Illness         Kathleen Perez-Fuerte "[name]" is a 48 year old female who presents for follow-up regarding her blood clot and leg pain.  She has a history of a blood clot for which she was prescribed blood thinners for six months. She has completed the six-month course. She is here for a follow up visit to review hypercoag work up. She is doing quite well.  She had no major issues.  She went to see a chiropractor and they are hoping that she can come off of anticoagulation so she can do some treatments with them. No other concerns reported.  MEDICAL HISTORY:  Past Medical History:   Diagnosis Date   Glucose intolerance (pre-diabetes)    HIV infection (HCC)    Hyperlipidemia     SURGICAL HISTORY: Past Surgical History:  Procedure Laterality Date   LOWER EXTREMITY VENOGRAPHY Left 05/12/2023   Procedure: LOWER EXTREMITY VENOGRAPHY;  Surgeon: Nada Libman, MD;  Location: [facility];  Service: Cardiovascular;  Laterality: Left;   PERIPHERAL VASCULAR BALLOON ANGIOPLASTY  05/12/2023   Procedure: PERIPHERAL VASCULAR BALLOON ANGIOPLASTY;  Surgeon: Nada Libman, MD;  Location: [facility];  Service: Cardiovascular;;   PERIPHERAL VASCULAR THROMBECTOMY N/A 05/12/2023   Procedure: MECHANICAL THROMBECTOMY;  Surgeon: Nada Libman, MD;  Location: [facility];  Service: Cardiovascular;  Laterality: N/A;    SOCIAL HISTORY: Social History   Socioeconomic History   Marital status: Married    Spouse name: Not on file   Number of children: 2   Years of education: Not on file   Highest education level: 9th grade  Occupational History   Not on file  Tobacco Use   Smoking status: Never   Smokeless tobacco: Never  Vaping Use   Vaping status: Never Used  Substance and Sexual Activity   Alcohol use: Yes    Alcohol/week: 3.0 standard drinks of alcohol    Types: 1 Glasses of wine, 1 Cans of beer, 1 Shots of liquor per week    Comment: occasional   Drug use: No   Sexual activity: Yes    Partners: Male    Birth control/protection: Condom    Comment: given condoms  Other Topics  Concern   Not on file  Social History Narrative   Not on file   Social Drivers of Health   Financial Resource Strain: Low Risk  (10/01/2023)   Overall Financial Resource Strain (CARDIA)    Difficulty of Paying Living Expenses: Not very hard  Food Insecurity: No Food Insecurity (10/01/2023)   Hunger Vital Sign    Worried About Running Out of Food in the Last Year: Never true    Ran Out of Food in the Last Year: Never true  Transportation Needs: No Transportation Needs  (10/01/2023)   PRAPARE - Administrator, Civil Service (Medical): No    Lack of Transportation (Non-Medical): No  Physical Activity: Unknown (10/01/2023)   Exercise Vital Sign    Days of Exercise per Week: 0 days    Minutes of Exercise per Session: Not on file  Stress: No Stress Concern Present (10/01/2023)   [employer] of Occupational Health - Occupational Stress Questionnaire    Feeling of Stress : Only a little  Social Connections: Moderately Isolated (10/01/2023)   Social Connection and Isolation Panel [NHANES]    Frequency of Communication with Friends and Family: More than three times a week    Frequency of Social Gatherings with Friends and Family: Once a week    Attends Religious Services: Never    Database administrator or Organizations: No    Attends Banker Meetings: Never    Marital Status: Married  Catering manager Violence: Not At Risk (10/02/2023)   Humiliation, Afraid, Rape, and Kick questionnaire    Fear of Current or Ex-Partner: No    Emotionally Abused: No    Physically Abused: No    Sexually Abused: No    FAMILY HISTORY: Family History  Problem Relation Age of Onset   Cancer Maternal Aunt        Breast Cancer   Breast cancer Maternal Aunt    Cancer Cousin        Breast cancer - maternal cousin   Breast cancer Cousin    Cancer Cousin        breast cancer   Breast cancer Cousin    Cancer Cousin        breast cancer   Cancer Maternal Aunt        stomach cancer   Breast cancer Maternal Aunt    Hypertension Mother    Stroke Father    Cancer Sister    Uterine cancer Sister     ALLERGIES:  has no known allergies.  MEDICATIONS:  Current Outpatient Medications  Medication Sig Dispense Refill   acetaminophen-codeine (TYLENOL #3) 300-30 MG tablet Take 1-2 tablets by mouth every 8 (eight) hours as needed for moderate pain (pain score 4-6). 60 tablet 0   bictegravir-emtricitabine-tenofovir AF (BIKTARVY) 50-200-25 MG TABS  tablet Take 1 tablet by mouth daily. 30 tablet 11   DICLOFENAC PO Take 100 mg by mouth daily.     gabapentin (NEURONTIN) 300 MG capsule Take 1 capsule (300 mg total) by mouth 3 (three) times daily. 90 capsule 3   meloxicam (MOBIC) 15 MG tablet Take 1 tablet (15 mg total) by mouth daily. 21 tablet 0   rivaroxaban (XARELTO) 20 MG TABS tablet Take 1 tablet (20 mg total) by mouth daily with supper. Take with food. 90 tablet 1   topiramate (TOPAMAX) 25 MG tablet Take 1 tablet (25 mg total) by mouth at bedtime. 90 tablet 3   Vitamin D, Ergocalciferol, (DRISDOL) 1.25 MG (50000 UNIT)  CAPS capsule Take 1 capsule (50,000 Units total) by mouth once a week. 12 capsule 0   No current facility-administered medications for this visit.     PHYSICAL EXAMINATION: ECOG PERFORMANCE STATUS: 0 - Asymptomatic  LMP 12/14/2023 (Approximate)    PE deferred telephone visit.  LABORATORY DATA:  I have reviewed the data as listed Lab Results  Component Value Date   WBC 5.4 12/17/2023   HGB 12.0 12/17/2023   HCT 36.3 12/17/2023   MCV 88.3 12/17/2023   PLT 334 12/17/2023     Chemistry      Component Value Date/Time   NA 138 12/17/2023 0909   NA 139 10/02/2023 1035   K 4.2 12/17/2023 0909   CL 107 12/17/2023 0909   CO2 27 12/17/2023 0909   BUN 11 12/17/2023 0909   BUN 12 10/02/2023 1035   CREATININE 0.67 12/17/2023 0909   CREATININE 0.59 06/16/2023 0851      Component Value Date/Time   CALCIUM 8.3 (L) 12/17/2023 0909   ALKPHOS 46 12/17/2023 0909   AST 20 12/17/2023 0909   ALT 27 12/17/2023 0909   BILITOT 0.4 12/17/2023 0909       RADIOGRAPHIC STUDIES: I have personally reviewed the radiological images as listed and agreed with the findings in the report. No results found.  Total time: 10 min  I connected with  Kathleen Savage on 12/31/23 by a telephone application and verified that I am speaking with the correct person using two identifiers.   I discussed the limitations of evaluation  and management by telemedicine. The patient expressed understanding and agreed to proceed.  Location of provider: Office Location of patient: Home.  All questions were answered. The patient knows to call the clinic with any problems, questions or concerns.   Rachel Moulds, MD 12/31/2023 9:01 AM

## 2023-12-31 NOTE — Assessment & Plan Note (Signed)
 This is a very pleasant 48 year old female patient with past medical history significant for HIV on antiretroviral therapy, DVT on anticoagulation who is here for telephone follow-up.  Deep Vein Thrombosis (DVT) Completed six months of anticoagulation therapy for a previous DVT.  She is here to review her hypercoagulable workup.  So far all hypercoagulable workup was negative Factor V Leiden and prothrombin gene mutation is still pending. If this hypercoagulable workup also comes back normal, she can discontinue anticoagulation.  She clearly understands that if she were to have another blood clot, she should consider lifetime anticoagulation.  She is well aware of her symptoms and signs of DVT and the risk factors for this. She can return to hematology as needed for follow up.

## 2024-01-01 LAB — PROTHROMBIN GENE MUTATION

## 2024-01-04 LAB — FACTOR 5 LEIDEN

## 2024-01-05 ENCOUNTER — Ambulatory Visit: Payer: Self-pay | Admitting: Infectious Diseases

## 2024-01-07 ENCOUNTER — Ambulatory Visit: Payer: Self-pay | Admitting: Infectious Diseases

## 2024-01-08 ENCOUNTER — Ambulatory Visit: Payer: Self-pay | Attending: Nurse Practitioner | Admitting: Nurse Practitioner

## 2024-01-08 ENCOUNTER — Encounter: Payer: Self-pay | Admitting: Nurse Practitioner

## 2024-01-08 VITALS — Resp 20 | Ht 61.0 in

## 2024-01-08 DIAGNOSIS — Z1231 Encounter for screening mammogram for malignant neoplasm of breast: Secondary | ICD-10-CM

## 2024-01-08 DIAGNOSIS — Z124 Encounter for screening for malignant neoplasm of cervix: Secondary | ICD-10-CM

## 2024-01-08 NOTE — Progress Notes (Signed)
 Here for pap however on menstrual cycle. Would like to cancel appt today

## 2024-01-12 ENCOUNTER — Other Ambulatory Visit: Payer: Self-pay

## 2024-01-12 ENCOUNTER — Encounter: Payer: Self-pay | Admitting: Infectious Diseases

## 2024-01-12 ENCOUNTER — Ambulatory Visit (INDEPENDENT_AMBULATORY_CARE_PROVIDER_SITE_OTHER): Payer: Self-pay | Admitting: Infectious Diseases

## 2024-01-12 ENCOUNTER — Other Ambulatory Visit (HOSPITAL_COMMUNITY): Payer: Self-pay

## 2024-01-12 VITALS — BP 115/75 | HR 77 | Resp 16 | Ht 61.0 in | Wt 259.0 lb

## 2024-01-12 DIAGNOSIS — Z0000 Encounter for general adult medical examination without abnormal findings: Secondary | ICD-10-CM

## 2024-01-12 DIAGNOSIS — Z113 Encounter for screening for infections with a predominantly sexual mode of transmission: Secondary | ICD-10-CM

## 2024-01-12 DIAGNOSIS — B20 Human immunodeficiency virus [HIV] disease: Secondary | ICD-10-CM

## 2024-01-12 DIAGNOSIS — Z23 Encounter for immunization: Secondary | ICD-10-CM

## 2024-01-12 DIAGNOSIS — Z5181 Encounter for therapeutic drug level monitoring: Secondary | ICD-10-CM

## 2024-01-12 MED ORDER — BIKTARVY 50-200-25 MG PO TABS: 1.0000 | ORAL_TABLET | Freq: Every day | ORAL | 11 refills | Status: DC

## 2024-01-12 NOTE — Progress Notes (Addendum)
 [address], [place], [place], [address]                                                                  Phn. [phone]; Fax: [phone]                                                                             Date: 01/12/24  Reason for Visit: HIV fu    HPI: Kathleen Savage is a 48 y.o.old female with a history of HIV well controlled on Biktarvy, prediabetes and HLD, previously seen by Dr Orvan Falconer for a long time who is here for fu.   Interval hx/current visit: Reports not taking Biktarvy for a month due to issues with [employer] white application otherwise no concerns. She is following with her PCP. Last seen on 09/2023 where interventions for ca screening were discussed and plan for appropriate tests ordered/scheduled. Seen by Vascular 06/2023, PMR on 9/17, sports medicine on 2/18, notes reviewed. Seen by Hematology on 2/20 after she had completed 6 months of AC and her hypercoagulability work up was negative. No new concerns since last visit. However reports difficulty working in [place] due to pain multiple pain areas and thinking to cut back hours. She wants to get Flu and PCV 20 vaccine.   ROS: As stated in above HPI; all other systems were reviewed and are otherwise negative unless noted below  No reported fever / chills, night sweats, unintentional weight loss, acute visual change, odynophagia, chest pain/pressure, new or worsened SOB or WOB, nausea, vomiting, diarrhea, dysuria, GU discharge, syncope, seizures, red/hot swollen joints, hallucinations / delusions, rashes, new allergies, unusual / excessive bleeding, swollen lymph nodes  PMH/ PSH/ FamHx / Social Hx , medications and allergies reviewed and updated as appropriate; please see corresponding tab in EHR / prior notes  Current  Outpatient Medications on File Prior to Visit  Medication Sig Dispense Refill   acetaminophen-codeine (TYLENOL #3) 300-30 MG tablet Take 1-2 tablets by mouth every 8 (eight) hours as needed for moderate pain (pain score 4-6). 60 tablet 0   DICLOFENAC PO Take 100 mg by mouth daily.     gabapentin (NEURONTIN) 300 MG capsule Take 1 capsule (300 mg total) by mouth 3 (three) times daily. 90 capsule 3   topiramate (TOPAMAX) 25 MG tablet Take 1 tablet (25 mg total) by mouth at bedtime. 90 tablet 3   No current facility-administered medications on file prior to visit.   No Known Allergies  Past Medical History:  Diagnosis Date   Glucose intolerance (pre-diabetes)    HIV infection (HCC) 2005   Hyperlipidemia    Past Surgical  History:  Procedure Laterality Date   LOWER EXTREMITY VENOGRAPHY Left 05/12/2023   Procedure: LOWER EXTREMITY VENOGRAPHY;  Surgeon: Nada Libman, MD;  [facility];  Service: Cardiovascular;  Laterality: Left;   PERIPHERAL VASCULAR BALLOON ANGIOPLASTY  05/12/2023   Procedure: PERIPHERAL VASCULAR BALLOON ANGIOPLASTY;  Surgeon: Nada Libman, MD;  Location: [facility];  Service: Cardiovascular;;   PERIPHERAL VASCULAR THROMBECTOMY N/A 05/12/2023   Procedure: MECHANICAL THROMBECTOMY;  Surgeon: Nada Libman, MD;  Location: [facility];  Service: Cardiovascular;  Laterality: N/A;    Social History   Socioeconomic History   Marital status: Married    Spouse name: Not on file   Number of children: 2   Years of education: Not on file   Highest education level: 6th grade  Occupational History   Not on file  Tobacco Use   Smoking status: Never   Smokeless tobacco: Never  Vaping Use   Vaping status: Never Used  Substance and Sexual Activity   Alcohol use: Yes    Alcohol/week: 3.0 standard drinks of alcohol    Types: 1 Glasses of wine, 1 Cans of beer, 1 Shots of liquor per week    Comment: occasional   Drug use: No   Sexual activity:  Yes    Partners: Male    Birth control/protection: Condom    Comment: given condoms  Other Topics Concern   Not on file  Social History Narrative   Not on file   Social Drivers of Health   Financial Resource Strain: Low Risk  (01/06/2024)   Overall Financial Resource Strain (CARDIA)    Difficulty of Paying Living Expenses: Not very hard  Food Insecurity: No Food Insecurity (01/06/2024)   Hunger Vital Sign    Worried About Running Out of Food in the Last Year: Never true    Ran Out of Food in the Last Year: Never true  Transportation Needs: No Transportation Needs (01/06/2024)   PRAPARE - Administrator, Civil Service (Medical): No    Lack of Transportation (Non-Medical): No  Physical Activity: Insufficiently Active (01/06/2024)   Exercise Vital Sign    Days of Exercise per Week: 3 days    Minutes of Exercise per Session: 30 min  Stress: Stress Concern Present (01/06/2024)   [employer] of Occupational Health - Occupational Stress Questionnaire    Feeling of Stress : Rather much  Social Connections: Moderately Isolated (01/06/2024)   Social Connection and Isolation Panel [NHANES]    Frequency of Communication with Friends and Family: Three times a week    Frequency of Social Gatherings with Friends and Family: Once a week    Attends Religious Services: Never    Database administrator or Organizations: No    Attends Banker Meetings: Never    Marital Status: Married  Catering manager Violence: Not At Risk (10/02/2023)   Humiliation, Afraid, Rape, and Kick questionnaire    Fear of Current or Ex-Partner: No    Emotionally Abused: No    Physically Abused: No    Sexually Abused: No   Family History  Problem Relation Age of Onset   Cancer Maternal Aunt        Breast Cancer   Breast cancer Maternal Aunt    Cancer Cousin        Breast cancer - maternal cousin   Breast cancer Cousin    Cancer Cousin        breast cancer  Breast cancer Cousin     Cancer Cousin        breast cancer   Cancer Maternal Aunt        stomach cancer   Breast cancer Maternal Aunt    Hypertension Mother    Stroke Father    Cancer Sister    Uterine cancer Sister     Vitals  BP 115/75   Pulse 77   Resp 16   Ht 5\' 1"  (1.549 m)   Wt 259 lb (117.5 kg)   LMP 01/11/2024 (Approximate)   SpO2 100%   BMI 48.94 kg/m    Examination  Gen: no acute distress, morbidly obese  HEENT: [place]/AT, no scleral icterus, no pale conjunctivae, hearing normal, oral mucosa moist Neck: Supple Cardio: Regular rate and rhythm Resp: Pulmonary effort normal in room air GI: nondistended GU: Musc: Extremities: No pedal edema Skin: No rashes Neuro: grossly non focal , awake, alert and oriented * 3  Psych: Calm, cooperative  Lab Results HIV 1 RNA Quant  Date Value  06/16/2023 Not Detected Copies/mL  06/11/2022 <20 Copies/mL (H)  05/22/2020 <20 DETECTED copies/mL (A)   HIV-1 RNA Viral Load (copies/mL)  Date Value  12/23/2021 <20   CD4 T Cell Abs (/uL)  Date Value  06/16/2023 877  06/11/2022 1,077  05/22/2020 966   No results found for: "HIV1GENOSEQ" Lab Results  Component Value Date   WBC 5.4 12/17/2023   HGB 12.0 12/17/2023   HCT 36.3 12/17/2023   MCV 88.3 12/17/2023   PLT 334 12/17/2023    Lab Results  Component Value Date   CREATININE 0.67 12/17/2023   BUN 11 12/17/2023   NA 138 12/17/2023   K 4.2 12/17/2023   CL 107 12/17/2023   CO2 27 12/17/2023   Lab Results  Component Value Date   ALT 27 12/17/2023   AST 20 12/17/2023   ALKPHOS 46 12/17/2023   BILITOT 0.4 12/17/2023    Lab Results  Component Value Date   CHOL 221 (H) 12/29/2022   TRIG 93 12/29/2022   HDL 56 12/29/2022   LDLCALC 149 (H) 12/29/2022   No results found for: "HAV" Lab Results  Component Value Date   HEPBSAG NEGATIVE 05/30/2014   HEPBSAB NEG 05/30/2014   Lab Results  Component Value Date   HCVAB NEGATIVE 05/30/2014   Lab Results  Component Value Date    CHLAMYDIAWP Negative 06/16/2023   N Negative 06/16/2023   No results found for: "GCPROBEAPT" No results found for: "QUANTGOLD"    Health Maintenance: Immunization History  Administered Date(s) Administered   H1N1 11/28/2008   Hepatitis B 12/04/2003, 07/02/2004, 11/27/2004   Hepatitis B, ADULT 06/13/2014, 01/23/2015   Influenza Split 08/04/2011, 07/27/2012   Influenza Whole 09/08/2006, 09/14/2007, 08/03/2008, 10/23/2009, 09/10/2010   Influenza,inj,Quad PF,6+ Mos 11/29/2013, 01/23/2015, 08/09/2015, 07/22/2016, 07/28/2017, 09/14/2018, 12/23/2021, 12/29/2022   Meningococcal Mcv4o 01/27/2017, 09/14/2018   Pneumococcal Conjugate-13 09/14/2018   Pneumococcal Polysaccharide-23 10/10/2003, 11/28/2008   Tdap 02/12/2022    Assessment/Plan: # HIV - Samples provided and staff will work on making sure RW application is filled so she can continue getting Biktarvy. Biktarvy refilled - Labs today  - Fu in 5-6 months   # STD screening  - No acute concerns  - Urine GC and RPR today  # Hyperlipidemia/Morbid Obesity/Prediabetes   - Follows PCP - Lipid panel today to see if she needs to be on statins   # DVT of LLE  - s/p completion of [facility]  # Immunization  COVID  Influenza 12/29/22, one dose today Monkeypox Pneumococcal PCV 13 09/2018, Pneumococcal PPV 09/2003 and  PCV 20 today Meningococcal Menveo 01/2017 and 09/2018 HepA check HAV ab next visit  Hep B  06/13/2014 and 01/2015,check Hep B surface ab next visit  Tdap 02/12/2022 Zoster  #Health maintenance Hep C ab negative 2015 Hep B surface ag negative 05/2014 Papsmear /breast ca and colon ca screening - has been discussed, per PCP Dental Care has been discussed   Patient's labs were reviewed as well as his previous records. Patients questions were addressed and answered. Safe sex counseling done.  I have personally spent 35  minutes involved in face-to-face and non-face-to-face activities for this patient on the day of the visit.  Professional time spent includes the following activities: Preparing to see the patient (review of tests), Obtaining and/or reviewing separately obtained history (admission/discharge record), Performing a medically appropriate examination and/or evaluation , Ordering medications/tests/procedures, referring and communicating with other health care professionals, Documenting clinical information in the EMR, Independently interpreting results (not separately reported), Communicating results to the patient/family/caregiver, Counseling and educating the patient/family/caregiver and Care coordination (not separately reported).    Electronically signed by:  Odette Fraction, MD Infectious Disease Physician [facility] [address] [place], [place] [address] Phone: [phone]  Fax: [phone]

## 2024-01-13 LAB — T-HELPER CELLS (CD4) COUNT (NOT AT ARMC)
CD4 % Helper T Cell: 43 % (ref 33–65)
CD4 T Cell Abs: 322 /uL — ABNORMAL LOW (ref 400–1790)

## 2024-01-18 ENCOUNTER — Encounter: Payer: Self-pay | Admitting: Sports Medicine

## 2024-01-19 ENCOUNTER — Ambulatory Visit: Payer: Self-pay

## 2024-01-22 ENCOUNTER — Telehealth: Payer: Self-pay

## 2024-01-22 LAB — HIV-1 INTEGRASE GENOTYPE

## 2024-01-22 LAB — LIPID PANEL
Cholesterol: 164 mg/dL (ref <200)
HDL: 42 mg/dL — ABNORMAL LOW (ref >=50)
LDL Cholesterol (Calc): 99 mg/dL
Non-HDL Cholesterol (Calc): 122 mg/dL (ref <130)
Total CHOL/HDL Ratio: 3.9 (calc) (ref <5.0)
Triglycerides: 133 mg/dL (ref <150)

## 2024-01-22 LAB — HIV RNA, RTPCR W/R GT (RTI, PI,INT)
HIV 1 RNA Quant: 1100000 {copies}/mL — ABNORMAL HIGH
HIV-1 RNA Quant, Log: 6.04 {Log_copies}/mL — ABNORMAL HIGH

## 2024-01-22 LAB — HIV-1 GENOTYPE: HIV-1 Genotype: DETECTED — AB

## 2024-01-22 LAB — RPR: RPR Ser Ql: NONREACTIVE

## 2024-01-22 NOTE — Telephone Encounter (Signed)
-----   Message from Victoriano Lain sent at 01/22/2024  6:33 AM EDT ----- Please let her know her VL is elevated to 1 million, she was not taking her medications prior to the lab work. Recommend to continue her current ART without missing doses. Please schedule her appt date sooner, in 6-8 weeks time.  Her ASCVD score is 0.8-1%, recommend healthy diet with more fruits, vegetable and regular exercise for now.

## 2024-01-22 NOTE — Telephone Encounter (Signed)
 Called patient to discuss results, no answer. Interpreter left message requesting call back.   [employer] [phone]  Sandie Ano, RN

## 2024-01-25 NOTE — Progress Notes (Unsigned)
    Aleen Sells D.[facility] [address] [place] [address] Phone: [phone]   Assessment and Plan:     There are no diagnoses linked to this encounter.  ***   Pertinent previous records reviewed include ***    Follow Up: ***     Subjective:   I, Kathleen Savage, am serving as a Neurosurgeon for Doctor Richardean Sale   Chief Complaint: bilat leg  and back pain    HPI:    12/29/2023 Patient is a 48 year old female with bilat leg and bakl  pain. Patient states pain for about 2 years. No MOI. Pain starts at the neck and goes down her low back to her hip , groin and then the knee. Tylenol with codein for the pain . Didn't help with the pain made her sleepy. No numbness and tingling. Just pain. She has a hard time standing up straight, antalgic gait. Constant pain. 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 She unloads trucks. She is not able to put her socks on    01/26/2024 Patient states  Relevant Historical Information: History of DVT in left leg with recent completion of 4-month course of anticoagulation, HIV,  Additional pertinent review of systems negative.   Current Outpatient Medications:    acetaminophen-codeine (TYLENOL #3) 300-30 MG tablet, Take 1-2 tablets by mouth every 8 (eight) hours as needed for moderate pain (pain score 4-6)., Disp: 60 tablet, Rfl: 0   bictegravir-emtricitabine-tenofovir AF (BIKTARVY) 50-200-25 MG TABS tablet, Take 1 tablet by mouth daily., Disp: 30 tablet, Rfl: 11   DICLOFENAC PO, Take 100 mg by mouth daily., Disp: , Rfl:    gabapentin (NEURONTIN) 300 MG capsule, Take 1 capsule (300 mg total) by mouth 3 (three) times daily., Disp: 90 capsule, Rfl: 3   topiramate (TOPAMAX) 25 MG tablet, Take 1 tablet (25 mg total) by mouth at bedtime., Disp: 90 tablet, Rfl: 3   Objective:     There were no vitals filed for this visit.    There is no height or weight on file to calculate BMI.    Physical Exam:     ***   Electronically signed by:  Aleen Sells D.[facility] 7:36 AM 01/25/24

## 2024-01-25 NOTE — Telephone Encounter (Signed)
 LVM for patient to call back. ?

## 2024-01-26 ENCOUNTER — Other Ambulatory Visit: Payer: Self-pay

## 2024-01-26 ENCOUNTER — Encounter (INDEPENDENT_AMBULATORY_CARE_PROVIDER_SITE_OTHER): Payer: Self-pay

## 2024-01-26 ENCOUNTER — Ambulatory Visit (INDEPENDENT_AMBULATORY_CARE_PROVIDER_SITE_OTHER): Payer: Self-pay | Admitting: Sports Medicine

## 2024-01-26 VITALS — HR 84 | Ht 61.0 in | Wt 259.0 lb

## 2024-01-26 DIAGNOSIS — M25561 Pain in right knee: Secondary | ICD-10-CM

## 2024-01-26 DIAGNOSIS — M5441 Lumbago with sciatica, right side: Secondary | ICD-10-CM

## 2024-01-26 DIAGNOSIS — M25552 Pain in left hip: Secondary | ICD-10-CM

## 2024-01-26 DIAGNOSIS — M25551 Pain in right hip: Secondary | ICD-10-CM

## 2024-01-26 DIAGNOSIS — G8929 Other chronic pain: Secondary | ICD-10-CM

## 2024-01-26 DIAGNOSIS — M25562 Pain in left knee: Secondary | ICD-10-CM

## 2024-01-26 DIAGNOSIS — M5442 Lumbago with sciatica, left side: Secondary | ICD-10-CM

## 2024-01-26 DIAGNOSIS — E669 Obesity, unspecified: Secondary | ICD-10-CM

## 2024-01-26 MED ORDER — METHYLPREDNISOLONE 4 MG PO TBPK
ORAL_TABLET | ORAL | 0 refills | Status: DC
  Filled 2024-01-26: qty 21, 6d supply, fill #0

## 2024-01-26 NOTE — Patient Instructions (Signed)
 Discontinue meloxicam  Prednisone dos pak  Weight loss referral  Continue PT  4 week follow up

## 2024-01-27 ENCOUNTER — Other Ambulatory Visit: Payer: Self-pay

## 2024-01-28 ENCOUNTER — Ambulatory Visit
Admission: RE | Admit: 2024-01-28 | Discharge: 2024-01-28 | Disposition: A | Discharge status: Home / Self Care | Payer: Self-pay | Source: Ambulatory Visit | Attending: Nurse Practitioner

## 2024-01-28 DIAGNOSIS — Z1231 Encounter for screening mammogram for malignant neoplasm of breast: Secondary | ICD-10-CM

## 2024-02-02 ENCOUNTER — Other Ambulatory Visit (HOSPITAL_COMMUNITY)
Admission: RE | Admit: 2024-02-02 | Discharge: 2024-02-02 | Disposition: A | Discharge status: Home / Self Care | Source: Ambulatory Visit | Attending: Nurse Practitioner | Admitting: Nurse Practitioner

## 2024-02-02 ENCOUNTER — Other Ambulatory Visit: Payer: Self-pay

## 2024-02-02 ENCOUNTER — Encounter: Payer: Self-pay | Admitting: Nurse Practitioner

## 2024-02-02 ENCOUNTER — Ambulatory Visit: Payer: Self-pay | Attending: Nurse Practitioner | Admitting: Nurse Practitioner

## 2024-02-02 ENCOUNTER — Ambulatory Visit: Payer: Self-pay | Attending: Sports Medicine

## 2024-02-02 VITALS — BP 121/82 | HR 70 | Resp 19 | Ht 61.0 in | Wt 258.2 lb

## 2024-02-02 DIAGNOSIS — M25551 Pain in right hip: Secondary | ICD-10-CM | POA: Insufficient documentation

## 2024-02-02 DIAGNOSIS — Z124 Encounter for screening for malignant neoplasm of cervix: Secondary | ICD-10-CM

## 2024-02-02 DIAGNOSIS — M542 Cervicalgia: Secondary | ICD-10-CM | POA: Insufficient documentation

## 2024-02-02 DIAGNOSIS — M5441 Lumbago with sciatica, right side: Secondary | ICD-10-CM | POA: Insufficient documentation

## 2024-02-02 DIAGNOSIS — M5442 Lumbago with sciatica, left side: Secondary | ICD-10-CM | POA: Insufficient documentation

## 2024-02-02 DIAGNOSIS — G8929 Other chronic pain: Secondary | ICD-10-CM | POA: Insufficient documentation

## 2024-02-02 DIAGNOSIS — M79604 Pain in right leg: Secondary | ICD-10-CM | POA: Insufficient documentation

## 2024-02-02 DIAGNOSIS — M25552 Pain in left hip: Secondary | ICD-10-CM | POA: Insufficient documentation

## 2024-02-02 NOTE — Therapy (Signed)
 OUTPATIENT PHYSICAL THERAPY EVALUATION   Patient Name: Kathleen Savage MRN: [id number] DOB:February 03, 1976, 48 y.o., female Today's Date: 02/02/2024  END OF SESSION:  PT End of Session - 02/02/24 0914     Visit Number 1    Number of Visits 16    Date for PT Re-Evaluation 03/29/24    PT Start Time 0915    PT Stop Time 0953    PT Time Calculation (min) 38 min    Behavior During Therapy [facility] for tasks assessed/performed             Past Medical History:  Diagnosis Date   Glucose intolerance (pre-diabetes)    HIV infection (HCC) 2005   Hyperlipidemia    Past Surgical History:  Procedure Laterality Date   LOWER EXTREMITY VENOGRAPHY Left 05/12/2023   Procedure: LOWER EXTREMITY VENOGRAPHY;  Surgeon: Nada Libman, MD;  [facility];  Service: Cardiovascular;  Laterality: Left;   PERIPHERAL VASCULAR BALLOON ANGIOPLASTY  05/12/2023   Procedure: PERIPHERAL VASCULAR BALLOON ANGIOPLASTY;  Surgeon: Nada Libman, MD;  [facility]: [facility];  Service: Cardiovascular;;   PERIPHERAL VASCULAR THROMBECTOMY N/A 05/12/2023   Procedure: MECHANICAL THROMBECTOMY;  Surgeon: Nada Libman, MD;  [facility]: [facility];  Service: Cardiovascular;  Laterality: N/A;   Patient Active Problem List   Diagnosis Date Noted   Encounter for immunization 01/12/2024   Screening examination for venereal disease 07/01/2023   Medication monitoring encounter 07/01/2023   Health care maintenance 06/30/2023   Acute deep vein thrombosis (DVT) of iliac vein of left lower extremity (HCC) 05/08/2023   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   Patellofemoral pain syndrome 08/01/2020   Bell's palsy 02/09/2018   Elevated liver enzymes 11/29/2013   Dyslipidemia 01/25/2013   Hyperglycemia 01/25/2013   Multiple lipomas 01/25/2013   Morbid obesity (HCC) 05/07/2010   LOW BACK PAIN, CHRONIC 11/28/2008   Alopecia 08/03/2008   Herpes zoster 12/01/2006   PAP SMEAR, ABNORMAL 12/01/2006   Human  immunodeficiency virus (HIV) disease (HCC) 09/14/2006    PCP: Claiborne Rigg, NP  REFERRING PROVIDER: Richardean Sale, DO  REFERRING DIAG: Chronic bilateral low back pain with bilateral sciatica [M54.42, M54.41, G89.29], Neck pain [M54.2], Right leg pain [M79.604], Bilateral hip pain [M25.551, M25.552]   THERAPY DIAG:  Chronic bilateral low back pain with bilateral sciatica  Neck pain  Right leg pain  Bilateral hip pain  Rationale for Evaluation and Treatment: Rehabilitation  ONSET DATE: 2+ years  SUBJECTIVE:   SUBJECTIVE STATEMENT: Patient reports to PT with primary concern of right-sided low back and right leg pain with unknown onset. She feels that she has the most difficulty with bending her knees and lower body dressing. She sometimes wakes up at night d/t pain especially with laying on her side or back.   PERTINENT HISTORY: Relevant PMHx includes HIV, DVT of L LE, Obesity, Patellofemoral pain syndrome   PAIN:  Are you having pain?  Yes: NPRS scale: 5/10 current, 9/10 worst Pain location: right-sided low back, BIL hip/groin, right knee Pain description: tight, burning, aching Aggravating factors: bending, overhead reaching, working   Relieving factors: medicine as needed  PRECAUTIONS: None  RED FLAGS: None   WEIGHT BEARING RESTRICTIONS: No  FALLS:  Has patient fallen in last 6 months? No  LIVING ENVIRONMENT: Lives with: lives with their family and lives with their spouse Lives in: House/apartment Stairs: No  OCCUPATION: works at [employer]   PLOF: Independent  PATIENT GOALS: To have less pain with daily  activity  NEXT MD VISIT: 02/23/24 with referring provider   OBJECTIVE:  Note: Objective measures were completed at Evaluation unless otherwise noted.  DIAGNOSTIC FINDINGS:   04/04/2023 Lumbar Spine X-Ray   FINDINGS: No fracture, dislocation or subluxation. No spondylolisthesis. No osteolytic or osteoblastic changes.   Degenerative disc  disease noted with disc space narrowing and marginal osteophytes at each lumbar level.   IMPRESSION: Multilevel degenerative changes. No acute osseous abnormalities.   PATIENT SURVEYS:  LEFS 18/80 = 32% perceived functional ability   COGNITION: Overall cognitive status: Within functional limits for tasks assessed      MUSCLE LENGTH: Hamstrings (SLR): Right 35 deg; Left 50 deg  POSTURE: rounded shoulders and forward head  PALPATION: Increased muscle turgor throughout BIL thoracolumbar paraspinal mm; moderate pain and tenderness throughout L1-L5 CPA, with decreased spinal mobility from L3-S1.   LUMBAR ROM:   Active  A/PROM  eval  Flexion 60%  Extension 50%  Right lateral flexion 60%  Left lateral flexion 50%  Right rotation WFL  Left rotation WFL   (Blank rows = not tested)   % of full AROM    LOWER EXTREMITY ROM:  Active ROM Right eval Left eval  Hip flexion    Hip extension    Hip abduction    Hip adduction    Hip internal rotation    Hip external rotation    Knee flexion    Knee extension    Ankle dorsiflexion    Ankle plantarflexion    Ankle inversion    Ankle eversion     (Blank rows = not tested)  LOWER EXTREMITY MMT:  MMT Right eval Left eval  Hip flexion 4+ 4+  Hip extension    Hip abduction 4+ 4+  Hip adduction 4 4  Hip internal rotation 4- 4  Hip external rotation 4 4+  Knee flexion 5 5  Knee extension 5 5  Ankle dorsiflexion 5 5  Ankle plantarflexion    Ankle inversion    Ankle eversion     (Blank rows = not tested)                                                                                                                                 TREATMENT DATE:   [facility] Adult PT Treatment:                                                DATE: 02/02/2024   Initial evaluation: see patient education and home exercise program as noted below      PATIENT EDUCATION:  Education details: reviewed initial home exercise program;  discussion of POC, prognosis and goals for skilled PT   Person educated: Patient Education method: Explanation, Demonstration, and Handouts Education comprehension: verbalized understanding, returned demonstration, and needs further education  HOME  EXERCISE PROGRAM: Patient has HEP from referring provider. I encouraged her to increase frequency to 4-5x/week.  New/Updated HEP to be provided at a follow up visit.     ASSESSMENT:  CLINICAL IMPRESSION: Yen is a 48 y.o. female who was seen today for physical therapy evaluation and treatment for persistent low back pain and R LE pain.. She is demonstrating decreased LQ MMT scores, decreased R>L hamstring muscle length, decreased LS AROM and decreased LS mobility with CPA assessment. She has related pain and difficulty with squatting, transfers, lower body dressing, and prolonged weight bearing activities as needed for household and occupational duties. She additionally has impaired sleep quality.   OBJECTIVE IMPAIRMENTS: decreased activity tolerance, decreased ROM, decreased strength, postural dysfunction, and pain.   ACTIVITY LIMITATIONS: lifting, bending, squatting, sleeping, stairs, and dressing  PARTICIPATION LIMITATIONS: cleaning, shopping, community activity, and occupation  PERSONAL FACTORS: Past/current experiences, Time since onset of injury/illness/exacerbation, and 1-2 comorbidities: Relevant PMHx includes HIV, DVT of L LE, Obesity, Patellofemoral pain syndrome   are also affecting patient's functional outcome.   REHAB POTENTIAL: Fair    CLINICAL DECISION MAKING: Evolving/moderate complexity  EVALUATION COMPLEXITY: Moderate   GOALS: Goals reviewed with patient? Yes  SHORT TERM GOALS: Target date: 03/01/2024   Patient will be independent with initial home program at least 4x/week.  Goal status: INITIAL  2.  Patient will demonstrate at least 4+/5 MMT with LE strength testing Baseline: see objective measures  Goal status:  INITIAL     LONG TERM GOALS: Target date: 03/29/2024    Patient will report improved overall functional ability with LEFS score of 50/80 or greater.  Baseline: 18/80 Goal status: INITIAL  2.  Patient will demonstrate improved R hamstring flexibility by at least 15 degrees.  Baseline: see objective measures  Goal status: INITIAL  3.  Patient will report 5/10 pain at worst with with daily activities and work-related activities Baseline: 9/10 Goal status: INITIAL  4.  Patient will demonstrate ability to perform floor to waist lifting of at least 20# using appropriate body mechanics and with no more than minimal pain in order to safely perform normal daily/occupational tasks.  Goal status: INITIAL     PLAN:  PT FREQUENCY: 1-2x/week  PT DURATION: 8 weeks  PLANNED INTERVENTIONS: [id number]- PT Re-evaluation, 97110-Therapeutic exercises, 97530- Therapeutic activity, 97112- Neuromuscular re-education, 97535- Self Care, 40981- Manual therapy, [phone]- Aquatic Therapy, [phone]- Electrical stimulation (unattended), [phone]- Electrical stimulation (manual), [phone]- Traction (mechanical), Patient/Family education, Taping, Dry Needling, Joint mobilization, Joint manipulation, Spinal manipulation, Spinal mobilization, Cryotherapy, and Moist heat  PLAN FOR NEXT SESSION:  assess neck pain and associated objective measures; review exercises from referring provider and provide updated HEP; Lower quarter mobility and pain modulation via manual therapy, aerobic activities, AAROM/AROM, and modalities   Mauri Reading, PT, DPT  02/07/2024 10:33 AM

## 2024-02-02 NOTE — Progress Notes (Signed)
 Assessment & Plan:  [facility]" was seen today for gynecologic exam.  Diagnoses and all orders for this visit:  Encounter for Papanicolaou smear for cervical cancer screening -     Cytology - PAP -     Cervicovaginal ancillary only    Patient has been counseled on age-appropriate routine health concerns for screening and prevention. These are reviewed and up-to-date. Referrals have been placed accordingly. Immunizations are up-to-date or declined.    Subjective:   Chief Complaint  Patient presents with   Gynecologic Exam    Kathleen Savage 48 y.o. female presents to office today well woman exam. She has been experiencing low back pain with right sided sciatica. Currently being followed by ortho and plans are now underway for   Review of Systems  Constitutional: Negative.  Negative for chills, fever, malaise/fatigue and weight loss.  Respiratory: Negative.  Negative for cough, shortness of breath and wheezing.   Cardiovascular: Negative.  Negative for chest pain, orthopnea and leg swelling.  Gastrointestinal:  Negative for abdominal pain.  Genitourinary: Negative.  Negative for flank pain.  Skin: Negative.  Negative for rash.  Psychiatric/Behavioral:  Negative for suicidal ideas.     Past Medical History:  Diagnosis Date   Glucose intolerance (pre-diabetes)    HIV infection (HCC) 2005   Hyperlipidemia     Past Surgical History:  Procedure Laterality Date   LOWER EXTREMITY VENOGRAPHY Left 05/12/2023   Procedure: LOWER EXTREMITY VENOGRAPHY;  Surgeon: Nada Libman, MD;  Location: [facility];  Service: Cardiovascular;  Laterality: Left;   PERIPHERAL VASCULAR BALLOON ANGIOPLASTY  05/12/2023   Procedure: PERIPHERAL VASCULAR BALLOON ANGIOPLASTY;  Surgeon: Nada Libman, MD;  Location: [facility];  Service: Cardiovascular;;   PERIPHERAL VASCULAR THROMBECTOMY N/A 05/12/2023   Procedure: MECHANICAL THROMBECTOMY;  Surgeon: Nada Libman, MD;  Location:  [facility];  Service: Cardiovascular;  Laterality: N/A;    Family History  Problem Relation Age of Onset   Cancer Maternal Aunt        Breast Cancer   Breast cancer Maternal Aunt    Cancer Cousin        Breast cancer - maternal cousin   Breast cancer Cousin    Cancer Cousin        breast cancer   Breast cancer Cousin    Cancer Cousin        breast cancer   Cancer Maternal Aunt        stomach cancer   Breast cancer Maternal Aunt    Hypertension Mother    Stroke Father    Cancer Sister    Uterine cancer Sister     Social History Reviewed with no changes to be made today.   Outpatient Medications Prior to Visit  Medication Sig Dispense Refill   acetaminophen-codeine (TYLENOL #3) 300-30 MG tablet Take 1-2 tablets by mouth every 8 (eight) hours as needed for moderate pain (pain score 4-6). 60 tablet 0   bictegravir-emtricitabine-tenofovir AF (BIKTARVY) 50-200-25 MG TABS tablet Take 1 tablet by mouth daily. 30 tablet 11   DICLOFENAC PO Take 100 mg by mouth daily.     gabapentin (NEURONTIN) 300 MG capsule Take 1 capsule (300 mg total) by mouth 3 (three) times daily. 90 capsule 3   methylPREDNISolone (MEDROL DOSEPAK) 4 MG TBPK tablet Take 6 tablets on day 1.  Take 5 tablets on day 2.  Take 4 tablets on day 3.  Take 3 tablets on day 4.  Take  2 tablets on day 5.  Take 1 tablet on day 6. 21 tablet 0   topiramate (TOPAMAX) 25 MG tablet Take 1 tablet (25 mg total) by mouth at bedtime. 90 tablet 3   No facility-administered medications prior to visit.    No Known Allergies     Objective:    BP 121/82 (BP Location: Left Arm, Patient Position: Sitting, Cuff Size: Normal)   Pulse 70   Resp 19   Wt 258 lb 3.2 oz (117.1 kg)   LMP 01/11/2024 (Approximate)   SpO2 100%   BMI 48.79 kg/m  Wt Readings from Last 3 Encounters:  02/02/24 258 lb 3.2 oz (117.1 kg)  01/26/24 259 lb (117.5 kg)  01/12/24 259 lb (117.5 kg)    Physical Exam Exam conducted with a chaperone present.   Constitutional:      Appearance: She is well-developed.  HENT:     Head: Normocephalic.  Cardiovascular:     Rate and Rhythm: Normal rate and regular rhythm.     Heart sounds: Normal heart sounds.  Pulmonary:     Effort: Pulmonary effort is normal.     Breath sounds: Normal breath sounds.  Abdominal:     General: Bowel sounds are normal.     Palpations: Abdomen is soft.     Hernia: There is no hernia in the left inguinal area.  Genitourinary:    Exam position: Lithotomy position.     Labia:        Right: No rash, tenderness, lesion or injury.        Left: No rash, tenderness, lesion or injury.      Vagina: Normal. No signs of injury and foreign body. No vaginal discharge, erythema, tenderness or bleeding.     Cervix: Normal.     Uterus: Not deviated and not enlarged.      Adnexa:        Right: No mass, tenderness or fullness.         Left: No mass, tenderness or fullness.       Rectum: Normal. No external hemorrhoid.  Lymphadenopathy:     Lower Body: No right inguinal adenopathy. No left inguinal adenopathy.  Skin:    General: Skin is warm and dry.  Neurological:     Mental Status: She is alert and oriented to person, place, and time.  Psychiatric:        Behavior: Behavior normal.        Thought Content: Thought content normal.        Judgment: Judgment normal.          Patient has been counseled extensively about nutrition and exercise as well as the importance of adherence with medications and regular follow-up. The patient was given clear instructions to go to ER or return to medical center if symptoms don't improve, worsen or new problems develop. The patient verbalized understanding.   Follow-up: No follow-ups on file.   Claiborne Rigg, FNP-BC [facility] [place], [place] [phone]   02/02/2024, 11:42 AM

## 2024-02-03 LAB — CERVICOVAGINAL ANCILLARY ONLY
Bacterial Vaginitis (gardnerella): NEGATIVE
Candida Glabrata: NEGATIVE
Candida Vaginitis: NEGATIVE
Chlamydia: NEGATIVE
Comment: NEGATIVE
Comment: NEGATIVE
Comment: NEGATIVE
Comment: NEGATIVE
Comment: NEGATIVE
Comment: NORMAL
Neisseria Gonorrhea: NEGATIVE
Trichomonas: NEGATIVE

## 2024-02-03 LAB — CYTOLOGY - PAP
Comment: NEGATIVE
Diagnosis: NEGATIVE
High risk HPV: NEGATIVE

## 2024-02-04 ENCOUNTER — Encounter: Payer: Self-pay | Admitting: Nurse Practitioner

## 2024-02-05 ENCOUNTER — Other Ambulatory Visit: Payer: Self-pay | Admitting: Nurse Practitioner

## 2024-02-05 ENCOUNTER — Other Ambulatory Visit: Payer: Self-pay

## 2024-02-05 ENCOUNTER — Encounter: Payer: Self-pay | Admitting: Nurse Practitioner

## 2024-02-05 DIAGNOSIS — G8929 Other chronic pain: Secondary | ICD-10-CM

## 2024-02-05 MED ORDER — GABAPENTIN 300 MG PO CAPS
300.0000 mg | ORAL_CAPSULE | Freq: Three times a day (TID) | ORAL | 3 refills | Status: DC
  Filled 2024-02-05: qty 90, 30d supply, fill #0

## 2024-02-08 ENCOUNTER — Other Ambulatory Visit: Payer: Self-pay

## 2024-02-09 ENCOUNTER — Ambulatory Visit: Payer: Self-pay | Attending: Sports Medicine

## 2024-02-09 ENCOUNTER — Other Ambulatory Visit: Payer: Self-pay

## 2024-02-09 DIAGNOSIS — M542 Cervicalgia: Secondary | ICD-10-CM | POA: Insufficient documentation

## 2024-02-09 DIAGNOSIS — M25552 Pain in left hip: Secondary | ICD-10-CM | POA: Insufficient documentation

## 2024-02-09 DIAGNOSIS — M79604 Pain in right leg: Secondary | ICD-10-CM | POA: Insufficient documentation

## 2024-02-09 DIAGNOSIS — M5441 Lumbago with sciatica, right side: Secondary | ICD-10-CM | POA: Insufficient documentation

## 2024-02-09 DIAGNOSIS — G8929 Other chronic pain: Secondary | ICD-10-CM | POA: Insufficient documentation

## 2024-02-09 DIAGNOSIS — M5442 Lumbago with sciatica, left side: Secondary | ICD-10-CM | POA: Insufficient documentation

## 2024-02-09 DIAGNOSIS — M25551 Pain in right hip: Secondary | ICD-10-CM | POA: Insufficient documentation

## 2024-02-09 NOTE — Therapy (Signed)
 OUTPATIENT PHYSICAL THERAPY TREATMENT NOTE   Patient Name: Kathleen Savage MRN: [id number] DOB:1976-03-19, 48 y.o., female Today's Date: 02/09/2024  END OF SESSION:  PT End of Session - 02/09/24 0914     Visit Number 2    Number of Visits 16    Date for PT Re-Evaluation 03/29/24    PT Start Time 0915    PT Stop Time 0953    PT Time Calculation (min) 38 min    Activity Tolerance Patient tolerated treatment well    Behavior During Therapy [facility] for tasks assessed/performed              Past Medical History:  Diagnosis Date   Glucose intolerance (pre-diabetes)    HIV infection (HCC) 2005   Hyperlipidemia    Past Surgical History:  Procedure Laterality Date   LOWER EXTREMITY VENOGRAPHY Left 05/12/2023   Procedure: LOWER EXTREMITY VENOGRAPHY;  Surgeon: Nada Libman, MD;  [facility];  Service: Cardiovascular;  Laterality: Left;   PERIPHERAL VASCULAR BALLOON ANGIOPLASTY  05/12/2023   Procedure: PERIPHERAL VASCULAR BALLOON ANGIOPLASTY;  Surgeon: Nada Libman, MD;  [facility]: [facility];  Service: Cardiovascular;;   PERIPHERAL VASCULAR THROMBECTOMY N/A 05/12/2023   Procedure: MECHANICAL THROMBECTOMY;  Surgeon: Nada Libman, MD;  Location: [facility];  Service: Cardiovascular;  Laterality: N/A;   Patient Active Problem List   Diagnosis Date Noted   Encounter for immunization 01/12/2024   Screening examination for venereal disease 07/01/2023   Medication monitoring encounter 07/01/2023   Health care maintenance 06/30/2023   Acute deep vein thrombosis (DVT) of iliac vein of left lower extremity (HCC) 05/08/2023   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   Patellofemoral pain syndrome 08/01/2020   Bell's palsy 02/09/2018   Elevated liver enzymes 11/29/2013   Dyslipidemia 01/25/2013   Hyperglycemia 01/25/2013   Multiple lipomas 01/25/2013   Morbid obesity (HCC) 05/07/2010   LOW BACK PAIN, CHRONIC 11/28/2008   Alopecia 08/03/2008   Herpes  zoster 12/01/2006   PAP SMEAR, ABNORMAL 12/01/2006   Human immunodeficiency virus (HIV) disease (HCC) 09/14/2006    PCP: Claiborne Rigg, NP  REFERRING PROVIDER: Richardean Sale, DO  REFERRING DIAG: Chronic bilateral low back pain with bilateral sciatica [M54.42, M54.41, G89.29], Neck pain [M54.2], Right leg pain [M79.604], Bilateral hip pain [M25.551, M25.552]   THERAPY DIAG:  Chronic bilateral low back pain with bilateral sciatica  Neck pain  Right leg pain  Bilateral hip pain  Rationale for Evaluation and Treatment: Rehabilitation  ONSET DATE: 2+ years  SUBJECTIVE:   SUBJECTIVE STATEMENT: Patient reports that most of her pain today is in BIL hips to her knees, her lower back and neck are ok today. Has been compliant with HEP.  EVAL: Patient reports to PT with primary concern of right-sided low back and right leg pain with unknown onset. She feels that she has the most difficulty with bending her knees and lower body dressing. She sometimes wakes up at night d/t pain especially with laying on her side or back.   PERTINENT HISTORY: Relevant PMHx includes HIV, DVT of L LE, Obesity, Patellofemoral pain syndrome   PAIN:  Are you having pain?  Yes: NPRS scale: 5/10 current, 9/10 worst Pain location: right-sided low back, BIL hip/groin, right knee Pain description: tight, burning, aching Aggravating factors: bending, overhead reaching, working   Relieving factors: medicine as needed  PRECAUTIONS: None  RED FLAGS: None   WEIGHT BEARING RESTRICTIONS: No  FALLS:  Has patient fallen in  last 6 months? No  LIVING ENVIRONMENT: Lives with: lives with their family and lives with their spouse Lives in: House/apartment Stairs: No  OCCUPATION: works at [employer]   PLOF: Independent  PATIENT GOALS: To have less pain with daily activity  NEXT MD VISIT: 02/23/24 with referring provider   OBJECTIVE:  Note: Objective measures were completed at Evaluation unless  otherwise noted.  DIAGNOSTIC FINDINGS:   04/04/2023 Lumbar Spine X-Ray   FINDINGS: No fracture, dislocation or subluxation. No spondylolisthesis. No osteolytic or osteoblastic changes.   Degenerative disc disease noted with disc space narrowing and marginal osteophytes at each lumbar level.   IMPRESSION: Multilevel degenerative changes. No acute osseous abnormalities.   PATIENT SURVEYS:  LEFS 18/80 = 32% perceived functional ability   COGNITION: Overall cognitive status: Within functional limits for tasks assessed     MUSCLE LENGTH: Hamstrings (SLR): Right 35 deg; Left 50 deg  POSTURE: rounded shoulders and forward head  PALPATION: Increased muscle turgor throughout BIL thoracolumbar paraspinal mm; moderate pain and tenderness throughout L1-L5 CPA, with decreased spinal mobility from L3-S1.   LUMBAR ROM:   Active  A/PROM  eval  Flexion 60%  Extension 50%  Right lateral flexion 60%  Left lateral flexion 50%  Right rotation WFL  Left rotation WFL   (Blank rows = not tested)   % of full AROM    LOWER EXTREMITY ROM:  Active ROM Right eval Left eval  Hip flexion    Hip extension    Hip abduction    Hip adduction    Hip internal rotation    Hip external rotation    Knee flexion    Knee extension    Ankle dorsiflexion    Ankle plantarflexion    Ankle inversion    Ankle eversion     (Blank rows = not tested)  LOWER EXTREMITY MMT:  MMT Right eval Left eval  Hip flexion 4+ 4+  Hip extension    Hip abduction 4+ 4+  Hip adduction 4 4  Hip internal rotation 4- 4  Hip external rotation 4 4+  Knee flexion 5 5  Knee extension 5 5  Ankle dorsiflexion 5 5  Ankle plantarflexion    Ankle inversion    Ankle eversion     (Blank rows = not tested)                                                                                                                                TREATMENT DATE:  [facility] Adult PT Treatment:                                                 DATE: 02/09/24 Therapeutic Exercise: Nustep level 5 x 5 mins while planning session with patient Seated hamstring stretch 2x30" BIL Seated marching with TA activation  2x30" Seated hip adduction with ball and YTB at ankles for hip IR 2x10 Bridges 2x10 Supine hip adduction ball squeeze 5" hold 2x10 Sidelying clamshell 2x10 BIL STS no UE 2x10   [facility] Adult PT Treatment:                                                DATE: 02/02/24   Initial evaluation: see patient education and home exercise program as noted below      PATIENT EDUCATION:  Education details: reviewed initial home exercise program; discussion of POC, prognosis and goals for skilled PT   Person educated: Patient Education method: Explanation, Demonstration, and Handouts Education comprehension: verbalized understanding, returned demonstration, and needs further education  HOME EXERCISE PROGRAM: Patient has HEP from referring provider. I encouraged her to increase frequency to 4-5x/week.  New/Updated HEP to be provided at a follow up visit.     ASSESSMENT:  CLINICAL IMPRESSION: Patient presents to first follow up PT session reporting that most of her pain in in BIL LE today, from hips to her knees. Session today focused on core and proximal hip strengthening and stretching. Patient was able to tolerate all prescribed exercises with no adverse effects. Reviewed the HEP she has from her MD, advised to continue the ones she has found she can perform and to remove the ones she cannot (child's pose and figure 4 piriformis stretch). Patient continues to benefit from skilled PT services and should be progressed as able to improve functional independence.   EVAL: Quierra is a 48 y.o. female who was seen today for physical therapy evaluation and treatment for persistent low back pain and R LE pain.. She is demonstrating decreased LQ MMT scores, decreased R>L hamstring muscle length, decreased LS AROM and decreased LS mobility with  CPA assessment. She has related pain and difficulty with squatting, transfers, lower body dressing, and prolonged weight bearing activities as needed for household and occupational duties. She additionally has impaired sleep quality.   OBJECTIVE IMPAIRMENTS: decreased activity tolerance, decreased ROM, decreased strength, postural dysfunction, and pain.   ACTIVITY LIMITATIONS: lifting, bending, squatting, sleeping, stairs, and dressing  PARTICIPATION LIMITATIONS: cleaning, shopping, community activity, and occupation  PERSONAL FACTORS: Past/current experiences, Time since onset of injury/illness/exacerbation, and 1-2 comorbidities: Relevant PMHx includes HIV, DVT of L LE, Obesity, Patellofemoral pain syndrome   are also affecting patient's functional outcome.   REHAB POTENTIAL: Fair    CLINICAL DECISION MAKING: Evolving/moderate complexity  EVALUATION COMPLEXITY: Moderate   GOALS: Goals reviewed with patient? Yes  SHORT TERM GOALS: Target date: 03/01/2024   Patient will be independent with initial home program at least 4x/week.  Goal status: INITIAL  2.  Patient will demonstrate at least 4+/5 MMT with LE strength testing Baseline: see objective measures  Goal status: INITIAL     LONG TERM GOALS: Target date: 03/29/2024    Patient will report improved overall functional ability with LEFS score of 50/80 or greater.  Baseline: 18/80 Goal status: INITIAL  2.  Patient will demonstrate improved R hamstring flexibility by at least 15 degrees.  Baseline: see objective measures  Goal status: INITIAL  3.  Patient will report 5/10 pain at worst with with daily activities and work-related activities Baseline: 9/10 Goal status: INITIAL  4.  Patient will demonstrate ability to perform floor to waist lifting of at least 20# using appropriate body  mechanics and with no more than minimal pain in order to safely perform normal daily/occupational tasks.  Goal status:  INITIAL   PLAN:  PT FREQUENCY: 1-2x/week  PT DURATION: 8 weeks  PLANNED INTERVENTIONS: 97164- PT Re-evaluation, 97110-Therapeutic exercises, 97530- Therapeutic activity, 97112- Neuromuscular re-education, 97535- Self Care, 27062- Manual therapy, [phone]- Aquatic Therapy, [phone]- Electrical stimulation (unattended), [phone]- Electrical stimulation (manual), [phone]- Traction (mechanical), Patient/Family education, Taping, Dry Needling, Joint mobilization, Joint manipulation, Spinal manipulation, Spinal mobilization, Cryotherapy, and Moist heat  PLAN FOR NEXT SESSION:  assess neck pain and associated objective measures; review exercises from referring provider and provide updated HEP; Lower quarter mobility and pain modulation via manual therapy, aerobic activities, AAROM/AROM, and modalities   Berta Minor PTA  02/09/2024 9:51 AM

## 2024-02-13 ENCOUNTER — Encounter

## 2024-02-15 NOTE — Therapy (Signed)
 OUTPATIENT PHYSICAL THERAPY TREATMENT NOTE   Patient Name: Kathleen Savage MRN: [id number] DOB:09-22-1976, 48 y.o., female Today's Date: 02/16/2024  END OF SESSION:  PT End of Session - 02/16/24 0914     Visit Number 3    Number of Visits 16    Date for PT Re-Evaluation 03/29/24    PT Start Time 0915    PT Stop Time 0954    PT Time Calculation (min) 39 min    Activity Tolerance Patient tolerated treatment well               Past Medical History:  Diagnosis Date   Glucose intolerance (pre-diabetes)    HIV infection (HCC) 2005   Hyperlipidemia    Past Surgical History:  Procedure Laterality Date   LOWER EXTREMITY VENOGRAPHY Left 05/12/2023   Procedure: LOWER EXTREMITY VENOGRAPHY;  Surgeon: Nada Libman, MD;  Location: [facility];  Service: Cardiovascular;  Laterality: Left;   PERIPHERAL VASCULAR BALLOON ANGIOPLASTY  05/12/2023   Procedure: PERIPHERAL VASCULAR BALLOON ANGIOPLASTY;  Surgeon: Nada Libman, MD;  Location: [facility];  Service: Cardiovascular;;   PERIPHERAL VASCULAR THROMBECTOMY N/A 05/12/2023   Procedure: MECHANICAL THROMBECTOMY;  Surgeon: Nada Libman, MD;  Location: [facility];  Service: Cardiovascular;  Laterality: N/A;   Patient Active Problem List   Diagnosis Date Noted   Encounter for immunization 01/12/2024   Screening examination for venereal disease 07/01/2023   Medication monitoring encounter 07/01/2023   Health care maintenance 06/30/2023   Acute deep vein thrombosis (DVT) of iliac vein of left lower extremity (HCC) 05/08/2023   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   Patellofemoral pain syndrome 08/01/2020   Bell's palsy 02/09/2018   Elevated liver enzymes 11/29/2013   Dyslipidemia 01/25/2013   Hyperglycemia 01/25/2013   Multiple lipomas 01/25/2013   Morbid obesity (HCC) 05/07/2010   LOW BACK PAIN, CHRONIC 11/28/2008   Alopecia 08/03/2008   Herpes zoster 12/01/2006   PAP SMEAR, ABNORMAL 12/01/2006   Human  immunodeficiency virus (HIV) disease (HCC) 09/14/2006    PCP: Claiborne Rigg, NP  REFERRING PROVIDER: Richardean Sale, DO  REFERRING DIAG: Chronic bilateral low back pain with bilateral sciatica [M54.42, M54.41, G89.29], Neck pain [M54.2], Right leg pain [M79.604], Bilateral hip pain [M25.551, M25.552]   THERAPY DIAG:  Chronic bilateral low back pain with bilateral sciatica  Neck pain  Right leg pain  Bilateral hip pain  Rationale for Evaluation and Treatment: Rehabilitation  ONSET DATE: 2+ years  SUBJECTIVE:   SUBJECTIVE STATEMENT: 02/16/2024 Pt states she felt okay after last session. Continues to have LE pain. Does feel like PT is improving pain some. Her neck bothers her with reaching and looking up but would like to continue focus on hips.    EVAL: Patient reports to PT with primary concern of right-sided low back and right leg pain with unknown onset. She feels that she has the most difficulty with bending her knees and lower body dressing. She sometimes wakes up at night d/t pain especially with laying on her side or back.   PERTINENT HISTORY: Relevant PMHx includes HIV, DVT of L LE, Obesity, Patellofemoral pain syndrome   PAIN:  Are you having pain?  Yes: NPRS scale: 5/10 current, 7/10 worst   Pain location: right-sided low back, BIL hip/groin, right knee Pain description: tight, burning, aching Aggravating factors: bending, overhead reaching, working   Relieving factors: medicine as needed  PRECAUTIONS: None  RED FLAGS: None   WEIGHT BEARING RESTRICTIONS: No  FALLS:  Has patient fallen in last 6 months? No  LIVING ENVIRONMENT: Lives with: lives with their family and lives with their spouse Lives in: House/apartment Stairs: No  OCCUPATION: works at [employer]   PLOF: Independent  PATIENT GOALS: To have less pain with daily activity  NEXT MD VISIT: 02/23/24 with referring provider   OBJECTIVE:  Note: Objective measures were completed at  Evaluation unless otherwise noted.  DIAGNOSTIC FINDINGS:   04/04/2023 Lumbar Spine X-Ray   FINDINGS: No fracture, dislocation or subluxation. No spondylolisthesis. No osteolytic or osteoblastic changes.   Degenerative disc disease noted with disc space narrowing and marginal osteophytes at each lumbar level.   IMPRESSION: Multilevel degenerative changes. No acute osseous abnormalities.   PATIENT SURVEYS:  LEFS 18/80 = 32% perceived functional ability   COGNITION: Overall cognitive status: Within functional limits for tasks assessed     MUSCLE LENGTH: Hamstrings (SLR): Right 35 deg; Left 50 deg  POSTURE: rounded shoulders and forward head  PALPATION: Increased muscle turgor throughout BIL thoracolumbar paraspinal mm; moderate pain and tenderness throughout L1-L5 CPA, with decreased spinal mobility from L3-S1.   LUMBAR ROM:   Active  A/PROM  eval  Flexion 60%  Extension 50%  Right lateral flexion 60%  Left lateral flexion 50%  Right rotation WFL  Left rotation WFL   (Blank rows = not tested)   % of full AROM    LOWER EXTREMITY ROM:  Active ROM Right eval Left eval  Hip flexion    Hip extension    Hip abduction    Hip adduction    Hip internal rotation    Hip external rotation    Knee flexion    Knee extension    Ankle dorsiflexion    Ankle plantarflexion    Ankle inversion    Ankle eversion     (Blank rows = not tested)  LOWER EXTREMITY MMT:  MMT Right eval Left eval  Hip flexion 4+ 4+  Hip extension    Hip abduction 4+ 4+  Hip adduction 4 4  Hip internal rotation 4- 4  Hip external rotation 4 4+  Knee flexion 5 5  Knee extension 5 5  Ankle dorsiflexion 5 5  Ankle plantarflexion    Ankle inversion    Ankle eversion     (Blank rows = not tested)                                                                                                                                 TREATMENT DATE:  [facility] Adult PT Treatment:                                                 DATE: 02/16/24 Therapeutic Exercise: Paced walking for warm up 5x25ft w/ break  Seated march 3x30secBIL cues  for [employer] 2x12 cues for breath control Hooklying clam green band 3x8 HEP update + education/handout  Neuromuscular re-ed: Bosu TKE push 2x8 BIL cues for quad/hamstring co contraction Mini lunge at counter x10 BIL cues for postural stability    [facility] Adult PT Treatment:                                                DATE: 02/09/24 Therapeutic Exercise: Nustep level 5 x 5 mins while planning session with patient Seated hamstring stretch 2x30" BIL Seated marching with TA activation 2x30" Seated hip adduction with ball and YTB at ankles for hip IR 2x10 Bridges 2x10 Supine hip adduction ball squeeze 5" hold 2x10 Sidelying clamshell 2x10 BIL STS no UE 2x10   [facility] Adult PT Treatment:                                                DATE: 02/02/24   Initial evaluation: see patient education and home exercise program as noted below      PATIENT EDUCATION:  Education details: rationale for interventions, HEP  Person educated: Patient Education method: Explanation, Demonstration, Tactile cues, Verbal cues Education comprehension: verbalized understanding, returned demonstration, verbal cues required, tactile cues required, and needs further education     HOME EXERCISE PROGRAM: Access Code: [email or web address] URL: [email or web address] Date: 02/16/2024 Prepared by: Fransisco Hertz  Exercises - Supine Bridge  - 2-3 x daily - 1 sets - 8 reps - Seated March  - 2-3 x daily - 1 sets - 8 reps - Standing Partial Lunge  - 2-3 x daily - 1 sets - 6 reps  ASSESSMENT:  CLINICAL IMPRESSION: 02/16/2024 Pt arrives w/ report of modest improvements in pain, remains about 5/10 at present mostly in R knee/hip. Did discuss neck assessment but pt states she would prefer to focus treatment on back/LE for now. She does well with volume progressions for  familiar exercises and progressions for closed chain LE stability. No adverse events, reports muscle fatigue as expected but denies increase in pain. Recommend continuing along current POC in order to address relevant deficits and improve functional tolerance. Pt departs today's session in no acute distress, all voiced questions/concerns addressed appropriately from PT perspective.     EVAL: Lovena is a 48 y.o. female who was seen today for physical therapy evaluation and treatment for persistent low back pain and R LE pain.. She is demonstrating decreased LQ MMT scores, decreased R>L hamstring muscle length, decreased LS AROM and decreased LS mobility with CPA assessment. She has related pain and difficulty with squatting, transfers, lower body dressing, and prolonged weight bearing activities as needed for household and occupational duties. She additionally has impaired sleep quality.   OBJECTIVE IMPAIRMENTS: decreased activity tolerance, decreased ROM, decreased strength, postural dysfunction, and pain.   ACTIVITY LIMITATIONS: lifting, bending, squatting, sleeping, stairs, and dressing  PARTICIPATION LIMITATIONS: cleaning, shopping, community activity, and occupation  PERSONAL FACTORS: Past/current experiences, Time since onset of injury/illness/exacerbation, and 1-2 comorbidities: Relevant PMHx includes HIV, DVT of L LE, Obesity, Patellofemoral pain syndrome   are also affecting patient's functional outcome.   REHAB POTENTIAL: Fair    CLINICAL DECISION MAKING: Evolving/moderate complexity  EVALUATION COMPLEXITY: Moderate   GOALS: Goals reviewed  with patient? Yes  SHORT TERM GOALS: Target date: 03/01/2024   Patient will be independent with initial home program at least 4x/week.  Goal status: INITIAL  2.  Patient will demonstrate at least 4+/5 MMT with LE strength testing Baseline: see objective measures  Goal status: INITIAL     LONG TERM GOALS: Target date:  03/29/2024    Patient will report improved overall functional ability with LEFS score of 50/80 or greater.  Baseline: 18/80 Goal status: INITIAL  2.  Patient will demonstrate improved R hamstring flexibility by at least 15 degrees.  Baseline: see objective measures  Goal status: INITIAL  3.  Patient will report 5/10 pain at worst with with daily activities and work-related activities Baseline: 9/10 Goal status: INITIAL  4.  Patient will demonstrate ability to perform floor to waist lifting of at least 20# using appropriate body mechanics and with no more than minimal pain in order to safely perform normal daily/occupational tasks.  Goal status: INITIAL   PLAN:  PT FREQUENCY: 1-2x/week  PT DURATION: 8 weeks  PLANNED INTERVENTIONS: 97164- PT Re-evaluation, 97110-Therapeutic exercises, 97530- Therapeutic activity, [id number]- Neuromuscular re-education, 97535- Self Care, 91478- Manual therapy, [phone]- Aquatic Therapy, [id number]- Electrical stimulation (unattended), [phone]- Electrical stimulation (manual), [id number]- Traction (mechanical), Patient/Family education, Taping, Dry Needling, Joint mobilization, Joint manipulation, Spinal manipulation, Spinal mobilization, Cryotherapy, and Moist heat  PLAN FOR NEXT SESSION:  assess neck pain and associated objective measures as appropriate per pt preference; Lower quarter mobility and pain modulation via manual therapy, aerobic activities, AAROM/AROM, and modalities   Ashley Murrain PT, DPT 02/16/2024 9:58 AM

## 2024-02-16 ENCOUNTER — Ambulatory Visit: Payer: Self-pay | Admitting: Physical Therapy

## 2024-02-16 ENCOUNTER — Encounter: Payer: Self-pay | Admitting: Physical Therapy

## 2024-02-16 DIAGNOSIS — M542 Cervicalgia: Secondary | ICD-10-CM

## 2024-02-16 DIAGNOSIS — M79604 Pain in right leg: Secondary | ICD-10-CM

## 2024-02-16 DIAGNOSIS — G8929 Other chronic pain: Secondary | ICD-10-CM

## 2024-02-16 DIAGNOSIS — M25551 Pain in right hip: Secondary | ICD-10-CM

## 2024-02-19 ENCOUNTER — Ambulatory Visit: Payer: Self-pay

## 2024-02-19 DIAGNOSIS — G8929 Other chronic pain: Secondary | ICD-10-CM

## 2024-02-19 NOTE — Therapy (Signed)
 OUTPATIENT PHYSICAL THERAPY TREATMENT NOTE   Patient Name: Kathleen Savage MRN: [id number] DOB:1976-05-24, 48 y.o., female Today's Date: 02/19/2024  END OF SESSION:  PT End of Session - 02/19/24 1014     Visit Number 4    Number of Visits 16    Date for PT Re-Evaluation 03/29/24    Authorization Type CAFA    PT Start Time 1001    PT Stop Time 1040    PT Time Calculation (min) 39 min    Activity Tolerance Patient tolerated treatment well    Behavior During Therapy WFL for tasks assessed/performed                Past Medical History:  Diagnosis Date   Glucose intolerance (pre-diabetes)    HIV infection (HCC) 2005   Hyperlipidemia    Past Surgical History:  Procedure Laterality Date   LOWER EXTREMITY VENOGRAPHY Left 05/12/2023   Procedure: LOWER EXTREMITY VENOGRAPHY;  Surgeon: Nada Libman, MD;  Location: [facility];  Service: Cardiovascular;  Laterality: Left;   PERIPHERAL VASCULAR BALLOON ANGIOPLASTY  05/12/2023   Procedure: PERIPHERAL VASCULAR BALLOON ANGIOPLASTY;  Surgeon: Nada Libman, MD;  Location: [facility];  Service: Cardiovascular;;   PERIPHERAL VASCULAR THROMBECTOMY N/A 05/12/2023   Procedure: MECHANICAL THROMBECTOMY;  Surgeon: Nada Libman, MD;  Location: [facility];  Service: Cardiovascular;  Laterality: N/A;   Patient Active Problem List   Diagnosis Date Noted   Encounter for immunization 01/12/2024   Screening examination for venereal disease 07/01/2023   Medication monitoring encounter 07/01/2023   Health care maintenance 06/30/2023   Acute deep vein thrombosis (DVT) of iliac vein of left lower extremity (HCC) 05/08/2023   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   Patellofemoral pain syndrome 08/01/2020   Bell's palsy 02/09/2018   Elevated liver enzymes 11/29/2013   Dyslipidemia 01/25/2013   Hyperglycemia 01/25/2013   Multiple lipomas 01/25/2013   Morbid obesity (HCC) 05/07/2010   LOW BACK PAIN, CHRONIC 11/28/2008    Alopecia 08/03/2008   Herpes zoster 12/01/2006   PAP SMEAR, ABNORMAL 12/01/2006   Human immunodeficiency virus (HIV) disease (HCC) 09/14/2006    PCP: Claiborne Rigg, NP  REFERRING PROVIDER: Richardean Sale, DO  REFERRING DIAG: Chronic bilateral low back pain with bilateral sciatica [M54.42, M54.41, G89.29], Neck pain [M54.2], Right leg pain [M79.604], Bilateral hip pain [M25.551, M25.552]   THERAPY DIAG:  Chronic bilateral low back pain with bilateral sciatica  Rationale for Evaluation and Treatment: Rehabilitation  ONSET DATE: 2+ years  SUBJECTIVE:   SUBJECTIVE STATEMENT: 02/19/2024 Pt states she felt okay after last session. She feels that she has more stiffness today and feels that her toilet at home is too low, because its very hard to get on/off of it.    EVAL: Patient reports to PT with primary concern of right-sided low back and right leg pain with unknown onset. She feels that she has the most difficulty with bending her knees and lower body dressing. She sometimes wakes up at night d/t pain especially with laying on her side or back.   PERTINENT HISTORY: Relevant PMHx includes HIV, DVT of L LE, Obesity, Patellofemoral pain syndrome   PAIN:  Are you having pain?  Yes: NPRS scale: 5/10 current, 7/10 worst   Pain location: right-sided low back, BIL hip/groin, right knee Pain description: tight, burning, aching Aggravating factors: bending, overhead reaching, working   Relieving factors: medicine as needed  PRECAUTIONS: None  RED FLAGS: None   WEIGHT BEARING  RESTRICTIONS: No  FALLS:  Has patient fallen in last 6 months? No  LIVING ENVIRONMENT: Lives with: lives with their family and lives with their spouse Lives in: House/apartment Stairs: No  OCCUPATION: works at [employer]   PLOF: Independent  PATIENT GOALS: To have less pain with daily activity  NEXT MD VISIT: 02/23/24 with referring provider   OBJECTIVE:  Note: Objective measures were  completed at Evaluation unless otherwise noted.  DIAGNOSTIC FINDINGS:   04/04/2023 Lumbar Spine X-Ray   FINDINGS: No fracture, dislocation or subluxation. No spondylolisthesis. No osteolytic or osteoblastic changes.   Degenerative disc disease noted with disc space narrowing and marginal osteophytes at each lumbar level.   IMPRESSION: Multilevel degenerative changes. No acute osseous abnormalities.   PATIENT SURVEYS:  LEFS 18/80 = 32% perceived functional ability   COGNITION: Overall cognitive status: Within functional limits for tasks assessed     MUSCLE LENGTH: Hamstrings (SLR): Right 35 deg; Left 50 deg  POSTURE: rounded shoulders and forward head  PALPATION: Increased muscle turgor throughout BIL thoracolumbar paraspinal mm; moderate pain and tenderness throughout L1-L5 CPA, with decreased spinal mobility from L3-S1.   LUMBAR ROM:   Active  A/PROM  eval  Flexion 60%  Extension 50%  Right lateral flexion 60%  Left lateral flexion 50%  Right rotation WFL  Left rotation WFL   (Blank rows = not tested)   % of full AROM    LOWER EXTREMITY ROM:  Active ROM Right eval Left eval  Hip flexion    Hip extension    Hip abduction    Hip adduction    Hip internal rotation    Hip external rotation    Knee flexion    Knee extension    Ankle dorsiflexion    Ankle plantarflexion    Ankle inversion    Ankle eversion     (Blank rows = not tested)  LOWER EXTREMITY MMT:  MMT Right eval Left eval  Hip flexion 4+ 4+  Hip extension    Hip abduction 4+ 4+  Hip adduction 4 4  Hip internal rotation 4- 4  Hip external rotation 4 4+  Knee flexion 5 5  Knee extension 5 5  Ankle dorsiflexion 5 5  Ankle plantarflexion    Ankle inversion    Ankle eversion     (Blank rows = not tested)                                                                                                                                 TREATMENT DATE:   [facility] Adult PT Treatment:                                                 DATE: 02/19/2024  Therapeutic Exercise:  Nustep level 2 x 5 mins  while  planning session with patient Seated march 3 x 5 GTB Bridge 2x12 cues for breath control Hooklying clam green band 2 x 12 STS from high-low table 2 x 8   Neuromuscular re-ed: Bosu TKE push 2x8 BIL cues for quad/hamstring co contraction Lateral lunge with slider 2 x 8 at counter for UE support Retro lunge with slider 2 x 8 at counter for UE support   [facility] Adult PT Treatment:                                                DATE: 02/16/24 Therapeutic Exercise: Paced walking for warm up 5x71ft w/ break  Seated march 3x30secBIL cues for [employer] 2x12 cues for breath control Hooklying clam green band 3x8 HEP update + education/handout  Neuromuscular re-ed: Bosu TKE push 2x8 BIL cues for quad/hamstring co contraction Mini lunge at counter x10 BIL cues for postural stability    [facility] Adult PT Treatment:                                                DATE: 02/09/24 Therapeutic Exercise: Nustep level 5 x 5 mins while planning session with patient Seated hamstring stretch 2x30" BIL Seated marching with TA activation 2x30" Seated hip adduction with ball and YTB at ankles for hip IR 2x10 Bridges 2x10 Supine hip adduction ball squeeze 5" hold 2x10 Sidelying clamshell 2x10 BIL STS no UE 2x10   [facility] Adult PT Treatment:                                                DATE: 02/02/24   Initial evaluation: see patient education and home exercise program as noted below      PATIENT EDUCATION:  Education details: rationale for interventions, HEP  Person educated: Patient Education method: Explanation, Demonstration, Tactile cues, Verbal cues Education comprehension: verbalized understanding, returned demonstration, verbal cues required, tactile cues required, and needs further education     HOME EXERCISE PROGRAM: Access Code: [email or web address] URL:  [email or web address] Date: 02/16/2024 Prepared by: Fransisco Hertz  Exercises - Supine Bridge  - 2-3 x daily - 1 sets - 8 reps - Seated March  - 2-3 x daily - 1 sets - 8 reps - Standing Partial Lunge  - 2-3 x daily - 1 sets - 6 reps  ASSESSMENT:  CLINICAL IMPRESSION: 02/19/2024 Kortni is continues to tolerate gradual progress of LQ strengthening program. Added squats to high-low table at end of today's session and updated HEP to include squats to chair. Discussed possible solutions to raise toilet seat height as well. We will continue to progress volume within current POC.    EVAL: Micheale is a 48 y.o. female who was seen today for physical therapy evaluation and treatment for persistent low back pain and R LE pain.. She is demonstrating decreased LQ MMT scores, decreased R>L hamstring muscle length, decreased LS AROM and decreased LS mobility with CPA assessment. She has related pain and difficulty with squatting, transfers, lower body dressing, and prolonged weight bearing activities as needed for household and occupational duties. She additionally has impaired sleep  quality.   OBJECTIVE IMPAIRMENTS: decreased activity tolerance, decreased ROM, decreased strength, postural dysfunction, and pain.   ACTIVITY LIMITATIONS: lifting, bending, squatting, sleeping, stairs, and dressing  PARTICIPATION LIMITATIONS: cleaning, shopping, community activity, and occupation  PERSONAL FACTORS: Past/current experiences, Time since onset of injury/illness/exacerbation, and 1-2 comorbidities: Relevant PMHx includes HIV, DVT of L LE, Obesity, Patellofemoral pain syndrome   are also affecting patient's functional outcome.   REHAB POTENTIAL: Fair    CLINICAL DECISION MAKING: Evolving/moderate complexity  EVALUATION COMPLEXITY: Moderate   GOALS: Goals reviewed with patient? Yes  SHORT TERM GOALS: Target date: 03/01/2024   Patient will be independent with initial home program at least 4x/week.   Goal status: INITIAL  2.  Patient will demonstrate at least 4+/5 MMT with LE strength testing Baseline: see objective measures  Goal status: INITIAL     LONG TERM GOALS: Target date: 03/29/2024    Patient will report improved overall functional ability with LEFS score of 50/80 or greater.  Baseline: 18/80 Goal status: INITIAL  2.  Patient will demonstrate improved R hamstring flexibility by at least 15 degrees.  Baseline: see objective measures  Goal status: INITIAL  3.  Patient will report 5/10 pain at worst with with daily activities and work-related activities Baseline: 9/10 Goal status: INITIAL  4.  Patient will demonstrate ability to perform floor to waist lifting of at least 20# using appropriate body mechanics and with no more than minimal pain in order to safely perform normal daily/occupational tasks.  Goal status: INITIAL   PLAN:  PT FREQUENCY: 1-2x/week  PT DURATION: 8 weeks  PLANNED INTERVENTIONS: 97164- PT Re-evaluation, 97110-Therapeutic exercises, 97530- Therapeutic activity, [id number]- Neuromuscular re-education, 97535- Self Care, 16109- Manual therapy, [phone]- Aquatic Therapy, G0283- Electrical stimulation (unattended), [phone]- Electrical stimulation (manual), [id number]- Traction (mechanical), Patient/Family education, Taping, Dry Needling, Joint mobilization, Joint manipulation, Spinal manipulation, Spinal mobilization, Cryotherapy, and Moist heat  PLAN FOR NEXT SESSION:  assess neck pain and associated objective measures as appropriate per pt preference; Lower quarter mobility and pain modulation via manual therapy, aerobic activities, AAROM/AROM, and modalities   Mauri Reading, PT, DPT  02/19/2024 12:04 PM

## 2024-02-22 NOTE — Progress Notes (Deleted)
    Ben Jackson D.[facility] [address] [place] [address] Phone: [phone]   Assessment and Plan:     There are no diagnoses linked to this encounter.  ***   Pertinent previous records reviewed include ***    Follow Up: ***     Subjective:   I, Kathleen Savage, am serving as a Neurosurgeon for Doctor Ulysees Gander   Chief Complaint: bilat leg  and back pain    HPI:    12/29/2023 Patient is a 48 year old female with bilat leg and bakl  pain. Patient states pain for about 2 years. No MOI. Pain starts at the neck and goes down her low back to her hip , groin and then the knee. Tylenol with codein for the pain . Didn't help with the pain made her sleepy. No numbness and tingling. Just pain. She has a hard time standing up straight, antalgic gait. Constant pain. 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 She is currently moving . Pain is in her groin and knees   02/23/2024 Patient states   Relevant Historical Information: History of DVT in left leg with recent completion of 75-month course of anticoagulation, HIV,  Additional pertinent review of systems negative.   Current Outpatient Medications:    acetaminophen-codeine (TYLENOL #3) 300-30 MG tablet, Take 1-2 tablets by mouth every 8 (eight) hours as needed for moderate pain (pain score 4-6)., Disp: 60 tablet, Rfl: 0   bictegravir-emtricitabine-tenofovir AF (BIKTARVY) 50-200-25 MG TABS tablet, Take 1 tablet by mouth daily., Disp: 30 tablet, Rfl: 11   DICLOFENAC PO, Take 100 mg by mouth daily., Disp: , Rfl:    gabapentin (NEURONTIN) 300 MG capsule, Take 1 capsule (300 mg total) by mouth 3 (three) times daily., Disp: 90 capsule, Rfl: 3   methylPREDNISolone (MEDROL DOSEPAK) 4 MG TBPK tablet, Take 6 tablets on day 1.  Take 5 tablets on day 2.  Take 4 tablets on day 3.  Take 3  tablets on day 4.  Take 2 tablets on day 5.  Take 1 tablet on day 6., Disp: 21 tablet, Rfl: 0   topiramate (TOPAMAX) 25 MG tablet, Take 1 tablet (25 mg total) by mouth at bedtime., Disp: 90 tablet, Rfl: 3   Objective:     There were no vitals filed for this visit.    There is no height or weight on file to calculate BMI.    Physical Exam:    ***   Electronically signed by:  Marshall Skeeter D[facility] 7:33 AM 02/22/24

## 2024-02-23 ENCOUNTER — Ambulatory Visit: Payer: Self-pay

## 2024-02-23 ENCOUNTER — Ambulatory Visit: Payer: Self-pay | Admitting: Sports Medicine

## 2024-02-29 NOTE — Progress Notes (Signed)
    Ben  D.[facility] [address] [place] [address] Phone: [phone]   Assessment and Plan:     There are no diagnoses linked to this encounter.  ***   Pertinent previous records reviewed include ***    Follow Up: ***     Subjective:   I, Dirk Vanaman, am serving as a Neurosurgeon for Doctor Ulysees Gander   Chief Complaint: bilat leg  and back pain    HPI:    12/29/2023 Patient is a 48 year old female with bilat leg and bakl  pain. Patient states pain for about 2 years. No MOI. Pain starts at the neck and goes down her low back to her hip , groin and then the knee. Tylenol  with codein for the pain . Didn't help with the pain made her sleepy. No numbness and tingling. Just pain. She has a hard time standing up straight, antalgic gait. Constant pain. 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 She is currently moving . Pain is in her groin and knees   02/29/2024 Patient states   Relevant Historical Information: History of DVT in left leg with recent completion of 10-month course of anticoagulation, HIV,  Additional pertinent review of systems negative.   Current Outpatient Medications:    acetaminophen -codeine  (TYLENOL  #3) 300-30 MG tablet, Take 1-2 tablets by mouth every 8 (eight) hours as needed for moderate pain (pain score 4-6)., Disp: 60 tablet, Rfl: 0   bictegravir-emtricitabine-tenofovir  AF (BIKTARVY ) 50-200-25 MG TABS tablet, Take 1 tablet by mouth daily., Disp: 30 tablet, Rfl: 11   DICLOFENAC  PO, Take 100 mg by mouth daily., Disp: , Rfl:    gabapentin  (NEURONTIN ) 300 MG capsule, Take 1 capsule (300 mg total) by mouth 3 (three) times daily., Disp: 90 capsule, Rfl: 3   methylPREDNISolone  (MEDROL  DOSEPAK) 4 MG TBPK tablet, Take 6 tablets on day 1.  Take 5 tablets on day 2.  Take 4 tablets on day 3.  Take 3  tablets on day 4.  Take 2 tablets on day 5.  Take 1 tablet on day 6., Disp: 21 tablet, Rfl: 0   topiramate  (TOPAMAX ) 25 MG tablet, Take 1 tablet (25 mg total) by mouth at bedtime., Disp: 90 tablet, Rfl: 3   Objective:     There were no vitals filed for this visit.    There is no height or weight on file to calculate BMI.    Physical Exam:    ***   Electronically signed by:  Marshall Skeeter [facility] 12:57 PM 02/29/24

## 2024-03-01 ENCOUNTER — Encounter: Payer: Self-pay | Admitting: Physical Medicine and Rehabilitation

## 2024-03-01 ENCOUNTER — Ambulatory Visit: Payer: Self-pay | Admitting: Physical Therapy

## 2024-03-01 ENCOUNTER — Other Ambulatory Visit: Payer: Self-pay

## 2024-03-01 ENCOUNTER — Encounter: Payer: Self-pay | Admitting: Physical Therapy

## 2024-03-01 ENCOUNTER — Ambulatory Visit (INDEPENDENT_AMBULATORY_CARE_PROVIDER_SITE_OTHER): Payer: Self-pay | Admitting: Sports Medicine

## 2024-03-01 ENCOUNTER — Ambulatory Visit (INDEPENDENT_AMBULATORY_CARE_PROVIDER_SITE_OTHER): Payer: Self-pay

## 2024-03-01 VITALS — HR 98 | Ht 61.0 in | Wt 253.0 lb

## 2024-03-01 DIAGNOSIS — G8929 Other chronic pain: Secondary | ICD-10-CM

## 2024-03-01 DIAGNOSIS — M25551 Pain in right hip: Secondary | ICD-10-CM

## 2024-03-01 DIAGNOSIS — M25561 Pain in right knee: Secondary | ICD-10-CM

## 2024-03-01 DIAGNOSIS — M25562 Pain in left knee: Secondary | ICD-10-CM

## 2024-03-01 DIAGNOSIS — M542 Cervicalgia: Secondary | ICD-10-CM

## 2024-03-01 DIAGNOSIS — M25552 Pain in left hip: Secondary | ICD-10-CM

## 2024-03-01 DIAGNOSIS — M79604 Pain in right leg: Secondary | ICD-10-CM

## 2024-03-01 DIAGNOSIS — M5441 Lumbago with sciatica, right side: Secondary | ICD-10-CM

## 2024-03-01 DIAGNOSIS — M51362 Other intervertebral disc degeneration, lumbar region with discogenic back pain and lower extremity pain: Secondary | ICD-10-CM

## 2024-03-01 MED ORDER — MELOXICAM 15 MG PO TABS
15.0000 mg | ORAL_TABLET | Freq: Every day | ORAL | 0 refills | Status: DC
  Filled 2024-03-01: qty 30, 30d supply, fill #0

## 2024-03-01 NOTE — Patient Instructions (Signed)
 Lumbar MRI  - Start meloxicam  15 mg daily x2 weeks.  If still having pain after 2 weeks, complete 3rd-week of NSAID. May use remaining NSAID as needed once daily for pain control.  Do not to use additional over-the-counter NSAIDs (ibuprofen, naproxen, Advil, Aleve, etc.) while taking prescription NSAIDs.  May use Tylenol  [phone] mg 2 to 3 times a day for breakthrough pain. Follow up 5 days after to discuss results

## 2024-03-01 NOTE — Therapy (Signed)
 OUTPATIENT PHYSICAL THERAPY TREATMENT NOTE   Patient Name: Kathleen Savage MRN: [id number] DOB:[birth year], 48 y.o., female Today's Date: 03/01/2024  END OF SESSION:  PT End of Session - 03/01/24 0831     Visit Number 5    Number of Visits 16    Date for PT Re-Evaluation 03/29/24    Authorization Type CAFA    PT Start Time 0831    PT Stop Time 0910    PT Time Calculation (min) 39 min    Activity Tolerance Patient tolerated treatment well                 Past Medical History:  Diagnosis Date   Glucose intolerance (pre-diabetes)    HIV infection (HCC) 2005   Hyperlipidemia    Past Surgical History:  Procedure Laterality Date   LOWER EXTREMITY VENOGRAPHY Left 05/12/2023   Procedure: LOWER EXTREMITY VENOGRAPHY;  Surgeon: Margherita Shell, MD;  Location: [facility];  Service: Cardiovascular;  Laterality: Left;   PERIPHERAL VASCULAR BALLOON ANGIOPLASTY  05/12/2023   Procedure: PERIPHERAL VASCULAR BALLOON ANGIOPLASTY;  Surgeon: Margherita Shell, MD;  Location: [facility];  Service: Cardiovascular;;   PERIPHERAL VASCULAR THROMBECTOMY N/A 05/12/2023   Procedure: MECHANICAL THROMBECTOMY;  Surgeon: Margherita Shell, MD;  Location: [facility];  Service: Cardiovascular;  Laterality: N/A;   Patient Active Problem List   Diagnosis Date Noted   Encounter for immunization 01/12/2024   Screening examination for venereal disease 07/01/2023   Medication monitoring encounter 07/01/2023   Health care maintenance 06/30/2023   Acute deep vein thrombosis (DVT) of iliac vein of left lower extremity (HCC) 05/08/2023   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   Patellofemoral pain syndrome 08/01/2020   Bell's palsy 02/09/2018   Elevated liver enzymes 11/29/2013   Dyslipidemia 01/25/2013   Hyperglycemia 01/25/2013   Multiple lipomas 01/25/2013   Morbid obesity (HCC) 05/07/2010   LOW BACK PAIN, CHRONIC 11/28/2008   Alopecia 08/03/2008   Herpes zoster 12/01/2006   PAP  SMEAR, ABNORMAL 12/01/2006   Human immunodeficiency virus (HIV) disease (HCC) 09/14/2006    PCP: Collins Dean, NP  REFERRING PROVIDER: Ulysees Gander, DO  REFERRING DIAG: Chronic bilateral low back pain with bilateral sciatica [M54.42, M54.41, G89.29], Neck pain [M54.2], Right leg pain [M79.604], Bilateral hip pain [M25.551, M25.552]   THERAPY DIAG:  Chronic bilateral low back pain with bilateral sciatica  Neck pain  Right leg pain  Bilateral hip pain  Rationale for Evaluation and Treatment: Rehabilitation  ONSET DATE: 2+ years  SUBJECTIVE:   SUBJECTIVE STATEMENT: 03/01/2024 Pt states she is feeling good today, no pain. States she did okay after last session, was a bit under the weather which affected HEP some. Still having ups and downs with symptoms, tend to be worst with long shifts at work. Does feel like she is having more good days compared to start of care.    EVAL: Patient reports to PT with primary concern of right-sided low back and right leg pain with unknown onset. She feels that she has the most difficulty with bending her knees and lower body dressing. She sometimes wakes up at night d/t pain especially with laying on her side or back.   PERTINENT HISTORY: Relevant PMHx includes HIV, DVT of L LE, Obesity, Patellofemoral pain syndrome   PAIN:  Are you having pain?  Yes: NPRS scale: 0/10 current, 9/10 worst   Pain location: right-sided low back, BIL hip/groin, right knee Pain description: tight, burning, aching Aggravating  factors: bending, overhead reaching, working   Relieving factors: medicine as needed  PRECAUTIONS: None  RED FLAGS: None   WEIGHT BEARING RESTRICTIONS: No  FALLS:  Has patient fallen in last 6 months? No  LIVING ENVIRONMENT: Lives with: lives with their family and lives with their spouse Lives in: House/apartment Stairs: No  OCCUPATION: works at [employer]   PLOF: Independent  PATIENT GOALS: To have less pain with  daily activity  NEXT MD VISIT: 03/01/24  OBJECTIVE:  Note: Objective measures were completed at Evaluation unless otherwise noted.  DIAGNOSTIC FINDINGS:   04/04/2023 Lumbar Spine X-Ray   FINDINGS: No fracture, dislocation or subluxation. No spondylolisthesis. No osteolytic or osteoblastic changes.   Degenerative disc disease noted with disc space narrowing and marginal osteophytes at each lumbar level.   IMPRESSION: Multilevel degenerative changes. No acute osseous abnormalities.   PATIENT SURVEYS:  LEFS 18/80 = 32% perceived functional ability   COGNITION: Overall cognitive status: Within functional limits for tasks assessed     MUSCLE LENGTH: Hamstrings (SLR): Right 35 deg; Left 50 deg  POSTURE: rounded shoulders and forward head  PALPATION: Increased muscle turgor throughout BIL thoracolumbar paraspinal mm; moderate pain and tenderness throughout L1-L5 CPA, with decreased spinal mobility from L3-S1.   LUMBAR ROM:   Active  A/PROM  eval  Flexion 60%  Extension 50%  Right lateral flexion 60%  Left lateral flexion 50%  Right rotation WFL  Left rotation WFL   (Blank rows = not tested)   % of full AROM    LOWER EXTREMITY ROM:  Active ROM Right eval Left eval  Hip flexion    Hip extension    Hip abduction    Hip adduction    Hip internal rotation    Hip external rotation    Knee flexion    Knee extension    Ankle dorsiflexion    Ankle plantarflexion    Ankle inversion    Ankle eversion     (Blank rows = not tested)  LOWER EXTREMITY MMT:  MMT Right eval Left eval  Hip flexion 4+ 4+  Hip extension    Hip abduction 4+ 4+  Hip adduction 4 4  Hip internal rotation 4- 4  Hip external rotation 4 4+  Knee flexion 5 5  Knee extension 5 5  Ankle dorsiflexion 5 5  Ankle plantarflexion    Ankle inversion    Ankle eversion     (Blank rows = not tested)                                                                                                                                  TREATMENT DATE:   [facility] Adult PT Treatment:  DATE: 03/01/24 Therapeutic Exercise: Paced walking for warm up 5x9ft w/ break  STS from high low table x10 BW, 5# x10 Green band hamstring curl, seated x12 BIL HEP handout + education  Neuromuscular re-ed: Airex march 2x8 BIL weaning UE support, CGA cues for pacing and posture  Airex stepover x8 BIL, x8 w/ 5# DB cues for pacing and eccentric control    [facility] Adult PT Treatment:                                                DATE: 02/19/2024  Therapeutic Exercise:  Nustep level 2 x 5 mins  while planning session with patient Seated march 3 x 5 GTB Bridge 2x12 cues for breath control Hooklying clam green band 2 x 12 STS from high-low table 2 x 8   Neuromuscular re-ed: Bosu TKE push 2x8 BIL cues for quad/hamstring co contraction Lateral lunge with slider 2 x 8 at counter for UE support Retro lunge with slider 2 x 8 at counter for UE support   [facility] Adult PT Treatment:                                                DATE: 02/16/24 Therapeutic Exercise: Paced walking for warm up 5x27ft w/ break  Seated march 3x30secBIL cues for [employer] 2x12 cues for breath control Hooklying clam green band 3x8 HEP update + education/handout  Neuromuscular re-ed: Bosu TKE push 2x8 BIL cues for quad/hamstring co contraction Mini lunge at counter x10 BIL cues for postural stability   PATIENT EDUCATION:  Education details: rationale for interventions, HEP  Person educated: Patient Education method: Explanation, Demonstration, Tactile cues, Verbal cues Education comprehension: verbalized understanding, returned demonstration, verbal cues required, tactile cues required, and needs further education     HOME EXERCISE PROGRAM: Access Code: [email or web address] URL: [email or web address] Date: 03/01/2024 Prepared by: Mayme Spearman  Exercises - Supine  Bridge  - 2-3 x daily - 1 sets - 8 reps - Seated March  - 2-3 x daily - 1 sets - 8 reps - Standing Partial Lunge  - 2-3 x daily - 1 sets - 6 reps - Squat with Chair Touch  - 2-3 x weekly - 2 sets - 10 reps - Seated Hamstring Curl with Anchored Resistance  - 2-3 x weekly - 2 sets - 12 reps  ASSESSMENT:  CLINICAL IMPRESSION: 03/01/2024 Pt arrives w/o pain, reports fluctuating symptoms still but overall progressing with PT. Today we are able to progress for exercises targeting postural stability given pt report of difficulty on uneven surfaces. Also working on progressions for LE strengthening which pt tolerates well. No adverse events, pt reports some transient R hip discomfort with increased time in standing but resolves with rest. HEP update as above. Recommend continuing along current POC in order to address relevant deficits and improve functional tolerance. Pt departs today's session in no acute distress, all voiced questions/concerns addressed appropriately from PT perspective.     EVAL: Kathleen Savage is a 48 y.o. female who was seen today for physical therapy evaluation and treatment for persistent low back pain and R LE pain.. She is demonstrating decreased LQ MMT scores, decreased R>L hamstring muscle length, decreased LS AROM and decreased  LS mobility with CPA assessment. She has related pain and difficulty with squatting, transfers, lower body dressing, and prolonged weight bearing activities as needed for household and occupational duties. She additionally has impaired sleep quality.   OBJECTIVE IMPAIRMENTS: decreased activity tolerance, decreased ROM, decreased strength, postural dysfunction, and pain.   ACTIVITY LIMITATIONS: lifting, bending, squatting, sleeping, stairs, and dressing  PARTICIPATION LIMITATIONS: cleaning, shopping, community activity, and occupation  PERSONAL FACTORS: Past/current experiences, Time since onset of injury/illness/exacerbation, and 1-2 comorbidities: Relevant PMHx  includes HIV, DVT of L LE, Obesity, Patellofemoral pain syndrome   are also affecting patient's functional outcome.   REHAB POTENTIAL: Fair    CLINICAL DECISION MAKING: Evolving/moderate complexity  EVALUATION COMPLEXITY: Moderate   GOALS: Goals reviewed with patient? Yes  SHORT TERM GOALS: Target date: 03/01/2024   Patient will be independent with initial home program at least 4x/week.  03/01/24: reports good adherence overall Goal status: MET  2.  Patient will demonstrate at least 4+/5 MMT with LE strength testing Baseline: see objective measures  Goal status: ONGOING     LONG TERM GOALS: Target date: 03/29/2024    Patient will report improved overall functional ability with LEFS score of 50/80 or greater.  Baseline: 18/80 Goal status: INITIAL  2.  Patient will demonstrate improved R hamstring flexibility by at least 15 degrees.  Baseline: see objective measures  Goal status: INITIAL  3.  Patient will report 5/10 pain at worst with with daily activities and work-related activities Baseline: 9/10 Goal status: INITIAL  4.  Patient will demonstrate ability to perform floor to waist lifting of at least 20# using appropriate body mechanics and with no more than minimal pain in order to safely perform normal daily/occupational tasks.  Goal status: INITIAL   PLAN:  PT FREQUENCY: 1-2x/week  PT DURATION: 8 weeks  PLANNED INTERVENTIONS: 97164- PT Re-evaluation, 97110-Therapeutic exercises, 97530- Therapeutic activity, [id number]- Neuromuscular re-education, 97535- Self Care, 21308- Manual therapy, [phone]- Aquatic Therapy, [id number]- Electrical stimulation (unattended), [phone]- Electrical stimulation (manual), [id number]- Traction (mechanical), Patient/Family education, Taping, Dry Needling, Joint mobilization, Joint manipulation, Spinal manipulation, Spinal mobilization, Cryotherapy, and Moist heat  PLAN FOR NEXT SESSION:  assess neck pain and associated objective measures as appropriate  per pt preference; Lower quarter mobility and pain modulation via manual therapy, aerobic activities, AAROM/AROM, and modalities   Lovett Ruck PT, DPT 03/01/2024 9:15 AM

## 2024-03-02 ENCOUNTER — Encounter: Payer: Self-pay | Admitting: Sports Medicine

## 2024-03-03 NOTE — Therapy (Signed)
 OUTPATIENT PHYSICAL THERAPY TREATMENT NOTE   Patient Name: Kathleen Savage MRN: [id number] DOB:1976-04-06, 48 y.o., female Today's Date: 03/04/2024  END OF SESSION:  PT End of Session - 03/04/24 0914     Visit Number 6    Number of Visits 16    Date for PT Re-Evaluation 03/29/24    Authorization Type CAFA    PT Start Time 0916    PT Stop Time 0957    PT Time Calculation (min) 41 min    Activity Tolerance Patient tolerated treatment well                  Past Medical History:  Diagnosis Date   Glucose intolerance (pre-diabetes)    HIV infection (HCC) 2005   Hyperlipidemia    Past Surgical History:  Procedure Laterality Date   LOWER EXTREMITY VENOGRAPHY Left 05/12/2023   Procedure: LOWER EXTREMITY VENOGRAPHY;  Surgeon: Margherita Shell, MD;  Location: [facility];  Service: Cardiovascular;  Laterality: Left;   PERIPHERAL VASCULAR BALLOON ANGIOPLASTY  05/12/2023   Procedure: PERIPHERAL VASCULAR BALLOON ANGIOPLASTY;  Surgeon: Margherita Shell, MD;  Location: [facility];  Service: Cardiovascular;;   PERIPHERAL VASCULAR THROMBECTOMY N/A 05/12/2023   Procedure: MECHANICAL THROMBECTOMY;  Surgeon: Margherita Shell, MD;  Location: [facility];  Service: Cardiovascular;  Laterality: N/A;   Patient Active Problem List   Diagnosis Date Noted   Encounter for immunization 01/12/2024   Screening examination for venereal disease 07/01/2023   Medication monitoring encounter 07/01/2023   Health care maintenance 06/30/2023   Acute deep vein thrombosis (DVT) of iliac vein of left lower extremity (HCC) 05/08/2023   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   Patellofemoral pain syndrome 08/01/2020   Bell's palsy 02/09/2018   Elevated liver enzymes 11/29/2013   Dyslipidemia 01/25/2013   Hyperglycemia 01/25/2013   Multiple lipomas 01/25/2013   Morbid obesity (HCC) 05/07/2010   LOW BACK PAIN, CHRONIC 11/28/2008   Alopecia 08/03/2008   Herpes zoster 12/01/2006   PAP  SMEAR, ABNORMAL 12/01/2006   Human immunodeficiency virus (HIV) disease (HCC) 09/14/2006    PCP: Collins Dean, NP  REFERRING PROVIDER: Ulysees Gander, DO  REFERRING DIAG: Chronic bilateral low back pain with bilateral sciatica [M54.42, M54.41, G89.29], Neck pain [M54.2], Right leg pain [M79.604], Bilateral hip pain [M25.551, M25.552]   THERAPY DIAG:  Chronic bilateral low back pain with bilateral sciatica  Neck pain  Bilateral hip pain  Rationale for Evaluation and Treatment: Rehabilitation  ONSET DATE: 2+ years  SUBJECTIVE:   SUBJECTIVE STATEMENT: 03/04/2024 Pt states physician ordered an MRI, scheduled for first week of May. States she feels like PT is improving her energy, reducing pain levels during the day. Still having most of her pain when she goes to sleep, especially after work days. Felt some muscle soreness last session but no increase in pain     EVAL: Patient reports to PT with primary concern of right-sided low back and right leg pain with unknown onset. She feels that she has the most difficulty with bending her knees and lower body dressing. She sometimes wakes up at night d/t pain especially with laying on her side or back.   PERTINENT HISTORY: Relevant PMHx includes HIV, DVT of L LE, Obesity, Patellofemoral pain syndrome   PAIN:  Are you having pain?  Yes: NPRS scale: 5/10 current, 8/10 worst   Pain location: right-sided low back, BIL hip/groin, right knee Pain description: tight, burning, aching Aggravating factors: bending, overhead reaching, working  Relieving factors: medicine as needed  PRECAUTIONS: None  RED FLAGS: None   WEIGHT BEARING RESTRICTIONS: No  FALLS:  Has patient fallen in last 6 months? No  LIVING ENVIRONMENT: Lives with: lives with their family and lives with their spouse Lives in: House/apartment Stairs: No  OCCUPATION: works at [employer]   PLOF: Independent  PATIENT GOALS: To have less pain with daily  activity  NEXT MD VISIT: 03/01/24  OBJECTIVE:  Note: Objective measures were completed at Evaluation unless otherwise noted.  DIAGNOSTIC FINDINGS:   04/04/2023 Lumbar Spine X-Ray   FINDINGS: No fracture, dislocation or subluxation. No spondylolisthesis. No osteolytic or osteoblastic changes.   Degenerative disc disease noted with disc space narrowing and marginal osteophytes at each lumbar level.   IMPRESSION: Multilevel degenerative changes. No acute osseous abnormalities.   PATIENT SURVEYS:  LEFS 18/80 = 32% perceived functional ability   COGNITION: Overall cognitive status: Within functional limits for tasks assessed     MUSCLE LENGTH: Hamstrings (SLR): Right 35 deg; Left 50 deg  POSTURE: rounded shoulders and forward head  PALPATION: Increased muscle turgor throughout BIL thoracolumbar paraspinal mm; moderate pain and tenderness throughout L1-L5 CPA, with decreased spinal mobility from L3-S1.   LUMBAR ROM:   Active  A/PROM  eval  Flexion 60%  Extension 50%  Right lateral flexion 60%  Left lateral flexion 50%  Right rotation WFL  Left rotation WFL   (Blank rows = not tested)   % of full AROM    LOWER EXTREMITY ROM:  Active ROM Right eval Left eval  Hip flexion    Hip extension    Hip abduction    Hip adduction    Hip internal rotation    Hip external rotation    Knee flexion    Knee extension    Ankle dorsiflexion    Ankle plantarflexion    Ankle inversion    Ankle eversion     (Blank rows = not tested)  LOWER EXTREMITY MMT:  MMT Right eval Left eval  Hip flexion 4+ 4+  Hip extension    Hip abduction 4+ 4+  Hip adduction 4 4  Hip internal rotation 4- 4  Hip external rotation 4 4+  Knee flexion 5 5  Knee extension 5 5  Ankle dorsiflexion 5 5  Ankle plantarflexion    Ankle inversion    Ankle eversion     (Blank rows = not tested)                                                                                                                                  TREATMENT DATE:   [facility] Adult PT Treatment:                                                DATE: 03/04/24  Therapeutic Exercise: Paced walking for warm up 5x154ft w/ break  Blue band hamstring curl 2x8 BIL Green band hip 45 deg kickback with slider, 2x8 BIL cues for posture/form Trial LTR/SKTC per pt request for low back stretches - discontinued due to transient increase in discomfort despite modification/cues HEP discussion/education  Therapeutic Activity: STS 2x10 from lowest mat 5# suitcase carry 2x44ft BIL cues for posture/pacing    [facility] Adult PT Treatment:                                                DATE: 03/01/24 Therapeutic Exercise: Paced walking for warm up 5x27ft w/ break  STS from high low table x10 BW, 5# x10 Green band hamstring curl, seated x12 BIL HEP handout + education  Neuromuscular re-ed: Airex march 2x8 BIL weaning UE support, CGA cues for pacing and posture  Airex stepover x8 BIL, x8 w/ 5# DB cues for pacing and eccentric control    [facility] Adult PT Treatment:                                                DATE: 02/19/2024  Therapeutic Exercise:  Nustep level 2 x 5 mins  while planning session with patient Seated march 3 x 5 GTB Bridge 2x12 cues for breath control Hooklying clam green band 2 x 12 STS from high-low table 2 x 8   Neuromuscular re-ed: Bosu TKE push 2x8 BIL cues for quad/hamstring co contraction Lateral lunge with slider 2 x 8 at counter for UE support Retro lunge with slider 2 x 8 at counter for UE support     PATIENT EDUCATION:  Education details: rationale for interventions, HEP  Person educated: Patient Education method: Explanation, Demonstration, Tactile cues, Verbal cues Education comprehension: verbalized understanding, returned demonstration, verbal cues required, tactile cues required, and needs further education     HOME EXERCISE PROGRAM: Access Code: [email or web address] URL:  [email or web address] Date: 03/01/2024 Prepared by: Mayme Spearman  Exercises - Supine Bridge  - 2-3 x daily - 1 sets - 8 reps - Seated March  - 2-3 x daily - 1 sets - 8 reps - Standing Partial Lunge  - 2-3 x daily - 1 sets - 6 reps - Squat with Chair Touch  - 2-3 x weekly - 2 sets - 10 reps - Seated Hamstring Curl with Anchored Resistance  - 2-3 x weekly - 2 sets - 12 reps  ASSESSMENT:  CLINICAL IMPRESSION: 03/04/2024 Pt arrives w/ 5/10 pain - reports MRI has been ordered, would like to continue with PT as she notes some improvements since starting (see subjective). Today we focus on progression of strengthening focal musculature and in functional positioning. Tolerates progressions well with report of improving pain and energy levels as session goes on. No adverse events. Recommend continuing along current POC in order to address relevant deficits and improve functional tolerance. Pt departs today's session in no acute distress, all voiced questions/concerns addressed appropriately from PT perspective.      EVAL: Catalaya is a 48 y.o. female who was seen today for physical therapy evaluation and treatment for persistent low back pain and R LE pain.. She is demonstrating decreased LQ MMT scores, decreased R>L hamstring muscle length, decreased  LS AROM and decreased LS mobility with CPA assessment. She has related pain and difficulty with squatting, transfers, lower body dressing, and prolonged weight bearing activities as needed for household and occupational duties. She additionally has impaired sleep quality.   OBJECTIVE IMPAIRMENTS: decreased activity tolerance, decreased ROM, decreased strength, postural dysfunction, and pain.   ACTIVITY LIMITATIONS: lifting, bending, squatting, sleeping, stairs, and dressing  PARTICIPATION LIMITATIONS: cleaning, shopping, community activity, and occupation  PERSONAL FACTORS: Past/current experiences, Time since onset of  injury/illness/exacerbation, and 1-2 comorbidities: Relevant PMHx includes HIV, DVT of L LE, Obesity, Patellofemoral pain syndrome   are also affecting patient's functional outcome.   REHAB POTENTIAL: Fair    CLINICAL DECISION MAKING: Evolving/moderate complexity  EVALUATION COMPLEXITY: Moderate   GOALS: Goals reviewed with patient? Yes  SHORT TERM GOALS: Target date: 03/01/2024   Patient will be independent with initial home program at least 4x/week.  03/01/24: reports good adherence overall Goal status: MET  2.  Patient will demonstrate at least 4+/5 MMT with LE strength testing Baseline: see objective measures  Goal status: ONGOING     LONG TERM GOALS: Target date: 03/29/2024    Patient will report improved overall functional ability with LEFS score of 50/80 or greater.  Baseline: 18/80 Goal status: INITIAL  2.  Patient will demonstrate improved R hamstring flexibility by at least 15 degrees.  Baseline: see objective measures  Goal status: INITIAL  3.  Patient will report 5/10 pain at worst with with daily activities and work-related activities Baseline: 9/10 Goal status: INITIAL  4.  Patient will demonstrate ability to perform floor to waist lifting of at least 20# using appropriate body mechanics and with no more than minimal pain in order to safely perform normal daily/occupational tasks.  Goal status: INITIAL   PLAN:  PT FREQUENCY: 1-2x/week  PT DURATION: 8 weeks  PLANNED INTERVENTIONS: 97164- PT Re-evaluation, 97110-Therapeutic exercises, 97530- Therapeutic activity, [id number]- Neuromuscular re-education, 97535- Self Care, 10272- Manual therapy, [phone]- Aquatic Therapy, [id number]- Electrical stimulation (unattended), [phone]- Electrical stimulation (manual), [id number]- Traction (mechanical), Patient/Family education, Taping, Dry Needling, Joint mobilization, Joint manipulation, Spinal manipulation, Spinal mobilization, Cryotherapy, and Moist heat  PLAN FOR NEXT SESSION:   assess neck pain and associated objective measures as appropriate per pt preference; Lower quarter mobility and pain modulation via manual therapy, aerobic activities, AAROM/AROM, and modalities   Lovett Ruck PT, DPT 03/04/2024 9:59 AM

## 2024-03-04 ENCOUNTER — Ambulatory Visit: Payer: Self-pay | Admitting: Physical Therapy

## 2024-03-04 ENCOUNTER — Encounter: Payer: Self-pay | Admitting: Physical Therapy

## 2024-03-04 DIAGNOSIS — G8929 Other chronic pain: Secondary | ICD-10-CM

## 2024-03-04 DIAGNOSIS — M542 Cervicalgia: Secondary | ICD-10-CM

## 2024-03-04 DIAGNOSIS — M25551 Pain in right hip: Secondary | ICD-10-CM

## 2024-03-09 ENCOUNTER — Telehealth: Payer: Self-pay | Admitting: Licensed Clinical Social Worker

## 2024-03-09 NOTE — Telephone Encounter (Signed)
 H&V Care Navigation CSW Progress Note  Clinical Social Worker  mailed new orange card and cone financial assistance application in Spanish  to pt. She is currently not scheduled for any appts with Heart/Vascular at this time for follow up so will defer application assistance to financial counselor, Constantino Demark.  Patient is participating in a Managed Medicaid Plan:  No, self pay only  SDOH Screenings   Food Insecurity: No Food Insecurity (01/06/2024)  Housing: Unknown (01/06/2024)  Transportation Needs: No Transportation Needs (01/06/2024)  Utilities: Not At Risk (10/02/2023)  Alcohol Screen: Low Risk  (01/06/2024)  Depression (PHQ2-9): Medium Risk (02/02/2024)  Financial Resource Strain: Low Risk  (01/06/2024)  Physical Activity: Insufficiently Active (01/06/2024)  Social Connections: Moderately Isolated (01/06/2024)  Stress: Stress Concern Present (01/06/2024)  Tobacco Use: Low Risk  (03/04/2024)  Health Literacy: Adequate Health Literacy (10/02/2023)    Nathen Balder, MSW, LCSW Clinical Social Worker II [facility] Heart/Vascular Care Navigation  [phone]- work cell phone (preferred)

## 2024-03-10 ENCOUNTER — Encounter (INDEPENDENT_AMBULATORY_CARE_PROVIDER_SITE_OTHER): Payer: Self-pay | Admitting: Internal Medicine

## 2024-03-10 ENCOUNTER — Ambulatory Visit (INDEPENDENT_AMBULATORY_CARE_PROVIDER_SITE_OTHER): Payer: Self-pay | Admitting: Internal Medicine

## 2024-03-10 VITALS — BP 135/77 | HR 78 | Temp 98.0°F | Ht 62.0 in | Wt 258.0 lb

## 2024-03-10 DIAGNOSIS — M533 Sacrococcygeal disorders, not elsewhere classified: Secondary | ICD-10-CM | POA: Insufficient documentation

## 2024-03-10 DIAGNOSIS — B20 Human immunodeficiency virus [HIV] disease: Secondary | ICD-10-CM

## 2024-03-10 DIAGNOSIS — K8020 Calculus of gallbladder without cholecystitis without obstruction: Secondary | ICD-10-CM

## 2024-03-10 DIAGNOSIS — K760 Fatty (change of) liver, not elsewhere classified: Secondary | ICD-10-CM

## 2024-03-10 DIAGNOSIS — E785 Hyperlipidemia, unspecified: Secondary | ICD-10-CM

## 2024-03-10 DIAGNOSIS — E669 Obesity, unspecified: Secondary | ICD-10-CM

## 2024-03-10 DIAGNOSIS — R7303 Prediabetes: Secondary | ICD-10-CM | POA: Insufficient documentation

## 2024-03-10 DIAGNOSIS — Z6841 Body Mass Index (BMI) 40.0 and over, adult: Secondary | ICD-10-CM

## 2024-03-10 NOTE — Progress Notes (Addendum)
 Office: [phone]  /  Fax: [phone]   Initial Visit  Kathleen Savage was seen in clinic today to evaluate for obesity. She is interested in losing weight to improve overall health and reduce the risk of weight related complications. She presents today to review program treatment options, initial physical assessment, and evaluation.     Discussed the use of AI scribe software for clinical note transcription with the patient, who gave verbal consent to proceed.  History of Present Illness   Kathleen Savage "[name]" is a 48 year old female with obesity and prediabetes who presents for medical weight management.  She has struggled with weight issues since childhood, with her weight problems beginning before the age of [age]. Her maximum weight has been 260 pounds, and she is currently near this weight. Her body fat percentage is 52%, and her visceral fat level is 18, both above recommended levels. She experiences cravings rather than constant hunger, attributing this to anxiety and stress. Her sleep is often disrupted by pain, and she reports a decrease in physical activity due to discomfort. She used to exercise occasionally but now finds herself sedentary after work.  She has a history of blood clots and elevated cholesterol levels, although she has not been formally diagnosed with hypercholesterolemia. Recent laboratory results indicated elevated blood sugar levels, suggesting prediabetes, though she was not previously informed of this condition.  She is currently on antiretroviral therapy for HIV, specifically Biktarvy , and reports no side effects from this medication. She was off her medication for about a month due to insurance issues, which led to an increase in her viral load, but she has since resumed her treatment.  She experiences significant joint pain, particularly in the right pelvic area, which radiates to her knee. She has been treated with steroids and physical therapy  without relief. She previously used diclofenac  for pain management but discontinued it due to concerns about side effects and dependency. She is now using meloxicam  for pain relief. An MRI is scheduled to further investigate the cause of her pain.  Her family history includes a mother with overweight issues and a father who suffered from thrombosis. No family history of diabetes. She reports a high level of stress related to work and other factors. 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         She was referred by: Self-Referral  When asked what else they would like to accomplish? She states: Improve energy levels and physical activity, Improve existing medical conditions, Reduce number of medications, Improve quality of life, and Improve self-confidence  When asked how has your weight affected you? She states: Contributed to medical problems, Contributed to orthopedic problems or mobility issues, Having fatigue, and Having poor endurance  Weight history: See above  Some associated conditions: Hyperlipidemia, MASLD, Prediabetes, and Venous thromboembolism  Contributing factors: Use of obesogenic medications: Other: HAART, gabpentin, Reduced physical activity, and Strong orexigenic signaling and inadequate inhibitory control   Weight promoting medications identified: Antiepileptics and Other: HAART  Current nutrition plan: None  Current level of physical activity: Limited due to chronic pain or orthopedic problems and Low levels of physical activity at present  Current or previous pharmacotherapy: None  Response to medication: Never tried medications   Past medical history includes:   Past Medical History:  Diagnosis Date   Glucose intolerance (pre-diabetes)    HIV infection (HCC) 2005   Hyperlipidemia  Objective:   BP 135/77   Pulse 78   Temp 98 F (36.7 C)   Ht 5\' 2"  (1.575 m)    Wt 258 lb (117 kg)   LMP 03/10/2024 (Approximate)   SpO2 97%   BMI 47.19 kg/m  She was weighed on the bioimpedance scale: Body mass index is 47.19 kg/m.  Peak Weight:260 , Body Fat%:52.8, Visceral Fat Rating:18, Weight trend over the last 12 months: Decreasing  General:  Alert, oriented and cooperative. Patient is in no acute distress.  Respiratory: Normal respiratory effort, no problems with respiration noted   Gait: able to ambulate independently  Mental Status: Normal mood and affect. Normal behavior. Normal judgment and thought content.   DIAGNOSTIC DATA REVIEWED:  BMET    Component Value Date/Time   NA 138 12/17/2023 0909   NA 139 10/02/2023 1035   K 4.2 12/17/2023 0909   CL 107 12/17/2023 0909   CO2 27 12/17/2023 0909   GLUCOSE 122 (H) 12/17/2023 0909   BUN 11 12/17/2023 0909   BUN 12 10/02/2023 1035   CREATININE 0.67 12/17/2023 0909   CREATININE 0.59 06/16/2023 0851   CALCIUM 8.3 (L) 12/17/2023 0909   GFRNONAA >60 12/17/2023 0909   GFRNONAA >89 11/15/2013 0927   GFRAA >60 07/19/2020 1406   GFRAA >89 11/15/2013 0927   Lab Results  Component Value Date   HGBA1C 5.9 10/02/2023   HGBA1C 5.4 01/25/2013   No results found for: "INSULIN" CBC    Component Value Date/Time   WBC 5.4 12/17/2023 0909   WBC 6.6 06/16/2023 0851   RBC 4.11 12/17/2023 0909   HGB 12.0 12/17/2023 0909   HGB 12.0 10/02/2023 1035   HCT 36.3 12/17/2023 0909   HCT 37.0 10/02/2023 1035   PLT 334 12/17/2023 0909   PLT 374 10/02/2023 1035   MCV 88.3 12/17/2023 0909   MCV 89 10/02/2023 1035   MCH 29.2 12/17/2023 0909   MCHC 33.1 12/17/2023 0909   RDW 13.2 12/17/2023 0909   RDW 13.3 10/02/2023 1035   Iron/TIBC/Ferritin/ %Sat No results found for: "IRON", "TIBC", "FERRITIN", "IRONPCTSAT" Lipid Panel     Component Value Date/Time   CHOL 164 01/12/2024 1135   CHOL 221 (H) 12/29/2022 1021   TRIG 133 01/12/2024 1135   HDL 42 (L) 01/12/2024 1135   HDL 56 12/29/2022 1021   CHOLHDL 3.9  01/12/2024 1135   VLDL 57 (H) 01/06/2017 1042   LDLCALC 99 01/12/2024 1135   Hepatic Function Panel     Component Value Date/Time   PROT 7.2 12/17/2023 0909   PROT 8.1 12/29/2022 1021   ALBUMIN 3.8 12/17/2023 0909   ALBUMIN 4.7 12/29/2022 1021   AST 20 12/17/2023 0909   ALT 27 12/17/2023 0909   ALKPHOS 46 12/17/2023 0909   BILITOT 0.4 12/17/2023 0909   No results found for: "TSH"   Assessment and Plan:   Prediabetes  Human immunodeficiency virus (HIV) disease (HCC)  Dyslipidemia  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 Currently resumed Biktarvy  with no reported side effects. Discussed potential impact of Biktarvy  on weight and blood sugar levels. - Continue Biktarvy  as prescribed - Monitor viral load and CD4 count regularly  Sacroiliitis Sacroiliitis with inflammation on CT scan, causing pain in the right pelvic area radiating to the  knee. Previous treatments including steroids and physical therapy have been ineffective. Scheduled for an MRI to further evaluate the condition. Discussed potential risks of long-term NSAID use, including kidney damage and ulcers. - Proceed with scheduled MRI to assess sacroiliitis - Continue current pain management strategies - Avoid long-term use of NSAIDs like diclofenac   Prediabetes Elevated blood sugar levels indicating prediabetes. No prior diagnosis of diabetes or family history of diabetes. Discussed the impact of weight and medication on blood sugar levels. - Implement weight management strategies to reduce blood sugar levels - Monitor blood sugar levels regularly  Obesity Long-standing obesity with a body fat percentage of 52% and visceral fat level of 18. Discussed genetic, environmental, and lifestyle  factors contributing to obesity. Emphasized the importance of weight loss for improving prediabetes, fatty liver, and joint pain. Explained the role of diet, exercise, and potential use of medications in weight management. Highlighted the need for a 10-15% weight reduction to improve health conditions.  We reviewed anthropometrics, biometrics, associated medical conditions and contributing factors with patient. she would benefit from a medically tailored reduced calorie nutrional plan based on her REE (resting energy expenditure), which will be determined by indirect calorimetry.  We will also assess for cardiometabolic risk and nutritional derangements via fasting labs at intake appointment.   - Enroll in the weight management program after securing insurance coverage - Schedule regular follow-up visits every 2-3 weeks initially - Implement dietary and exercise modifications - Consider pharmacotherapy for weight loss if indicated  Fatty liver Fatty liver identified on CT scan of the abdomen pelvis and chest from 2025. Emphasized the importance of weight loss in managing fatty liver. - Implement weight management strategies to reduce liver fat  Cholelithiasis Gallstones identified on CT scan. Currently asymptomatic with no indication for surgery. Advised on potential symptoms to monitor for, such as abdominal pain and vomiting. - Monitor for symptoms of gallstone complications       Patient encouraged to enrolled and charity care as coming in to a high intensity weight management program may be cost prohibitive.  We may have to make some modifications to the frequency of her visits at the risk of losing some of the programs effectiveness.      Obesity Treatment / Action Plan:  Patient will work on garnering support from family and friends to begin weight loss journey. Will work on eliminating or reducing the presence of highly palatable, calorie dense foods in the home. Will complete  provided nutritional and psychosocial assessment questionnaire before the next appointment. Will be scheduled for indirect calorimetry to determine resting energy expenditure in a fasting state.  This will allow us  to create a reduced calorie, high-protein meal plan to promote loss of fat mass while preserving muscle mass. Counseled on the health benefits of losing 5%-15% of total body weight. Was counseled on nutritional approaches to weight loss and benefits of reducing processed foods and consuming plant-based foods and high quality protein as part of nutritional weight management. Was counseled on pharmacotherapy and role as an adjunct in weight management.   Obesity Education Performed Today:  She was weighed on the bioimpedance scale and results were discussed and documented in the synopsis.  We discussed obesity as a disease and the importance of a more detailed evaluation of all the factors contributing to the disease.  We discussed the importance of long term lifestyle changes which include nutrition, exercise and behavioral modifications as well as the importance of customizing this to her specific health and  social needs.  We discussed the benefits of reaching a healthier weight to alleviate the symptoms of existing conditions and reduce the risks of the biomechanical, metabolic and psychological effects of obesity.  Dareen Savage appears to be in the action stage of change and states they are ready to start intensive lifestyle modifications and behavioral modifications.  I have spent 46 minutes in the care of the patient today including: 4 minutes before the visit reviewing and prepping the chart 35 minutes face-to-face assessing and reviewing listed medical problems as outlined in obesity care plan, providing nutritional and behavioral counseling on topics outlined in the obesity care plan, independently interpreting test results and goals of care, as described in assessment and  plan, reviewing and discussing biometric information and progress, and reviewing notes and diagnostics 7 minutes after the visit updating chart and documentation    Reviewed by clinician on day of visit: allergies, medications, problem list, medical history, surgical history, family history, social history, and previous encounter notes pertinent to obesity diagnosis.   Ladd Picker, MD

## 2024-03-17 ENCOUNTER — Ambulatory Visit
Admission: RE | Admit: 2024-03-17 | Discharge: 2024-03-17 | Disposition: A | Discharge status: Home / Self Care | Payer: Self-pay | Source: Ambulatory Visit | Attending: Sports Medicine | Admitting: Sports Medicine

## 2024-03-17 ENCOUNTER — Telehealth: Payer: Self-pay

## 2024-03-17 DIAGNOSIS — G8929 Other chronic pain: Secondary | ICD-10-CM

## 2024-03-17 DIAGNOSIS — M25551 Pain in right hip: Secondary | ICD-10-CM

## 2024-03-17 NOTE — Telephone Encounter (Signed)
 Detectable Viral Load Intervention (DVL)  Most recent VL:  HIV 1 RNA Quant  Date Value Ref Range Status  01/12/2024 1,100,000 (H) copies/mL Final  06/16/2023 Not Detected Copies/mL Final  06/11/2022 <20 (H) Copies/mL Final    Comment:    HIV-1 RNA Detected    Last Clinic Visit: 01/12/24  Current ART regimen: Biktarvy   Appointment status: patient does not have future appointment scheduled   Medication last dispensed (per chart review):   Medication Adherence   What pharmacy do you use for your ART? [facility]   Do you pick up your medication at the pharmacy or is it mailed to you? pick up at pharmacy  How often do you miss a dose your ART? never or almost never  Are you experiencing any side effects with your ART? No   Are you having any trouble remembering what medication(s) you are supposed to take or how you are supposed to take them? No   What helps you remember to take your medication(s)? No-- typically takes it at night before going to sleep.    Barriers to Care   Lack of transportation to medical appointments? No   2. Housing instability? no  3. If you are currently employed, are you having difficulty taking time off of work for medical appointments? no  4. Financial concerns (rent, utilities, etc.) no  5. Lack of consistent access to food? no  6. Trouble remembering and attending your appointments? no  7. Are you experiencing any other barriers that make it hard for you to come to appointments or take medication regularly? no   Interventions   Called patient to discuss medication adherence and possible barriers to care.   Julien Odor, RMA

## 2024-03-21 ENCOUNTER — Ambulatory Visit (INDEPENDENT_AMBULATORY_CARE_PROVIDER_SITE_OTHER): Payer: Self-pay | Admitting: Infectious Diseases

## 2024-03-21 ENCOUNTER — Encounter: Payer: Self-pay | Admitting: Infectious Diseases

## 2024-03-21 ENCOUNTER — Other Ambulatory Visit: Payer: Self-pay

## 2024-03-21 VITALS — BP 150/87 | HR 79 | Temp 98.4°F | Ht 62.0 in | Wt 259.0 lb

## 2024-03-21 DIAGNOSIS — M25562 Pain in left knee: Secondary | ICD-10-CM

## 2024-03-21 DIAGNOSIS — M25561 Pain in right knee: Secondary | ICD-10-CM

## 2024-03-21 DIAGNOSIS — M25552 Pain in left hip: Secondary | ICD-10-CM

## 2024-03-21 DIAGNOSIS — B20 Human immunodeficiency virus [HIV] disease: Secondary | ICD-10-CM | POA: Insufficient documentation

## 2024-03-21 DIAGNOSIS — G8929 Other chronic pain: Secondary | ICD-10-CM

## 2024-03-21 DIAGNOSIS — Z0000 Encounter for general adult medical examination without abnormal findings: Secondary | ICD-10-CM

## 2024-03-21 DIAGNOSIS — Z6841 Body Mass Index (BMI) 40.0 and over, adult: Secondary | ICD-10-CM

## 2024-03-21 DIAGNOSIS — M25551 Pain in right hip: Secondary | ICD-10-CM

## 2024-03-21 DIAGNOSIS — Z5181 Encounter for therapeutic drug level monitoring: Secondary | ICD-10-CM

## 2024-03-21 MED ORDER — ATORVASTATIN CALCIUM 10 MG PO TABS: 10.0000 mg | ORAL_TABLET | Freq: Every day | ORAL | 5 refills | Status: DC

## 2024-03-21 NOTE — Progress Notes (Unsigned)
    Ben Jackson D.[facility] [address] [place] [address] Phone: [phone]   Assessment and Plan:     There are no diagnoses linked to this encounter.  ***   Pertinent previous records reviewed include ***    Follow Up: ***     Subjective:   I, Arrionna Serena, am serving as a Neurosurgeon for Doctor Ulysees Gander   Chief Complaint: bilat leg  and back pain    HPI:    12/29/2023 Patient is a 48 year old female with bilat leg and bakl  pain. Patient states pain for about 2 years. No MOI. Pain starts at the neck and goes down her low back to her hip , groin and then the knee. Tylenol  with codein for the pain . Didn't help with the pain made her sleepy. No numbness and tingling. Just pain. She has a hard time standing up straight, antalgic gait. Constant pain. 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 She is currently moving . 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   Current Outpatient Medications:    acetaminophen -codeine  (TYLENOL  #3) 300-30 MG tablet, Take 1-2 tablets by mouth every 8 (eight) hours as needed for moderate pain (pain score 4-6)., Disp: 60 tablet, Rfl: 0   bictegravir-emtricitabine-tenofovir  AF (BIKTARVY ) 50-200-25 MG TABS tablet, Take 1 tablet by mouth daily., Disp: 30 tablet, Rfl: 11   DICLOFENAC  PO, Take 100 mg by mouth daily., Disp: , Rfl:    meloxicam  (MOBIC ) 15 MG tablet, Take 1 tablet (15 mg total) by mouth daily., Disp: 30 tablet, Rfl: 0   Objective:     There were no vitals filed for this visit.    There is no height or weight on file to  calculate BMI.    Physical Exam:    ***   Electronically signed by:  Marshall Skeeter D.[facility] 7:36 AM 03/21/24

## 2024-03-21 NOTE — Progress Notes (Signed)
 [address], [place], [place], [address]                                                                  Phn. [phone]; Fax: [phone]                                                                             Date: 03/21/24  Reason for Visit: HIV fu  HPI: Kathleen Savage is a 48 y.o.old female with a history of HIV,  prediabetes and HLD, previously seen by Dr Daina Drum for a long time who is here for fu.   Interval hx/current visit:  Reports compliance to Biktarvy  without any missed doses since her last visit. She is following her PCP regarding management of her prediabetes and obesity  through diet and exercise. She reports being referred to the wellness center but has not yet. She reports recently had MRI of her spine done for back pain and waiting for results to be discussed by the ordering provider.  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 Back pain goes down from her hips to legs. Discussed about lipid panel from last visit and benefit from statin to which she agreed. No other complaints.   ROS: As stated in above HPI; all other systems were reviewed and are otherwise negative unless noted below  No reported fever / chills, night sweats, unintentional weight loss, acute visual change, odynophagia, chest pain/pressure, new or worsened SOB or WOB, nausea, vomiting, diarrhea, dysuria, GU discharge, syncope, seizures, red/hot swollen joints, hallucinations / delusions, rashes, new allergies, unusual / excessive bleeding, swollen lymph nodes  PMH/ PSH/ FamHx / Social Hx , medications and allergies reviewed and updated as appropriate; please see corresponding tab in EHR / prior notes  Current Outpatient Medications on File Prior to Visit   Medication Sig Dispense Refill   acetaminophen -codeine  (TYLENOL  #3) 300-30 MG tablet Take 1-2 tablets by mouth every 8 (eight) hours as needed for moderate pain (pain score 4-6). 60 tablet 0   bictegravir-emtricitabine-tenofovir  AF (BIKTARVY ) 50-200-25 MG TABS tablet Take 1 tablet by mouth daily. 30 tablet 11   DICLOFENAC  PO Take 100 mg by mouth daily.     meloxicam  (MOBIC ) 15 MG tablet Take 1 tablet (15 mg total) by mouth daily. 30 tablet 0   No current facility-administered medications on file prior to visit.   No Known Allergies  Past Medical History:  Diagnosis Date   Glucose intolerance (pre-diabetes)    HIV infection (HCC) 2005   Hyperlipidemia    Past Surgical History:  Procedure Laterality Date  LOWER EXTREMITY VENOGRAPHY Left 05/12/2023   Procedure: LOWER EXTREMITY VENOGRAPHY;  Surgeon: Margherita Shell, MD;  [facility];  Service: Cardiovascular;  Laterality: Left;   PERIPHERAL VASCULAR BALLOON ANGIOPLASTY  05/12/2023   Procedure: PERIPHERAL VASCULAR BALLOON ANGIOPLASTY;  Surgeon: Margherita Shell, MD;  Location: [facility];  Service: Cardiovascular;;   PERIPHERAL VASCULAR THROMBECTOMY N/A 05/12/2023   Procedure: MECHANICAL THROMBECTOMY;  Surgeon: Margherita Shell, MD;  [facility]: [facility];  Service: Cardiovascular;  Laterality: N/A;    Social History   Socioeconomic History   Marital status: Married    Spouse name: Not on file   Number of children: 2   Years of education: Not on file   Highest education level: 6th grade  Occupational History   Not on file  Tobacco Use   Smoking status: Never   Smokeless tobacco: Never  Vaping Use   Vaping status: Never Used  Substance and Sexual Activity   Alcohol use: Yes    Alcohol/week: 3.0 standard drinks of alcohol    Types: 1 Glasses of wine, 1 Cans of beer, 1 Shots of liquor per week    Comment: occasional   Drug use: No   Sexual activity: Yes    Partners: Male    Birth  control/protection: Condom    Comment: given condoms  Other Topics Concern   Not on file  Social History Narrative   Not on file   Social Drivers of Health   Financial Resource Strain: Low Risk  (01/06/2024)   Overall Financial Resource Strain (CARDIA)    Difficulty of Paying Living Expenses: Not very hard  Food Insecurity: No Food Insecurity (01/06/2024)   Hunger Vital Sign    Worried About Running Out of Food in the Last Year: Never true    Ran Out of Food in the Last Year: Never true  Transportation Needs: No Transportation Needs (01/06/2024)   PRAPARE - Administrator, Civil Service (Medical): No    Lack of Transportation (Non-Medical): No  Physical Activity: Insufficiently Active (01/06/2024)   Exercise Vital Sign    Days of Exercise per Week: 3 days    Minutes of Exercise per Session: 30 min  Stress: Stress Concern Present (01/06/2024)   [employer] of Occupational Health - Occupational Stress Questionnaire    Feeling of Stress : Rather much  Social Connections: Moderately Isolated (01/06/2024)   Social Connection and Isolation Panel [NHANES]    Frequency of Communication with Friends and Family: Three times a week    Frequency of Social Gatherings with Friends and Family: Once a week    Attends Religious Services: Never    Database administrator or Organizations: No    Attends Banker Meetings: Never    Marital Status: Married  Catering manager Violence: Not At Risk (10/02/2023)   Humiliation, Afraid, Rape, and Kick questionnaire    Fear of Current or Ex-Partner: No    Emotionally Abused: No    Physically Abused: No    Sexually Abused: No   Family History  Problem Relation Age of Onset   Cancer Maternal Aunt        Breast Cancer   Breast cancer Maternal Aunt    Cancer Cousin        Breast cancer - maternal cousin   Breast cancer Cousin    Cancer Cousin        breast cancer   Breast cancer Cousin    Cancer  Cousin        breast cancer    Cancer Maternal Aunt        stomach cancer   Breast cancer Maternal Aunt    Hypertension Mother    Stroke Father    Cancer Sister    Uterine cancer Sister     Vitals  BP (!) 150/87   Pulse 79   Temp 98.4 F (36.9 C) (Oral)   Ht 5\' 2"  (1.575 m)   Wt 259 lb (117.5 kg)   LMP 03/10/2024 (Approximate)   SpO2 96%   BMI 47.37 kg/m   Examination  Gen: no acute distress, morbidly obese  HEENT: [place]/AT, no scleral icterus, no pale conjunctivae, hearing normal, oral mucosa moist Neck: Supple Cardio: Regular rate and rhythm Resp: Pulmonary effort normal in room air GI: nondistended GU: Musc: Extremities: No pedal edema Skin: No rashes Neuro: grossly non focal , awake, alert and oriented * 3  Psych: Calm, cooperative  Lab Results HIV 1 RNA Quant  Date Value  01/12/2024 1,100,000 copies/mL (H)  06/16/2023 Not Detected Copies/mL  06/11/2022 <20 Copies/mL (H)   HIV-1 RNA Viral Load (copies/mL)  Date Value  12/23/2021 <20   CD4 T Cell Abs (/uL)  Date Value  01/12/2024 322 (L)  06/16/2023 877  06/11/2022 1,077   No results found for: "HIV1GENOSEQ" Lab Results  Component Value Date   WBC 5.4 12/17/2023   HGB 12.0 12/17/2023   HCT 36.3 12/17/2023   MCV 88.3 12/17/2023   PLT 334 12/17/2023    Lab Results  Component Value Date   CREATININE 0.67 12/17/2023   BUN 11 12/17/2023   NA 138 12/17/2023   K 4.2 12/17/2023   CL 107 12/17/2023   CO2 27 12/17/2023   Lab Results  Component Value Date   ALT 27 12/17/2023   AST 20 12/17/2023   ALKPHOS 46 12/17/2023   BILITOT 0.4 12/17/2023    Lab Results  Component Value Date   CHOL 164 01/12/2024   TRIG 133 01/12/2024   HDL 42 (L) 01/12/2024   LDLCALC 99 01/12/2024   No results found for: "HAV" Lab Results  Component Value Date   HEPBSAG NEGATIVE 05/30/2014   HEPBSAB NEG 05/30/2014   Lab Results  Component Value Date   HCVAB NEGATIVE 05/30/2014   Lab Results  Component Value Date   CHLAMYDIAWP Negative  02/02/2024   N Negative 02/02/2024   No results found for: "GCPROBEAPT" No results found for: "QUANTGOLD"   Health Maintenance: Immunization History  Administered Date(s) Administered   H1N1 11/28/2008   Hepatitis B 12/04/2003, 07/02/2004, 11/27/2004   Hepatitis B, ADULT 06/13/2014, 01/23/2015   Influenza Split 08/04/2011, 07/27/2012   Influenza Whole 09/08/2006, 09/14/2007, 08/03/2008, 10/23/2009, 09/10/2010   Influenza, Seasonal, Injecte, Preservative Fre 01/12/2024   Influenza,inj,Quad PF,6+ Mos 11/29/2013, 01/23/2015, 08/09/2015, 07/22/2016, 07/28/2017, 09/14/2018, 12/23/2021, 12/29/2022   Meningococcal Mcv4o 01/27/2017, 09/14/2018   PNEUMOCOCCAL CONJUGATE-20 01/12/2024   Pneumococcal Conjugate-13 09/14/2018   Pneumococcal Polysaccharide-23 10/10/2003, 11/28/2008   Tdap 02/12/2022    Assessment/Plan: # HIV - continue Biktarvy   - labs today  - fu in 6 weeks   # STD screening  - recently screened  # Hyperlipidemia/Morbid Obesity/Prediabetes   - discussed lipid panel from last visit and healthy life style measures - Start atorvastatin 10 mg po daily # 30 with 5 refills  # Chronci LBP, b/l knee and hip pian - MRI L spine no signs of infection  - Fu with Sports medicine   # Immunization  COVID  Influenza 01/12/24 Monkeypox Pneumococcal PCV 13 09/2018, Pneumococcal PPV 09/2003 and  PCV 2 01/12/24 Meningococcal Menveo 01/2017 and 09/2018 HepA check HAV ab  today Hep B  06/13/2014 and 01/2015,check Hep B surface ab today Tdap 02/12/2022 Zoster  #Health maintenance Hep C ab negative 2015 Hep B surface ag negative 05/2014 02/02/24 Papsmear negative  01/28/24 mammogram negative, next due in a year Colon ca screening - has been discussed, per PCP Dental Care has been discussed   Patient's labs were reviewed as well as his previous records. Patients questions were addressed and answered. Safe sex counseling done.  I have personally spent 35  minutes involved in face-to-face  and non-face-to-face activities for this patient on the day of the visit. Professional time spent includes the following activities: Preparing to see the patient (review of tests), Obtaining and/or reviewing separately obtained history (admission/discharge record), Performing a medically appropriate examination and/or evaluation , Ordering medications/tests/procedures, referring and communicating with other health care professionals, Documenting clinical information in the EMR, Independently interpreting results (not separately reported), Communicating results to the patient/family/caregiver, Counseling and educating the patient/family/caregiver and Care coordination (not separately reported).    Electronically signed by:  Terre Ferri, MD Infectious Disease Physician [facility] [address] [place], [place] [address] Phone: [phone]  Fax: [phone]

## 2024-03-22 ENCOUNTER — Ambulatory Visit: Payer: Self-pay | Attending: Sports Medicine

## 2024-03-22 ENCOUNTER — Ambulatory Visit (INDEPENDENT_AMBULATORY_CARE_PROVIDER_SITE_OTHER): Payer: Self-pay | Admitting: Sports Medicine

## 2024-03-22 VITALS — BP 120/70 | HR 94 | Ht 62.0 in | Wt 261.4 lb

## 2024-03-22 DIAGNOSIS — M5442 Lumbago with sciatica, left side: Secondary | ICD-10-CM | POA: Insufficient documentation

## 2024-03-22 DIAGNOSIS — M51362 Other intervertebral disc degeneration, lumbar region with discogenic back pain and lower extremity pain: Secondary | ICD-10-CM

## 2024-03-22 DIAGNOSIS — M542 Cervicalgia: Secondary | ICD-10-CM | POA: Insufficient documentation

## 2024-03-22 DIAGNOSIS — M25551 Pain in right hip: Secondary | ICD-10-CM | POA: Insufficient documentation

## 2024-03-22 DIAGNOSIS — M5441 Lumbago with sciatica, right side: Secondary | ICD-10-CM | POA: Insufficient documentation

## 2024-03-22 DIAGNOSIS — G8929 Other chronic pain: Secondary | ICD-10-CM | POA: Insufficient documentation

## 2024-03-22 DIAGNOSIS — M25552 Pain in left hip: Secondary | ICD-10-CM | POA: Insufficient documentation

## 2024-03-22 LAB — T-HELPER CELLS (CD4) COUNT (NOT AT ARMC)
CD4 % Helper T Cell: 43 % (ref 33–65)
CD4 T Cell Abs: 835 /uL (ref 400–1790)

## 2024-03-22 NOTE — Patient Instructions (Signed)
 Epidural right L5-S1 Follow up 2 weeks after

## 2024-03-22 NOTE — Therapy (Signed)
 OUTPATIENT PHYSICAL THERAPY TREATMENT NOTE   Patient Name: Neily Gabler MRN: [id number] DOB:1976-11-09, 48 y.o., female Today's Date: 03/22/2024  END OF SESSION:  PT End of Session - 03/22/24 1140     Visit Number 7    Number of Visits 16    Date for PT Re-Evaluation 03/29/24    Authorization Type CAFA    PT Start Time 1136    PT Stop Time 1209    PT Time Calculation (min) 33 min    Activity Tolerance Patient tolerated treatment well    Behavior During Therapy WFL for tasks assessed/performed                   Past Medical History:  Diagnosis Date   Glucose intolerance (pre-diabetes)    HIV infection (HCC) 2005   Hyperlipidemia    Past Surgical History:  Procedure Laterality Date   LOWER EXTREMITY VENOGRAPHY Left 05/12/2023   Procedure: LOWER EXTREMITY VENOGRAPHY;  Surgeon: Margherita Shell, MD;  [facility];  Service: Cardiovascular;  Laterality: Left;   PERIPHERAL VASCULAR BALLOON ANGIOPLASTY  05/12/2023   Procedure: PERIPHERAL VASCULAR BALLOON ANGIOPLASTY;  Surgeon: Margherita Shell, MD;  [facility];  Service: Cardiovascular;;   PERIPHERAL VASCULAR THROMBECTOMY N/A 05/12/2023   Procedure: MECHANICAL THROMBECTOMY;  Surgeon: Margherita Shell, MD;  [facility];  Service: Cardiovascular;  Laterality: N/A;   Patient Active Problem List   Diagnosis Date Noted   HIV disease (HCC) 03/21/2024   Prediabetes 03/10/2024   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 03/10/2024   Metabolic dysfunction-associated steatotic liver disease (MASLD) 03/10/2024   Encounter for immunization 01/12/2024   Screening examination for venereal disease 07/01/2023   Medication monitoring encounter 07/01/2023   Health care maintenance 06/30/2023   Acute deep vein thrombosis (DVT) of iliac vein of left lower extremity (HCC) 05/08/2023   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   Patellofemoral pain syndrome 08/01/2020   Bell's palsy  02/09/2018   Elevated liver enzymes 11/29/2013   Dyslipidemia 01/25/2013   Hyperglycemia 01/25/2013   Multiple lipomas 01/25/2013   Morbid obesity (HCC) 05/07/2010   LOW BACK PAIN, CHRONIC 11/28/2008   Alopecia 08/03/2008   Herpes zoster 12/01/2006   PAP SMEAR, ABNORMAL 12/01/2006   Human immunodeficiency virus (HIV) disease (HCC) 09/14/2006    PCP: Collins Dean, NP  REFERRING PROVIDER: Ulysees Gander, DO  REFERRING DIAG: Chronic bilateral low back pain with bilateral sciatica [M54.42, M54.41, G89.29], Neck pain [M54.2], Right leg pain [M79.604], Bilateral hip pain [M25.551, M25.552]   THERAPY DIAG:  Chronic bilateral low back pain with bilateral sciatica  Rationale for Evaluation and Treatment: Rehabilitation  ONSET DATE: 2+ years  SUBJECTIVE:   SUBJECTIVE STATEMENT: 03/22/2024 Pt reports that she is feeling better, except for when she is at work. She has f/u with Dr. Cleora Daft later today to review recent MRN.    EVAL: Patient reports to PT with primary concern of right-sided low back and right leg pain with unknown onset. She feels that she has the most difficulty with bending her knees and lower body dressing. She sometimes wakes up at night d/t pain especially with laying on her side or back.   PERTINENT HISTORY: Relevant PMHx includes HIV, DVT of L LE, Obesity, Patellofemoral pain syndrome   PAIN:  Are you having pain?  Yes: NPRS scale: 5/10 current, 8/10 worst   Pain location: right-sided low back, BIL hip/groin, right knee Pain description: tight, burning, aching  Aggravating factors: bending, overhead reaching, working   Relieving factors: medicine as needed  PRECAUTIONS: None  RED FLAGS: None   WEIGHT BEARING RESTRICTIONS: No  FALLS:  Has patient fallen in last 6 months? No  LIVING ENVIRONMENT: Lives with: lives with their family and lives with their spouse Lives in: House/apartment Stairs: No  OCCUPATION: works at [employer]   PLOF:  Independent  PATIENT GOALS: To have less pain with daily activity  NEXT MD VISIT: 03/01/24  OBJECTIVE:  Note: Objective measures were completed at Evaluation unless otherwise noted.  DIAGNOSTIC FINDINGS:   04/04/2023 Lumbar Spine X-Ray   FINDINGS: No fracture, dislocation or subluxation. No spondylolisthesis. No osteolytic or osteoblastic changes.   Degenerative disc disease noted with disc space narrowing and marginal osteophytes at each lumbar level.   IMPRESSION: Multilevel degenerative changes. No acute osseous abnormalities.   PATIENT SURVEYS:  LEFS 18/80 = 32% perceived functional ability   COGNITION: Overall cognitive status: Within functional limits for tasks assessed     MUSCLE LENGTH: Hamstrings (SLR): Right 35 deg; Left 50 deg  POSTURE: rounded shoulders and forward head  PALPATION: Increased muscle turgor throughout BIL thoracolumbar paraspinal mm; moderate pain and tenderness throughout L1-L5 CPA, with decreased spinal mobility from L3-S1.   LUMBAR ROM:   Active  A/PROM  eval  Flexion 60%  Extension 50%  Right lateral flexion 60%  Left lateral flexion 50%  Right rotation WFL  Left rotation WFL   (Blank rows = not tested)   % of full AROM    LOWER EXTREMITY ROM:  Active ROM Right eval Left eval  Hip flexion    Hip extension    Hip abduction    Hip adduction    Hip internal rotation    Hip external rotation    Knee flexion    Knee extension    Ankle dorsiflexion    Ankle plantarflexion    Ankle inversion    Ankle eversion     (Blank rows = not tested)  LOWER EXTREMITY MMT:  MMT Right eval Left eval  Hip flexion 4+ 4+  Hip extension    Hip abduction 4+ 4+  Hip adduction 4 4  Hip internal rotation 4- 4  Hip external rotation 4 4+  Knee flexion 5 5  Knee extension 5 5  Ankle dorsiflexion 5 5  Ankle plantarflexion    Ankle inversion    Ankle eversion     (Blank rows = not tested)                                                                                                                                  TREATMENT DATE:   [facility] Adult PT Treatment:  DATE: 03/22/2024  Treadmill Walking x 5 minutes at self-selected pace (1.53mph)  Standing pull downs, standing on airex pad, Blue  Green band seated hamstring curl 2x10 BIL Green band standing hip 45 deg kickback with slider, 2x15 BIL cues for posture/form Green band standing hip abduction with slider, 2 x 15 Standing heel-toe raises x 20 each  Therapeutic Activity: STS 2x15 from lowest mat, with airex pad  5lb suitcase carry 3x51ft BIL cues for posture/pacing 7lb suitcase carry 3x12ft BIL cues for posture/pacing Side Stepping 2x29ft 2lb ankle weight   [facility] Adult PT Treatment:                                                DATE: 03/04/24 Therapeutic Exercise: Paced walking for warm up 5x113ft w/ break  Blue band hamstring curl 2x8 BIL Green band hip 45 deg kickback with slider, 2x8 BIL cues for posture/form Trial LTR/SKTC per pt request for low back stretches - discontinued due to transient increase in discomfort despite modification/cues HEP discussion/education  Therapeutic Activity: STS 2x10 from lowest mat 5# suitcase carry 2x12ft BIL cues for posture/pacing    [facility] Adult PT Treatment:                                                DATE: 03/01/24 Therapeutic Exercise: Paced walking for warm up 5x51ft w/ break  STS from high low table x10 BW, 5# x10 Green band hamstring curl, seated x12 BIL HEP handout + education  Neuromuscular re-ed: Airex march 2x8 BIL weaning UE support, CGA cues for pacing and posture  Airex stepover x8 BIL, x8 w/ 5# DB cues for pacing and eccentric control    [facility] Adult PT Treatment:                                                DATE: 02/19/2024  Therapeutic Exercise:  Nustep level 2 x 5 mins  while planning session with patient Seated march 3 x 5 GTB Bridge  2x12 cues for breath control Hooklying clam green band 2 x 12 STS from high-low table 2 x 8   Neuromuscular re-ed: Bosu TKE push 2x8 BIL cues for quad/hamstring co contraction Lateral lunge with slider 2 x 8 at counter for UE support Retro lunge with slider 2 x 8 at counter for UE support     PATIENT EDUCATION:  Education details: rationale for interventions, HEP  Person educated: Patient Education method: Explanation, Demonstration, Tactile cues, Verbal cues Education comprehension: verbalized understanding, returned demonstration, verbal cues required, tactile cues required, and needs further education     HOME EXERCISE PROGRAM: Access Code: [email or web address] URL: [email or web address] Date: 03/01/2024 Prepared by: Mayme Spearman  Exercises - Supine Bridge  - 2-3 x daily - 1 sets - 8 reps - Seated March  - 2-3 x daily - 1 sets - 8 reps - Standing Partial Lunge  - 2-3 x daily - 1 sets - 6 reps - Squat with Chair Touch  - 2-3 x weekly - 2 sets - 10 reps - Seated Hamstring Curl with Anchored Resistance  -  2-3 x weekly - 2 sets - 12 reps  ASSESSMENT:  CLINICAL IMPRESSION: 03/22/2024 Patient had fair tolerance of today's treatment session, which focused on progression of LE strengthening and weight bearing activities. Patient had some pain and fatigue of R LE at end of session, following ambulation activities. We will continue to progress per POC as tolerated, in order to reach established rehab goals.   EVAL: Alliene is a 48 y.o. female who was seen today for physical therapy evaluation and treatment for persistent low back pain and R LE pain.. She is demonstrating decreased LQ MMT scores, decreased R>L hamstring muscle length, decreased LS AROM and decreased LS mobility with CPA assessment. She has related pain and difficulty with squatting, transfers, lower body dressing, and prolonged weight bearing activities as needed for household and occupational duties. She additionally has  impaired sleep quality.   OBJECTIVE IMPAIRMENTS: decreased activity tolerance, decreased ROM, decreased strength, postural dysfunction, and pain.   ACTIVITY LIMITATIONS: lifting, bending, squatting, sleeping, stairs, and dressing  PARTICIPATION LIMITATIONS: cleaning, shopping, community activity, and occupation  PERSONAL FACTORS: Past/current experiences, Time since onset of injury/illness/exacerbation, and 1-2 comorbidities: Relevant PMHx includes HIV, DVT of L LE, Obesity, Patellofemoral pain syndrome  are also affecting patient's functional outcome.   REHAB POTENTIAL: Fair    CLINICAL DECISION MAKING: Evolving/moderate complexity  EVALUATION COMPLEXITY: Moderate   GOALS: Goals reviewed with patient? Yes  SHORT TERM GOALS: Target date: 03/01/2024   Patient will be independent with initial home program at least 4x/week.  03/01/24: reports good adherence overall Goal status: MET  2.  Patient will demonstrate at least 4+/5 MMT with LE strength testing Baseline: see objective measures  Goal status: ONGOING     LONG TERM GOALS: Target date: 03/29/2024    Patient will report improved overall functional ability with LEFS score of 50/80 or greater.  Baseline: 18/80 Goal status: INITIAL  2.  Patient will demonstrate improved R hamstring flexibility by at least 15 degrees.  Baseline: see objective measures  Goal status: INITIAL  3.  Patient will report 5/10 pain at worst with with daily activities and work-related activities Baseline: 9/10 Goal status: INITIAL  4.  Patient will demonstrate ability to perform floor to waist lifting of at least 20# using appropriate body mechanics and with no more than minimal pain in order to safely perform normal daily/occupational tasks.  Goal status: INITIAL   PLAN:  PT FREQUENCY: 1-2x/week  PT DURATION: 8 weeks  PLANNED INTERVENTIONS: [id number]- PT Re-evaluation, 97110-Therapeutic exercises, 97530- Therapeutic activity, [id number]-  Neuromuscular re-education, 97535- Self Care, [address]- Manual therapy, [phone]- Aquatic Therapy, [id number]- Electrical stimulation (unattended), [phone]- Electrical stimulation (manual), [id number]- Traction (mechanical), Patient/Family education, Taping, Dry Needling, Joint mobilization, Joint manipulation, Spinal manipulation, Spinal mobilization, Cryotherapy, and Moist heat  PLAN FOR NEXT SESSION:  assess neck pain and associated objective measures as appropriate per pt preference; Lower quarter mobility and pain modulation via manual therapy, aerobic activities, AAROM/AROM, and modalities   Arlester Bence, PT, DPT  03/22/2024 12:26 PM

## 2024-03-23 NOTE — Progress Notes (Signed)
 The 10-year ASCVD risk score (Arnett DK, et al., 2019) is: 0.8%   Values used to calculate the score:     Age: 48 years     Sex: Female     Is Non-Hispanic African American: No     Diabetic: No     Tobacco smoker: No     Systolic Blood Pressure: 120 mmHg     Is BP treated: No     HDL Cholesterol: 42 mg/dL     Total Cholesterol: 164 mg/dL  Arlon Bergamo, BSN, RN

## 2024-03-24 ENCOUNTER — Ambulatory Visit: Payer: Self-pay | Admitting: Infectious Diseases

## 2024-03-24 LAB — HIV RNA, RTPCR W/R GT (RTI, PI,INT)
HIV 1 RNA Quant: NOT DETECTED {copies}/mL
HIV-1 RNA Quant, Log: NOT DETECTED {Log_copies}/mL

## 2024-03-24 LAB — HEPATITIS A ANTIBODY, TOTAL: Hepatitis A AB,Total: REACTIVE — AB

## 2024-03-24 LAB — HEPATITIS B SURFACE ANTIBODY,QUALITATIVE: Hep B S Ab: NONREACTIVE

## 2024-03-28 NOTE — Therapy (Signed)
 OUTPATIENT PHYSICAL THERAPY DISCHARGE    Patient Name: Kathleen Savage MRN: [id number] DOB:1975/12/08, 48 y.o., female Today's Date: 03/29/2024  PHYSICAL THERAPY DISCHARGE SUMMARY  Visits from Start of Care: 8  Current functional level related to goals / functional outcomes: Able to perform work/daily tasks with modifications - avoiding heavier activities, repetitive bending/stooping   Remaining deficits: Pain, reduced strength, altered mechanics   Education / Equipment: HEP, discharge planning, follow up with provider, activity modification strategies   Patient agrees to discharge. Patient goals were partially met. Patient is being discharged due to the patient's request.   END OF SESSION:  PT End of Session - 03/29/24 1000     Visit Number 8    Number of Visits 16    Date for PT Re-Evaluation 03/29/24    Authorization Type CAFA    PT Start Time 1000    PT Stop Time 1032    PT Time Calculation (min) 32 min    Activity Tolerance Patient tolerated treatment well                    Past Medical History:  Diagnosis Date   Glucose intolerance (pre-diabetes)    HIV infection (HCC) 2005   Hyperlipidemia    Past Surgical History:  Procedure Laterality Date   LOWER EXTREMITY VENOGRAPHY Left 05/12/2023   Procedure: LOWER EXTREMITY VENOGRAPHY;  Surgeon: Margherita Shell, MD;  Location: [facility];  Service: Cardiovascular;  Laterality: Left;   PERIPHERAL VASCULAR BALLOON ANGIOPLASTY  05/12/2023   Procedure: PERIPHERAL VASCULAR BALLOON ANGIOPLASTY;  Surgeon: Margherita Shell, MD;  Location: [facility];  Service: Cardiovascular;;   PERIPHERAL VASCULAR THROMBECTOMY N/A 05/12/2023   Procedure: MECHANICAL THROMBECTOMY;  Surgeon: Margherita Shell, MD;  Location: [facility];  Service: Cardiovascular;  Laterality: N/A;   Patient Active Problem List   Diagnosis Date Noted   HIV disease (HCC) 03/21/2024   Prediabetes 03/10/2024   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 03/10/2024   Metabolic dysfunction-associated steatotic liver disease (MASLD) 03/10/2024   Encounter for immunization 01/12/2024   Screening examination for venereal disease 07/01/2023   Medication monitoring encounter 07/01/2023   Health care maintenance 06/30/2023   Acute deep vein thrombosis (DVT) of iliac vein of left lower extremity (HCC) 05/08/2023   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   Patellofemoral pain syndrome 08/01/2020   Bell's palsy 02/09/2018   Elevated liver enzymes 11/29/2013   Dyslipidemia 01/25/2013   Hyperglycemia 01/25/2013   Multiple lipomas 01/25/2013   Morbid obesity (HCC) 05/07/2010   LOW BACK PAIN, CHRONIC 11/28/2008   Alopecia 08/03/2008   Herpes zoster 12/01/2006   PAP SMEAR, ABNORMAL 12/01/2006   Human immunodeficiency virus (HIV) disease (HCC) 09/14/2006    PCP: Collins Dean, NP  REFERRING PROVIDER: Ulysees Gander, DO  REFERRING DIAG: Chronic bilateral low back pain with bilateral sciatica [M54.42, M54.41, G89.29], Neck pain [M54.2], Right leg pain [M79.604], Bilateral hip pain [M25.551, M25.552]   THERAPY DIAG:  Chronic bilateral low back pain with bilateral sciatica  Neck pain  Bilateral hip pain  Rationale for Evaluation and Treatment: Rehabilitation  ONSET DATE: 2+ years  SUBJECTIVE:   SUBJECTIVE STATEMENT: 03/29/2024 Pt states she is getting epidural injection tomorrow. Since starting PT she feels things are a little better but continues to have significant pain at times, particularly after work days. States she would like to d/c today and see how she does after injection.   EVAL: Patient reports to  PT with primary concern of right-sided low back and right leg pain with unknown onset. She feels that she has the most difficulty with bending her knees and lower body dressing. She sometimes wakes up at night d/t pain especially with laying on her side or back.   PERTINENT HISTORY: Relevant PMHx includes  HIV, DVT of L LE, Obesity, Patellofemoral pain syndrome   PAIN:  Are you having pain?  Yes: NPRS scale: 3/10 at present, 9/10 at worst (work days), 2/10 at best Pain location: right-sided low back, BIL hip/groin, right knee Pain description: tight, burning, aching Aggravating factors: bending, overhead reaching, working   Relieving factors: medicine as needed  PRECAUTIONS: None  RED FLAGS: None   WEIGHT BEARING RESTRICTIONS: No  FALLS:  Has patient fallen in last 6 months? No  LIVING ENVIRONMENT: Lives with: lives with their family and lives with their spouse Lives in: House/apartment Stairs: No  OCCUPATION: works at [employer]   PLOF: Independent  PATIENT GOALS: To have less pain with daily activity  NEXT MD VISIT: 2 weeks after injection  OBJECTIVE:  Note: Objective measures were completed at Evaluation unless otherwise noted.  DIAGNOSTIC FINDINGS:   04/04/2023 Lumbar Spine X-Ray   FINDINGS: No fracture, dislocation or subluxation. No spondylolisthesis. No osteolytic or osteoblastic changes.   Degenerative disc disease noted with disc space narrowing and marginal osteophytes at each lumbar level.   IMPRESSION: Multilevel degenerative changes. No acute osseous abnormalities.   PATIENT SURVEYS:  LEFS 18/80 = 32% perceived functional ability   LEFS 26/80 on 03/29/24  COGNITION: Overall cognitive status: Within functional limits for tasks assessed     MUSCLE LENGTH: Hamstrings (SLR): Right 35 deg; Left 50 deg 03/29/24: 50 deg BIL, subjective tightness R>L   POSTURE: rounded shoulders and forward head  PALPATION: Increased muscle turgor throughout BIL thoracolumbar paraspinal mm; moderate pain and tenderness throughout L1-L5 CPA, with decreased spinal mobility from L3-S1.   LUMBAR ROM:   Active  A/PROM  eval  Flexion 60%  Extension 50%  Right lateral flexion 60%  Left lateral flexion 50%  Right rotation WFL  Left rotation WFL   (Blank rows =  not tested)   % of full AROM    LOWER EXTREMITY ROM:  Active ROM Right eval Left eval  Hip flexion    Hip extension    Hip abduction    Hip adduction    Hip internal rotation    Hip external rotation    Knee flexion    Knee extension    Ankle dorsiflexion    Ankle plantarflexion    Ankle inversion    Ankle eversion     (Blank rows = not tested)  LOWER EXTREMITY MMT:  MMT Right eval Left eval R/L 03/29/24    Hip flexion 4+ 4+ 4+ / 5  Hip extension     Hip abduction 4+ 4+   Hip adduction 4 4   Hip internal rotation 4- 4 5 / 5  Hip external rotation 4 4+ 4+ /4+  Knee flexion 5 5   Knee extension 5 5   Ankle dorsiflexion 5 5   Ankle plantarflexion     Ankle inversion     Ankle eversion      (Blank rows = not tested)  TREATMENT DATE:   [facility] Adult PT Treatment:                                                DATE: 03/29/24 Therapeutic Exercise: HEP review/education, handout provided, education on alternative setups for familiar exercises to improve self efficacy. Pt politely declines full performance  Therapeutic Activity: MSK assessment + education  LEFS+ Education Lifting assessment 5# floor<>waist unable to complete due to ROM 5# 6 inch step 5 repetitions w/ fatigue and LBP>knee pain Education/discussion re: progress with PT, symptom behavior as it affects activity tolerance, PT goals/POC, discharge planning, activity progression/regression as indicated, follow up with provider       PATIENT EDUCATION:  Education details: PT POC, PT goals, progress with PT thus far, discharge planning, HEP, follow up with provider Person educated: Patient Education method: Explanation, Demonstration, Verbal cues Education comprehension: verbalized understanding, returned demonstration    HOME EXERCISE PROGRAM: Access Code:  [email or web address]   ASSESSMENT:  CLINICAL IMPRESSION: 03/29/2024 Pt arrives w/ mild pain, reports fair progress overall since start of care but continues to be quite severe at times, particularly with heavier or work activities. She states she is receiving epidural injection tomorrow and would like to discharge to independent HEP for now until she is able to follow back up with physician. She states she feels more active compared to start of care and feels she can manage current exercise program. She does demonstrate improvement in LE MMT, lifting tolerance/mechanics, and LEFS score compared to start of care but overall NPS rating remains about the same and lifting tolerance remains limited. Given this, mutual decision is made to d/c to independent HEP, discharge education is provided as above. She politely declines full HEP performance in clinic today but we do spend time verbally reviewing and educating on alternative setups to maximize self efficacy. Pt departs today's session in no acute distress, all voiced questions/concerns addressed appropriately from PT perspective.    EVAL: Sherrina is a 48 y.o. female who was seen today for physical therapy evaluation and treatment for persistent low back pain and R LE pain.. She is demonstrating decreased LQ MMT scores, decreased R>L hamstring muscle length, decreased LS AROM and decreased LS mobility with CPA assessment. She has related pain and difficulty with squatting, transfers, lower body dressing, and prolonged weight bearing activities as needed for household and occupational duties. She additionally has impaired sleep quality.   OBJECTIVE IMPAIRMENTS: decreased activity tolerance, decreased ROM, decreased strength, postural dysfunction, and pain.   ACTIVITY LIMITATIONS: lifting, bending, squatting, sleeping, stairs, and dressing  PARTICIPATION LIMITATIONS: cleaning, shopping, community activity, and occupation  PERSONAL FACTORS: Past/current experiences, Time  since onset of injury/illness/exacerbation, and 1-2 comorbidities: Relevant PMHx includes HIV, DVT of L LE, Obesity, Patellofemoral pain syndrome  are also affecting patient's functional outcome.   REHAB POTENTIAL: Fair    CLINICAL DECISION MAKING: Evolving/moderate complexity  EVALUATION COMPLEXITY: Moderate   GOALS: Goals reviewed with patient? Yes  SHORT TERM GOALS: Target date: 03/01/2024   Patient will be independent with initial home program at least 4x/week.  03/01/24: reports good adherence overall Goal status: MET  2.  Patient will demonstrate at least 4+/5 MMT with LE strength testing Baseline: see objective measures  03/29/24: see chart above, at least4+/5 throughout tested groups Goal status: MET      LONG TERM GOALS: Target date: 03/29/2024  Patient will report improved overall functional ability with LEFS score of 50/80 or greater.  Baseline: 18/80 03/29/24: 26/80 Goal status: NOT MET   2.  Patient will demonstrate improved R hamstring flexibility by at least 15 degrees.  Baseline: see objective measures  03/29/24: see objective measures Goal status: MET   3.  Patient will report 5/10 pain at worst with with daily activities and work-related activities Baseline: 9/10 03/29/24: 9/10 worst Goal status: NOT MET   4.  Patient will demonstrate ability to perform floor to waist lifting of at least 20# using appropriate body mechanics and with no more than minimal pain in order to safely perform normal daily/occupational tasks.  03/29/24: 5# for repetition limited by fatigue/pain Goal status: NOT MET    PLAN: DISCHARGE 03/29/24    Lovett Ruck PT, DPT 03/29/2024 1:05 PM

## 2024-03-29 ENCOUNTER — Ambulatory Visit: Payer: Self-pay | Admitting: Physical Therapy

## 2024-03-29 ENCOUNTER — Encounter: Payer: Self-pay | Admitting: Physical Therapy

## 2024-03-29 DIAGNOSIS — M542 Cervicalgia: Secondary | ICD-10-CM

## 2024-03-29 DIAGNOSIS — G8929 Other chronic pain: Secondary | ICD-10-CM

## 2024-03-29 DIAGNOSIS — M25551 Pain in right hip: Secondary | ICD-10-CM

## 2024-03-29 NOTE — Discharge Instructions (Signed)

## 2024-03-30 ENCOUNTER — Ambulatory Visit
Admission: RE | Admit: 2024-03-30 | Discharge: 2024-03-30 | Disposition: A | Discharge status: Home / Self Care | Payer: Self-pay | Source: Ambulatory Visit | Attending: Sports Medicine | Admitting: Sports Medicine

## 2024-03-30 DIAGNOSIS — M51362 Other intervertebral disc degeneration, lumbar region with discogenic back pain and lower extremity pain: Secondary | ICD-10-CM

## 2024-03-30 DIAGNOSIS — G8929 Other chronic pain: Secondary | ICD-10-CM

## 2024-03-30 MED ORDER — METHYLPREDNISOLONE ACETATE 40 MG/ML INJ SUSP (RADIOLOG
80.0000 mg | Freq: Once | INTRAMUSCULAR | Status: AC
  Administered 2024-03-30: 80 mg via EPIDURAL

## 2024-03-30 MED ORDER — IOPAMIDOL (ISOVUE-M 200) INJECTION 41%
1.0000 mL | Freq: Once | INTRAMUSCULAR | Status: AC
  Administered 2024-03-30: 1 mL via EPIDURAL

## 2024-04-11 NOTE — Progress Notes (Signed)
 Ben Kaiyan Luczak D.[facility] [address] [place] [address] Phone: [phone]   Assessment and Plan:     1. Chronic bilateral low back pain with right-sided sciatica 2. Degeneration of intervertebral disc of lumbar region with discogenic back pain and lower extremity pain  -Chronic with exacerbation, subsequent visit - Overall improvement after first epidural CSI at right-sided L5-S1 performed on 03/30/2024.  Consistent with improving symptoms from degenerative changes and disc herniation at L5-S1 as seen on MRI - Patient feels that she had 30 to 50% improvement after epidural CSI with decreased need for pain medication and improved function.  Based on improvement, though continued pain, recommend additional epidural CSI at right-sided L5-S1.  Order placed - Start Tylenol  500 to 1000 mg tablets 2-3 times a day for day-to-day pain relief  -No significant relief with meloxicam , prednisone  courses, HEP, physical therapy. - May use meloxicam  15 mg daily as needed for breakthrough pain.  Recommend limiting chronic NSAIDs 1-2 doses per week  Pertinent previous records reviewed include epidural procedure note 03/30/2024  Follow Up: 2 weeks after second epidural to review benefit   Subjective:   I, Leone Ralphs am a scribe for Dr. Cleora Daft.    Chief Complaint: bilat leg  and back pain    HPI:    12/29/2023 Patient is a 48 year old female with bilat leg and bakl  pain. Patient states pain for about 2 years. No MOI. Pain starts at the neck and goes down her low back to her hip , groin and then the knee. Tylenol  with codein for the pain . Didn't help with the pain made her sleepy. No numbness and tingling. Just pain. She has a hard time standing up straight, antalgic gait. Constant pain. 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 She is  currently moving . Pain is in her groin and knees    02/29/2024 Patient states steroids didn't help at all    03/22/2024 Patient states that pain is 5/10 today. Have not seen any improvement really.   04/12/2024 Patient states a little bit of pain in the legs today. Seen some improvement.    Relevant Historical Information: History of DVT in left leg with recent completion of 72-month course of anticoagulation, HIV,  Additional pertinent review of systems negative.   Current Outpatient Medications:    acetaminophen -codeine  (TYLENOL  #3) 300-30 MG tablet, Take 1-2 tablets by mouth every 8 (eight) hours as needed for moderate pain (pain score 4-6)., Disp: 60 tablet, Rfl: 0   atorvastatin  (LIPITOR) 10 MG tablet, Take 1 tablet (10 mg total) by mouth daily., Disp: 30 tablet, Rfl: 5   bictegravir-emtricitabine-tenofovir  AF (BIKTARVY ) 50-200-25 MG TABS tablet, Take 1 tablet by mouth daily., Disp: 30 tablet, Rfl: 11   DICLOFENAC  PO, Take 100 mg by mouth daily., Disp: , Rfl:    meloxicam  (MOBIC ) 15 MG tablet, Take 1 tablet (15 mg total) by mouth daily., Disp: 30 tablet, Rfl: 0   Objective:     Vitals:   04/12/24 1257  BP: 122/70  Pulse: 84  SpO2: 96%  Height: 5\' 2"  (1.575 m)      Body mass index is 47.81 kg/m.    Physical Exam:    Gen: Appears well, nad, nontoxic and pleasant Psych: Alert and oriented, appropriate mood and affect  Neuro: sensation intact, strength is 5/5 in upper and lower extremities, muscle tone wnl Skin: no susupicious lesions or rashes   Back - Normal skin, Spine with normal alignment and no deformity.   No tenderness to vertebral process palpation.   Bilateral paraspinous muscles are   tender and without spasm NTTP gluteal musculature Straight leg raise reproduced low back and leg pain, though not typical radicular symptoms Trendelenberg painful bilaterally Unable to perform piriformis Test due to pain with movement Positive FABER and FADIR for anterior and  lateral and posterior hip pain, all worse on right compared to left Gait normal    Electronically signed by:  Marshall Skeeter D.[facility] 1:21 PM 04/12/24

## 2024-04-12 ENCOUNTER — Ambulatory Visit (INDEPENDENT_AMBULATORY_CARE_PROVIDER_SITE_OTHER): Payer: Self-pay | Admitting: Sports Medicine

## 2024-04-12 VITALS — BP 122/70 | HR 84 | Ht 62.0 in

## 2024-04-12 DIAGNOSIS — M51362 Other intervertebral disc degeneration, lumbar region with discogenic back pain and lower extremity pain: Secondary | ICD-10-CM

## 2024-04-12 DIAGNOSIS — M5441 Lumbago with sciatica, right side: Secondary | ICD-10-CM

## 2024-04-12 DIAGNOSIS — G8929 Other chronic pain: Secondary | ICD-10-CM

## 2024-04-12 NOTE — Patient Instructions (Addendum)
 Repeat epidural right sided L5-S1. Follow up 2 weeks after epidural.

## 2024-04-13 ENCOUNTER — Other Ambulatory Visit: Payer: Self-pay | Admitting: Sports Medicine

## 2024-04-14 ENCOUNTER — Other Ambulatory Visit: Payer: Self-pay

## 2024-04-20 NOTE — Discharge Instructions (Signed)

## 2024-04-22 ENCOUNTER — Ambulatory Visit
Admission: RE | Admit: 2024-04-22 | Discharge: 2024-04-22 | Disposition: A | Discharge status: Home / Self Care | Payer: Self-pay | Source: Ambulatory Visit | Attending: Sports Medicine | Admitting: Sports Medicine

## 2024-04-22 DIAGNOSIS — M51362 Other intervertebral disc degeneration, lumbar region with discogenic back pain and lower extremity pain: Secondary | ICD-10-CM

## 2024-04-22 DIAGNOSIS — G8929 Other chronic pain: Secondary | ICD-10-CM

## 2024-04-22 MED ORDER — METHYLPREDNISOLONE ACETATE 40 MG/ML INJ SUSP (RADIOLOG
80.0000 mg | Freq: Once | INTRAMUSCULAR | Status: AC
Start: 2024-04-22 — End: 2024-04-22
  Administered 2024-04-22: 80 mg via EPIDURAL

## 2024-04-22 MED ORDER — IOPAMIDOL (ISOVUE-M 200) INJECTION 41%
1.0000 mL | Freq: Once | INTRAMUSCULAR | Status: AC
  Administered 2024-04-22: 1 mL via EPIDURAL

## 2024-05-03 NOTE — Progress Notes (Signed)
 Kathleen Savage [facility] [address] [place] [address] Phone: [phone]   Assessment and Plan:     1. Chronic bilateral low back pain with right-sided sciatica 2. Degeneration of intervertebral disc of lumbar region with discogenic back pain and lower extremity pain -Chronic with exacerbation, subsequent visit - Patient has had overall improvement with 2 epidural CSI to right sided L5-S1 with most recent on 04/22/2024, however patient continues to experience debilitating pain radiating into bilateral hips most consistent with degenerative changes and disc herniation at L5-S1 as seen on prior MRI.  Differential includes flare of SI joint pain and hip osteoarthritis, though hip OA is mild on x-ray and physical exam is more consistent with lumbar etiology. - Due to incomplete improvement with epidural CSI, I recommend patient establish care with neurosurgery to discuss next steps in treatment plan.  - Use Tylenol  500 to 1000 mg tablets 2-3 times a day for day-to-day pain relief - Use meloxicam  15 mg daily as needed for pain.  Recommend limiting chronic NSAIDs to 1-2 doses per week to prevent long-term side effects. - No significant relief with meloxicam  course, prednisone  course, HEP, physical therapy  Pertinent previous records reviewed include epidural CSI procedure note 04/22/2024  Follow Up: After neurosurgery appointment to review recommendations and discuss next steps in treatment plan   Subjective:   I, Kathleen Savage, am serving as a Neurosurgeon for Kathleen Savage  Chief Complaint: bilat leg  and back pain    HPI:    12/29/2023 Patient is a 48 year old female with bilat leg and bakl  pain. Patient states pain for about 2 years. No MOI. Pain starts at the neck and goes down her low back to her hip , groin and then the knee. Tylenol  with codein for the pain . Didn't help with the pain made her sleepy. No numbness and tingling. Just  pain. She has a hard time standing up straight, antalgic gait. Constant pain. 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 She is currently moving . Pain is in her groin and knees    02/29/2024 Patient states steroids didn't help at all    03/22/2024 Patient states that pain is 5/10 today. Have not seen any improvement really.    04/12/2024 Patient states a little bit of pain in the legs today. Seen some improvement.   05/09/2024 Patient states she still has intermittent pain in the legs and groin. Flares usually start about 4 hours after starting work. Would like a refill of meloxicam      Relevant Historical Information: History of DVT in left leg with recent completion of 20-month course of anticoagulation, HIV,  Additional pertinent review of systems negative.   Current Outpatient Medications:    meloxicam  (MOBIC ) 15 MG tablet, Take 1 tablet (15 mg total) by mouth daily as needed for pain., Disp: 30 tablet, Rfl: 0   acetaminophen -codeine  (TYLENOL  #3) 300-30 MG tablet, Take 1-2 tablets by mouth every 8 (eight) hours as needed for moderate pain (pain score 4-6)., Disp: 60 tablet, Rfl: 0   atorvastatin  (LIPITOR) 10 MG tablet, Take 1 tablet (10 mg total) by mouth daily., Disp: 30 tablet, Rfl: 5   bictegravir-emtricitabine-tenofovir  AF (BIKTARVY ) 50-200-25 MG TABS tablet, Take 1 tablet by mouth daily., Disp: 30 tablet, Rfl: 11  DICLOFENAC  PO, Take 100 mg by mouth daily., Disp: , Rfl:    meloxicam  (MOBIC ) 15 MG tablet, Take 1 tablet (15 mg total) by mouth daily., Disp: 30 tablet, Rfl: 0   Objective:     Vitals:   05/09/24 1332  Pulse: 98  SpO2: 99%  Weight: 253 lb (114.8 kg)  Height: 5' 2 (1.575 m)      Body mass index is 46.27 kg/m.    Physical Exam:    Gen: Appears well, nad, nontoxic and pleasant Psych: Alert and oriented, appropriate mood and affect Neuro:  sensation intact, strength is 5/5 in upper and lower extremities, muscle tone wnl Skin: no susupicious lesions or rashes   Back - Normal skin, Spine with normal alignment and no deformity.   No tenderness to vertebral process palpation.   Bilateral paraspinous muscles are   tender and without spasm NTTP gluteal musculature Straight leg raise reproduced low back and leg pain, though not typical radicular symptoms Trendelenberg painful bilaterally Unable to perform piriformis Test due to pain with movement Positive FABER and FADIR for anterior and lateral and posterior hip pain, all worse on right compared to left Gait normal     Electronically signed by:  Kathleen Savage [facility] 2:19 PM 05/09/24

## 2024-05-09 ENCOUNTER — Other Ambulatory Visit: Payer: Self-pay

## 2024-05-09 ENCOUNTER — Ambulatory Visit (INDEPENDENT_AMBULATORY_CARE_PROVIDER_SITE_OTHER): Payer: Self-pay | Admitting: Sports Medicine

## 2024-05-09 VITALS — HR 98 | Ht 62.0 in | Wt 253.0 lb

## 2024-05-09 DIAGNOSIS — G8929 Other chronic pain: Secondary | ICD-10-CM

## 2024-05-09 DIAGNOSIS — M5441 Lumbago with sciatica, right side: Secondary | ICD-10-CM

## 2024-05-09 DIAGNOSIS — M51362 Other intervertebral disc degeneration, lumbar region with discogenic back pain and lower extremity pain: Secondary | ICD-10-CM

## 2024-05-09 MED ORDER — MELOXICAM 15 MG PO TABS
15.0000 mg | ORAL_TABLET | Freq: Every day | ORAL | 0 refills | Status: AC | PRN
  Filled 2024-05-09: qty 30, 30d supply, fill #0

## 2024-05-09 NOTE — Patient Instructions (Signed)
 Neuro referral  - Use meloxicam  15 mg daily as needed for pain.  Recommend limiting chronic NSAIDs to 1-2 doses per week to prevent long-term side effects. Meloxicam  refill  Tylenol  [phone] mg 2-3 times a day for pain relief  Follow up after neurosurgery visit

## 2024-05-12 ENCOUNTER — Other Ambulatory Visit: Payer: Self-pay

## 2024-05-26 NOTE — Progress Notes (Signed)
 Referring Physician:  Leonce Katz, DO [address] [place],  [place] [address]  Primary Physician:  Theotis Haze ORN, NP  History of Present Illness: 05/31/2024 Kathleen Savage is here today with a chief complaint of chronic back and leg pain who presents for evaluation of persistent symptoms.  She experiences chronic right-sided back pain for over a year, not worsened by pressure. Leg pain extends from the buttock to the front of the leg and down to the knees, exacerbated by prolonged standing, walking, and lifting at work. Two injections provided limited relief. She has difficulty walking upstairs with pain in the groin and legs. No numbness or tingling in the legs, though previous abdominal tingling improved post-injections.  Bowel/Bladder Dysfunction: none  Conservative measures:  Physical therapy: has participated in PT with [facility]  Multimodal medical therapy including regular antiinflammatories:  Tylenol , Meloxicam , Prednisone , Tylenol  with Codeine , Diclofenac  Injections:  03/30/2024-ESI right L5-S1  04/22/2024-CSI right L5-S1   Past Surgery: no spine surgery  Kathleen Savage has no symptoms of cervical myelopathy.  The symptoms are causing a significant impact on the patient's life.   I have utilized the care everywhere function in epic to review the outside records available from external health systems.  Review of Systems:  A 10 point review of systems is negative, except for the pertinent positives and negatives detailed in the HPI.  Past Medical History: Past Medical History:  Diagnosis Date   Glucose intolerance (pre-diabetes)    HIV infection (HCC) 2005   Hyperlipidemia     Past Surgical History: Past Surgical History:  Procedure Laterality Date   LOWER EXTREMITY VENOGRAPHY Left 05/12/2023   Procedure: LOWER EXTREMITY VENOGRAPHY;  Surgeon: Serene Gaile ORN, MD;  [facility];  Service: Cardiovascular;  Laterality: Left;    PERIPHERAL VASCULAR BALLOON ANGIOPLASTY  05/12/2023   Procedure: PERIPHERAL VASCULAR BALLOON ANGIOPLASTY;  Surgeon: Serene Gaile ORN, MD;  [facility];  Service: Cardiovascular;;   PERIPHERAL VASCULAR THROMBECTOMY N/A 05/12/2023   Procedure: MECHANICAL THROMBECTOMY;  Surgeon: Serene Gaile ORN, MD;  [facility];  Service: Cardiovascular;  Laterality: N/A;    Allergies: Allergies as of 05/31/2024   (No Known Allergies)    Medications:  Current Outpatient Medications:    acetaminophen -codeine  (TYLENOL  #3) 300-30 MG tablet, Take 1-2 tablets by mouth every 8 (eight) hours as needed for moderate pain (pain score 4-6)., Disp: 60 tablet, Rfl: 0   atorvastatin  (LIPITOR) 10 MG tablet, Take 1 tablet (10 mg total) by mouth daily., Disp: 30 tablet, Rfl: 5   bictegravir-emtricitabine-tenofovir  AF (BIKTARVY ) 50-200-25 MG TABS tablet, Take 1 tablet by mouth daily., Disp: 30 tablet, Rfl: 11   meloxicam  (MOBIC ) 15 MG tablet, Take 1 tablet (15 mg total) by mouth daily as needed for pain., Disp: 30 tablet, Rfl: 0  Social History: Social History   Tobacco Use   Smoking status: Never   Smokeless tobacco: Never  Vaping Use   Vaping status: Never Used  Substance Use Topics   Alcohol use: Yes    Alcohol/week: 3.0 standard drinks of alcohol    Types: 1 Glasses of wine, 1 Cans of beer, 1 Shots of liquor per week    Comment: occasional   Drug use: No    Family Medical History: Family History  Problem Relation Age of Onset   Cancer Maternal Aunt        Breast Cancer   Breast cancer Maternal Aunt    Cancer Cousin  Breast cancer - maternal cousin   Breast cancer Cousin    Cancer Cousin        breast cancer   Breast cancer Cousin    Cancer Cousin        breast cancer   Cancer Maternal Aunt        stomach cancer   Breast cancer Maternal Aunt    Hypertension Mother    Stroke Father    Cancer Sister    Uterine cancer Sister     Physical Examination: Vitals:    05/31/24 0832  BP: 128/70    General: Patient is in no apparent distress. Attention to examination is appropriate.  Neck:   Supple.  Full range of motion.  Respiratory: Patient is breathing without any difficulty.   NEUROLOGICAL:     Awake, alert, oriented to person, place, and time.  Speech is clear and fluent.   Cranial Nerves: Pupils equal round and reactive to light.  Facial tone is symmetric.  Facial sensation is symmetric. Shoulder shrug is symmetric. Tongue protrusion is midline.  There is no pronator drift.  Strength: Side Biceps Triceps Deltoid Interossei Grip Wrist Ext. Wrist Flex.  R 5 5 5 5 5 5 5   L 5 5 5 5 5 5 5    Side Iliopsoas Quads Hamstring PF DF EHL  R 5 5 5 5 5 5   L 5 5 5 5 5 5    Reflexes are 2+ and symmetric at the biceps, triceps, brachioradialis, patella and achilles.   Hoffman's is absent.   Bilateral upper and lower extremity sensation is intact to light touch.    No evidence of dysmetria noted.  Gait is normal.    FABER + on R Mild TTP around knee joints   Medical Decision Making  Imaging: MRI L spine 03/17/2024 FINDINGS: There is normal alignment of the lumbar spine. Mild degenerative disc this case and mild to moderate facet arthrosis is present. There is no vertebral body height loss, subluxation or marrow replacing process. The sacrum and SI joints are unremarkable so far as visualized. Conus and cauda equina are unremarkable.   T12-L1: There is a mild broad-based disc osteophyte mild facet arthrosis. No significant foraminal or spinal stenosis is present.   L1-2: Mild broad-based disc osteophyte facet arthrosis. No significant foraminal or spinal stenosis   L2-3: Disc desiccation and mild/moderate facet arthrosis. No significant foraminal or spinal stenosis.   L3-4: Mild broad-based bulge and mild-to-moderate facet arthrosis. No significant foraminal or spinal stenosis.   L4-5: Mild broad-based bulge and moderate facet  arthrosis. There is slight effacement of the ventral thecal sac. There is no significant foraminal or spinal stenosis.   L5-S1: Mild broad-based disc osteophyte moderate facet arthrosis. There is effacement of the thecal sac slightly crowding the descending nerve roots in the lateral recess. There is moderate bilateral foraminal narrowing, left greater than right. Correlation for mild L5 radiculopathy.   The retroperitoneal structures demonstrate no significant abnormality.   IMPRESSION: Mild degenerative disc desiccation and moderate multilevel facet arthrosis. There is caudal foraminal narrowing most notably at L5-S1, left greater than right. Correlation left L5 radiculopathy.   No acute abnormality.   Electronically signed by: Norleen Satchel MD 03/18/2024 04:42 PM EDT RP Workstation: [id number]  I have personally reviewed the images and agree with the above interpretation.  Assessment and Plan: Kathleen Savage is a pleasant 48 y.o. female with back pain as well as right leg pain.  I am concerned that some of her right  leg pain may be secondary to hip pathology.  An additional thought would be lateral femoral cutaneous nerve impingement.  I will reach out to Dr. Leonce to discuss the utility of a hip injection versus nerve conduction study to evaluate for meralgia paresthetica.  There is no impingement on her lumbar spine which could explain her leg symptoms.  She does have some degenerative disc disease which can cause back pain.  I would not recommend surgical intervention for this.   I spent a total of 30 minutes in this patient's care today. This time was spent reviewing pertinent records including imaging studies, obtaining and confirming history, performing a directed evaluation, formulating and discussing my recommendations, and documenting the visit within the medical record.     Thank you for involving me in the care of this patient.      Bawi Lakins K. Clois MD,  [facility]

## 2024-05-31 ENCOUNTER — Encounter: Payer: Self-pay | Admitting: Neurosurgery

## 2024-05-31 ENCOUNTER — Ambulatory Visit (INDEPENDENT_AMBULATORY_CARE_PROVIDER_SITE_OTHER): Payer: Self-pay | Admitting: Neurosurgery

## 2024-05-31 VITALS — BP 128/70 | Ht 62.0 in | Wt 260.0 lb

## 2024-05-31 DIAGNOSIS — M79604 Pain in right leg: Secondary | ICD-10-CM

## 2024-05-31 DIAGNOSIS — M545 Low back pain, unspecified: Secondary | ICD-10-CM

## 2024-05-31 DIAGNOSIS — G8929 Other chronic pain: Secondary | ICD-10-CM

## 2024-06-03 ENCOUNTER — Other Ambulatory Visit: Payer: Self-pay | Admitting: Sports Medicine

## 2024-06-03 ENCOUNTER — Other Ambulatory Visit: Payer: Self-pay | Admitting: Nurse Practitioner

## 2024-06-03 DIAGNOSIS — G8929 Other chronic pain: Secondary | ICD-10-CM

## 2024-06-06 NOTE — Progress Notes (Unsigned)
    Ben Jackson D.[name] [address] [place] [address] Phone: [phone]   Assessment and Plan:     There are no diagnoses linked to this encounter.  ***   Pertinent previous records reviewed include ***    Follow Up: ***     Subjective:   I, Kathleen Savage, am serving as a Neurosurgeon for Doctor Morene Mace   Chief Complaint: bilat leg  and back pain    HPI:    12/29/2023 Patient is a 48 year old female with bilat leg and bakl  pain. Patient states pain for about 2 years. No MOI. Pain starts at the neck and goes down her low back to her hip , groin and then the knee. Tylenol  with codein for the pain . Didn't help with the pain made her sleepy. No numbness and tingling. Just pain. She has a hard time standing up straight, antalgic gait. Constant pain. 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 She is currently moving . Pain is in her groin and knees    02/29/2024 Patient states steroids didn't help at all    03/22/2024 Patient states that pain is 5/10 today. Have not seen any improvement really.    04/12/2024 Patient states a little bit of pain in the legs today. Seen some improvement.    05/09/2024 Patient states she still has intermittent pain in the legs and groin. Flares usually start about 4 hours after starting work. 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   Current Outpatient Medications:    acetaminophen -codeine  (TYLENOL  #3) 300-30 MG tablet, Take 1-2 tablets by mouth every 8 (eight) hours as needed for moderate pain (pain score 4-6)., Disp: 60 tablet, Rfl: 0   atorvastatin  (LIPITOR) 10 MG tablet, Take 1  tablet (10 mg total) by mouth daily., Disp: 30 tablet, Rfl: 5   bictegravir-emtricitabine-tenofovir  AF (BIKTARVY ) 50-200-25 MG TABS tablet, Take 1 tablet by mouth daily., Disp: 30 tablet, Rfl: 11   meloxicam  (MOBIC ) 15 MG tablet, Take 1 tablet (15 mg total) by mouth daily as needed for pain., Disp: 30 tablet, Rfl: 0   Objective:     There were no vitals filed for this visit.    There is no height or weight on file to calculate BMI.    Physical Exam:    ***   Electronically signed by:  Odis Mace D.[name] 12:46 PM 06/06/24

## 2024-06-07 ENCOUNTER — Other Ambulatory Visit: Payer: Self-pay

## 2024-06-07 ENCOUNTER — Ambulatory Visit: Payer: Self-pay | Admitting: Sports Medicine

## 2024-06-07 ENCOUNTER — Encounter: Payer: Self-pay | Admitting: Pharmacist

## 2024-06-07 VITALS — HR 90 | Ht 62.0 in | Wt 260.0 lb

## 2024-06-07 DIAGNOSIS — M160 Bilateral primary osteoarthritis of hip: Secondary | ICD-10-CM

## 2024-06-07 DIAGNOSIS — M25551 Pain in right hip: Secondary | ICD-10-CM

## 2024-06-07 DIAGNOSIS — G8929 Other chronic pain: Secondary | ICD-10-CM

## 2024-06-07 DIAGNOSIS — M25552 Pain in left hip: Secondary | ICD-10-CM

## 2024-06-07 DIAGNOSIS — M51362 Other intervertebral disc degeneration, lumbar region with discogenic back pain and lower extremity pain: Secondary | ICD-10-CM

## 2024-06-07 DIAGNOSIS — M5441 Lumbago with sciatica, right side: Secondary | ICD-10-CM

## 2024-06-07 NOTE — Patient Instructions (Signed)
3-4 week follow up

## 2024-06-08 ENCOUNTER — Telehealth: Payer: Self-pay | Admitting: Sports Medicine

## 2024-06-08 NOTE — Telephone Encounter (Signed)
 Pt called, had hip injection here yesterday. Today has warm, red spots on her arm. Questioning if this could be a reaction.

## 2024-06-09 MED ORDER — ACETAMINOPHEN-CODEINE 300-30 MG PO TABS
1.0000 | ORAL_TABLET | Freq: Three times a day (TID) | ORAL | 0 refills | Status: DC | PRN
  Filled 2024-06-09: qty 60, 10d supply, fill #0

## 2024-06-09 NOTE — Telephone Encounter (Signed)
 Called and spoke with patient and repeated Dr. Leonce message. I expressed that if the reaction gets worse she should go to UC and or ED . She vocalized understanding

## 2024-06-10 ENCOUNTER — Other Ambulatory Visit: Payer: Self-pay

## 2024-06-14 ENCOUNTER — Other Ambulatory Visit: Payer: Self-pay

## 2024-06-14 ENCOUNTER — Encounter: Payer: Self-pay | Admitting: Infectious Diseases

## 2024-06-14 ENCOUNTER — Ambulatory Visit: Payer: Self-pay | Admitting: Infectious Diseases

## 2024-06-14 VITALS — BP 142/81 | HR 79 | Temp 97.7°F | Ht 62.0 in | Wt 258.0 lb

## 2024-06-14 DIAGNOSIS — B20 Human immunodeficiency virus [HIV] disease: Secondary | ICD-10-CM

## 2024-06-14 DIAGNOSIS — Z6841 Body Mass Index (BMI) 40.0 and over, adult: Secondary | ICD-10-CM

## 2024-06-14 DIAGNOSIS — Z0000 Encounter for general adult medical examination without abnormal findings: Secondary | ICD-10-CM

## 2024-06-14 DIAGNOSIS — Z113 Encounter for screening for infections with a predominantly sexual mode of transmission: Secondary | ICD-10-CM

## 2024-06-14 DIAGNOSIS — Z5181 Encounter for therapeutic drug level monitoring: Secondary | ICD-10-CM

## 2024-06-14 DIAGNOSIS — E785 Hyperlipidemia, unspecified: Secondary | ICD-10-CM

## 2024-06-14 DIAGNOSIS — Z7185 Encounter for immunization safety counseling: Secondary | ICD-10-CM | POA: Insufficient documentation

## 2024-06-14 DIAGNOSIS — R7303 Prediabetes: Secondary | ICD-10-CM

## 2024-06-14 DIAGNOSIS — Z23 Encounter for immunization: Secondary | ICD-10-CM

## 2024-06-14 NOTE — Progress Notes (Signed)
 [address], [place], [place], [address]                                                                  Phn. [phone]; Fax: [phone]                                                                             Date: 06/14/24  Reason for Visit: HIV fu  HPI: Kathleen Savage is a 48 y.o.old female with a history of HIV,  prediabetes and HLD, previously seen by Dr Elaine for a long time who is here for fu.   Interval hx/current visit:  Compliant with Biktarvy , no missed doses or concerns. She been closely following sports medicine and neurosurgery for back pain and hip pain and had injections in her back and rt hip but did not seem to help much and pain starting to come back. She is agreeable to see dentist at [facility] and start HBV vaccine series. No other concerns.   ROS: As stated in above HPI; all other systems were reviewed and are otherwise negative unless noted below  No reported fever / chills, night sweats, unintentional weight loss, acute visual change, odynophagia, chest pain/pressure, new or worsened SOB or WOB, nausea, vomiting, diarrhea, dysuria, GU discharge, syncope, seizures, red/hot swollen joints, hallucinations / delusions, rashes, new allergies, unusual / excessive bleeding, swollen lymph nodes  PMH/ PSH/ FamHx / Social Hx , medications and allergies reviewed and updated as appropriate; please see corresponding tab in EHR / prior notes  Current Outpatient Medications on File Prior to Visit  Medication Sig Dispense Refill   acetaminophen -codeine  (TYLENOL  #3) 300-30 MG tablet Take 1-2 tablets by mouth every 8 (eight) hours as needed for moderate pain (pain score 4-6). 60 tablet 0   atorvastatin  (LIPITOR) 10 MG tablet Take 1 tablet (10 mg total) by mouth daily. 30 tablet 5    bictegravir-emtricitabine-tenofovir  AF (BIKTARVY ) 50-200-25 MG TABS tablet Take 1 tablet by mouth daily. 30 tablet 11   meloxicam  (MOBIC ) 15 MG tablet Take 1 tablet (15 mg total) by mouth daily as needed for pain. 30 tablet 0   No current facility-administered medications on file prior to visit.   No Known Allergies  Past Medical History:  Diagnosis Date   Glucose intolerance (pre-diabetes)    HIV infection (HCC) 2005   Hyperlipidemia    Past Surgical History:  Procedure Laterality Date   LOWER EXTREMITY VENOGRAPHY Left 05/12/2023   Procedure: LOWER EXTREMITY VENOGRAPHY;  Surgeon: Serene Gaile ORN, MD;  [facility];  Service: Cardiovascular;  Laterality: Left;   PERIPHERAL VASCULAR BALLOON ANGIOPLASTY  05/12/2023  Procedure: PERIPHERAL VASCULAR BALLOON ANGIOPLASTY;  Surgeon: Serene Gaile ORN, MD;  [facility];  Service: Cardiovascular;;   PERIPHERAL VASCULAR THROMBECTOMY N/A 05/12/2023   Procedure: MECHANICAL THROMBECTOMY;  Surgeon: Serene Gaile ORN, MD;  [facility];  Service: Cardiovascular;  Laterality: N/A;    Social History   Socioeconomic History   Marital status: Married    Spouse name: Not on file   Number of children: 2   Years of education: Not on file   Highest education level: 6th grade  Occupational History   Not on file  Tobacco Use   Smoking status: Never   Smokeless tobacco: Never  Vaping Use   Vaping status: Never Used  Substance and Sexual Activity   Alcohol use: Yes    Alcohol/week: 3.0 standard drinks of alcohol    Types: 1 Glasses of wine, 1 Cans of beer, 1 Shots of liquor per week    Comment: occasional   Drug use: No   Sexual activity: Yes    Partners: Male    Birth control/protection: Condom    Comment: given condoms  Other Topics Concern   Not on file  Social History Narrative   Not on file   Social Drivers of Health   Financial Resource Strain: Low Risk  (01/06/2024)   Overall Financial  Resource Strain (CARDIA)    Difficulty of Paying Living Expenses: Not very hard  Food Insecurity: No Food Insecurity (01/06/2024)   Hunger Vital Sign    Worried About Running Out of Food in the Last Year: Never true    Ran Out of Food in the Last Year: Never true  Transportation Needs: No Transportation Needs (01/06/2024)   PRAPARE - Administrator, Civil Service (Medical): No    Lack of Transportation (Non-Medical): No  Physical Activity: Insufficiently Active (01/06/2024)   Exercise Vital Sign    Days of Exercise per Week: 3 days    Minutes of Exercise per Session: 30 min  Stress: Stress Concern Present (01/06/2024)   [employer] of Occupational Health - Occupational Stress Questionnaire    Feeling of Stress : Rather much  Social Connections: Moderately Isolated (01/06/2024)   Social Connection and Isolation Panel    Frequency of Communication with Friends and Family: Three times a week    Frequency of Social Gatherings with Friends and Family: Once a week    Attends Religious Services: Never    Database administrator or Organizations: No    Attends Banker Meetings: Never    Marital Status: Married  Catering manager Violence: Not At Risk (10/02/2023)   Humiliation, Afraid, Rape, and Kick questionnaire    Fear of Current or Ex-Partner: No    Emotionally Abused: No    Physically Abused: No    Sexually Abused: No   Family History  Problem Relation Age of Onset   Cancer Maternal Aunt        Breast Cancer   Breast cancer Maternal Aunt    Cancer Cousin        Breast cancer - maternal cousin   Breast cancer Cousin    Cancer Cousin        breast cancer   Breast cancer Cousin    Cancer Cousin        breast cancer   Cancer Maternal Aunt        stomach cancer   Breast cancer Maternal Aunt    Hypertension Mother    Stroke Father  Cancer Sister    Uterine cancer Sister     Vitals  BP (!) 142/81   Pulse 79   Temp 97.7 F (36.5 C) (Oral)   Ht  5' 2 (1.575 m)   Wt 258 lb (117 kg)   LMP 05/28/2024   SpO2 99%   BMI 47.19 kg/m   Examination  Gen: no acute distress, morbidly obese  HEENT: [place]/AT, no scleral icterus, no pale conjunctivae, hearing normal, oral mucosa moist Neck: Supple Cardio: Regular rate and rhythm Resp: Pulmonary effort normal in room air GI: nondistended GU: Musc: Extremities: No pedal edema Skin: No rashes Neuro: grossly non focal , awake, alert and oriented * 3  Psych: Calm, cooperative  Lab Results HIV 1 RNA Quant  Date Value  03/21/2024 NOT DETECTED copies/mL  01/12/2024 1,100,000 copies/mL (H)  06/16/2023 Not Detected Copies/mL   HIV-1 RNA Viral Load (copies/mL)  Date Value  12/23/2021 <20   CD4 T Cell Abs (/uL)  Date Value  03/21/2024 835  01/12/2024 322 (L)  06/16/2023 877   No results found for: HIV1GENOSEQ Lab Results  Component Value Date   WBC 5.4 12/17/2023   HGB 12.0 12/17/2023   HCT 36.3 12/17/2023   MCV 88.3 12/17/2023   PLT 334 12/17/2023    Lab Results  Component Value Date   CREATININE 0.67 12/17/2023   BUN 11 12/17/2023   NA 138 12/17/2023   K 4.2 12/17/2023   CL 107 12/17/2023   CO2 27 12/17/2023   Lab Results  Component Value Date   ALT 27 12/17/2023   AST 20 12/17/2023   ALKPHOS 46 12/17/2023   BILITOT 0.4 12/17/2023    Lab Results  Component Value Date   CHOL 164 01/12/2024   TRIG 133 01/12/2024   HDL 42 (L) 01/12/2024   LDLCALC 99 01/12/2024   Lab Results  Component Value Date   HAV REACTIVE (A) 03/21/2024   Lab Results  Component Value Date   HEPBSAG NEGATIVE 05/30/2014   HEPBSAB NON-REACTIVE 03/21/2024   Lab Results  Component Value Date   HCVAB NEGATIVE 05/30/2014   Lab Results  Component Value Date   CHLAMYDIAWP Negative 02/02/2024   N Negative 02/02/2024   No results found for: GCPROBEAPT No results found for: QUANTGOLD   Health Maintenance: Immunization History  Administered Date(s) Administered   H1N1 11/28/2008    Hepatitis B 12/04/2003, 07/02/2004, 11/27/2004   Hepatitis B, ADULT 06/13/2014, 01/23/2015   Influenza Split 08/04/2011, 07/27/2012   Influenza Whole 09/08/2006, 09/14/2007, 08/03/2008, 10/23/2009, 09/10/2010   Influenza, Seasonal, Injecte, Preservative Fre 01/12/2024   Influenza,inj,Quad PF,6+ Mos 11/29/2013, 01/23/2015, 08/09/2015, 07/22/2016, 07/28/2017, 09/14/2018, 12/23/2021, 12/29/2022   Meningococcal Mcv4o 01/27/2017, 09/14/2018   PNEUMOCOCCAL CONJUGATE-20 01/12/2024   Pneumococcal Conjugate-13 09/14/2018   Pneumococcal Polysaccharide-23 10/10/2003, 11/28/2008   Tdap 02/12/2022   Imaging 03/18/24 MRI L spine  IMPRESSION: Mild degenerative disc desiccation and moderate multilevel facet arthrosis. There is caudal foraminal narrowing most notably at L5-S1, left greater than right. Correlation left L5 radiculopathy.   No acute abnormality.  Assessment/Plan: # HIV - continue Biktarvy   - labs today  - fu in 5 months    # STD screening  - Urine GC and RPR  # Hyperlipidemia/Morbid Obesity/Prediabetes   - continue atorvastatin  10 mg po daily  - follow healthy diet including less sugary/fatty/fried food, more fruits, vegetables and regular exercise approx 150 mins of moderate intensity exercise every week   # Chronic LBP, b/l knee and hip pian - Fu with Sports  medicine/Neurosurgery as needed   # Immunization  COVID  Influenza 01/12/24 Monkeypox Pneumococcal PCV 13 09/2018, Pneumococcal PPV 09/2003 and  PCV 20 01/12/24 Meningococcal Menveo 01/2017 and 09/2018 Hep A ab total reactive  Hep B surface ab non reactive; HBV vaccine # 1 today  Tdap 02/12/2022 Zoster  # Health maintenance Hep C ab negative 2015 Hep B surface ag negative 05/2014 02/02/24 Papsmear negative  01/28/24 mammogram negative, next due in a year Colon ca screening - has been discussed, per PCP Dental Care has been discussed   Patient's labs were reviewed as well as his previous records. Patients questions  were addressed and answered. Safe sex counseling done.  I spent 27 minutes involved in face-to-face and non-face-to-face activities for this patient on the day of the visit. Professional time spent includes the following activities: Preparing to see the patient (review of tests), Obtaining and reviewing separately obtained history (last notes from sports medicine and neurosurgery), Performing a medically appropriate examination and evaluation , Ordering labs and vaccine, Documenting clinical information in the EMR, Independently interpreting results (not separately reported), Communicating results to the patient, Counseling and educating the patient.  Electronically signed by:  Annalee Orem, MD Infectious Disease Physician [facility] [address] [place], [place] [address] Phone: [phone]  Fax: [phone]

## 2024-06-15 LAB — T-HELPER CELLS (CD4) COUNT (NOT AT ARMC)
CD4 % Helper T Cell: 44 % (ref 33–65)
CD4 T Cell Abs: 824 /uL (ref 400–1790)

## 2024-06-15 LAB — URINE CYTOLOGY ANCILLARY ONLY
Chlamydia: NEGATIVE
Comment: NEGATIVE
Comment: NORMAL
Neisseria Gonorrhea: NEGATIVE

## 2024-06-17 ENCOUNTER — Ambulatory Visit: Payer: Self-pay | Admitting: Infectious Diseases

## 2024-06-17 LAB — COMPREHENSIVE METABOLIC PANEL WITH GFR
AG Ratio: 1.1 (calc) (ref 1.0–2.5)
ALT: 23 U/L (ref 6–29)
AST: 18 U/L (ref 10–35)
Albumin: 4.1 g/dL (ref 3.6–5.1)
Alkaline phosphatase (APISO): 56 U/L (ref 31–125)
BUN: 13 mg/dL (ref 7–25)
CO2: 25 mmol/L (ref 20–32)
Calcium: 8.7 mg/dL (ref 8.6–10.2)
Chloride: 104 mmol/L (ref 98–110)
Creat: 0.62 mg/dL (ref 0.50–0.99)
Globulin: 3.6 g/dL (ref 1.9–3.7)
Glucose, Bld: 106 mg/dL — ABNORMAL HIGH (ref 65–99)
Potassium: 3.9 mmol/L (ref 3.5–5.3)
Sodium: 136 mmol/L (ref 135–146)
Total Bilirubin: 0.5 mg/dL (ref 0.2–1.2)
Total Protein: 7.7 g/dL (ref 6.1–8.1)
eGFR: 111 mL/min/1.73m2 (ref >=60)

## 2024-06-17 LAB — HIV RNA, RTPCR W/R GT (RTI, PI,INT)
HIV 1 RNA Quant: <20 {copies}/mL — AB
HIV-1 RNA Quant, Log: <1.3 {Log_copies}/mL — AB

## 2024-06-17 LAB — RPR: RPR Ser Ql: NONREACTIVE

## 2024-06-28 ENCOUNTER — Other Ambulatory Visit: Payer: Self-pay

## 2024-06-28 ENCOUNTER — Ambulatory Visit (INDEPENDENT_AMBULATORY_CARE_PROVIDER_SITE_OTHER): Payer: Self-pay | Admitting: Sports Medicine

## 2024-06-28 ENCOUNTER — Ambulatory Visit: Payer: Self-pay | Admitting: Sports Medicine

## 2024-06-28 ENCOUNTER — Ambulatory Visit: Payer: Self-pay

## 2024-06-28 VITALS — BP 120/80 | HR 74 | Ht 62.0 in | Wt 258.0 lb

## 2024-06-28 DIAGNOSIS — M51362 Other intervertebral disc degeneration, lumbar region with discogenic back pain and lower extremity pain: Secondary | ICD-10-CM

## 2024-06-28 DIAGNOSIS — M25551 Pain in right hip: Secondary | ICD-10-CM

## 2024-06-28 DIAGNOSIS — M5441 Lumbago with sciatica, right side: Secondary | ICD-10-CM

## 2024-06-28 DIAGNOSIS — M25552 Pain in left hip: Secondary | ICD-10-CM

## 2024-06-28 DIAGNOSIS — G8929 Other chronic pain: Secondary | ICD-10-CM

## 2024-06-28 DIAGNOSIS — M533 Sacrococcygeal disorders, not elsewhere classified: Secondary | ICD-10-CM

## 2024-06-28 NOTE — Patient Instructions (Addendum)
 Thank you for coming in today.   You received an injection today. Seek immediate medical attention if the area becomes red, extremely painful, or is oozing fluid.   Check back in 3 weeks

## 2024-06-28 NOTE — Progress Notes (Signed)
 Ben Lashundra Shiveley D.[name] [address] [place] [address] Phone: [phone]   Assessment and Plan:     1. Chronic bilateral low back pain with right-sided sciatica 2. Chronic right SI joint pain 3. Degeneration of intervertebral disc of lumbar region with discogenic back pain and lower extremity pain 4. Bilateral hip pain  -Chronic with exacerbation, subsequent visit - Patient continues to experience bilateral hip pain with pain radiating into bilateral thighs.  Differential continues to include lumbar pathology with radiculopathy, SI joint pain - Patient had no relief after intra-articular hip CSI, and only mild degenerative changes of femoral acetabular joint seen on x-ray.  Based on mild degenerative changes and no improvement with intra-articular CSI, I do not believe that femoral acetabular joint represents a primary pain generator for patient - Patient elected for SI joint CSI.  Tolerated well per note below.  If patient receives relief from injection, we could consider SI joint as a primary pain generator, but if no pain relief, would recommend further evaluation of lumbar spine - Use Tylenol  500 to 1000 mg tablets 2-3 times a day for day-to-day pain relief - Use meloxicam  15 mg daily as needed for pain.  Recommend limiting chronic NSAIDs to 1-2 doses per week to prevent long-term side effects. - No significant, prolonged relief with prior courses of meloxicam , prednisone , HEP, physical therapy, epidural CSI x 2.  Patient did have 40 to 50% relief after epidural, however pain and radicular symptoms quickly returned within several weeks  Procedure: Ultrasound Guided Sacroiliac Joint Injection  Side: Right Diagnosis: Back and posterior hip pain US  Indication:  - accuracy is paramount for diagnosis - to ensure therapeutic efficacy or procedural success - to reduce procedural risk  After explaining the procedure, viable alternatives, risks,  and answering any questions, consent was given verbally. The site was cleaned with chlorhexidine prep. An ultrasound transducer was placed on the lumbosacral spine.  The spinous processes were identified. These were followed down from the lumbar spine to the scarum and the SI joint was identified as were the neural foramen.  A needle was introduced with care taken to avoid the neural foramen under ultrasound guidance into the SI joint with sterile technique.    A steroid injection was performed using 2ml of 1% lidocaine  without epinephrine and 40 mg of triamcinolone (KENALOG) 40mg /ml. This was well tolerated .  Needle was removed and dressing placed and post injection instructions were given including  a discussion of likely return of pain today after the anesthetic wears off (with the possibility of worsened pain) until the steroid starts to work in 1-3 days.   Pt was advised to call or return to clinic if these symptoms worsen or fail to improve as anticipated. Images permanently stored.   Pertinent previous records reviewed include none  Follow Up: 3 weeks for reevaluation.  If patient received relief from SI joint CSI, could consider left-sided SI joint CSI.  If no relief in symptoms, would recommend returning to neurosurgery to discuss additional options for low back pain, lumbar radiculopathy, foraminal stenosis, facet arthrosis   Subjective:    Chief Complaint: Bilateral leg and back pain  HPI:   12/29/2023 Patient is a 48 year old female with bilat leg and bakl  pain. Patient states pain for about 2 years. No MOI. Pain starts at the neck and goes down her low back to her hip , groin and then the knee. Tylenol  with codein for the  pain . Didn't help with the pain made her sleepy. No numbness and tingling. Just pain. She has a hard time standing up straight, antalgic gait. Constant pain. 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 She is currently moving . Pain is in her groin and knees    02/29/2024 Patient states steroids didn't help at all    03/22/2024 Patient states that pain is 5/10 today. Have not seen any improvement really.    04/12/2024 Patient states a little bit of pain in the legs today. Seen some improvement.    05/09/2024 Patient states she still has intermittent pain in the legs and groin. Flares usually start about 4 hours after starting work. Would like a refill of meloxicam     06/07/2024 Patient states still has pain in the groin and down the legs   06/28/24 Patient states doing good today. Just a little bit of pain in the groin and down the leg.  Patient felt no improvement after intra-articular hip injection.   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   Current Outpatient Medications:    acetaminophen -codeine  (TYLENOL  #3) 300-30 MG tablet, Take 1-2 tablets by mouth every 8 (eight) hours as needed for moderate pain (pain score 4-6)., Disp: 60 tablet, Rfl: 0   atorvastatin  (LIPITOR) 10 MG tablet, Take 1 tablet (10 mg total) by mouth daily., Disp: 30 tablet, Rfl: 5   bictegravir-emtricitabine-tenofovir  AF (BIKTARVY ) 50-200-25 MG TABS tablet, Take 1 tablet by mouth daily., Disp: 30 tablet, Rfl: 11   meloxicam  (MOBIC ) 15 MG tablet, Take 1 tablet (15 mg total) by mouth daily as needed for pain., Disp: 30 tablet, Rfl: 0   Objective:     Vitals:   06/28/24 1029  BP: 120/80  Pulse: 74  SpO2: 97%  Weight: 258 lb (117 kg)  Height: 5' 2 (1.575 m)      Body mass index is 47.19 kg/m.    Physical Exam:    Gen: Appears well, nad, nontoxic and pleasant Psych: Alert and oriented, appropriate mood and affect Neuro: sensation intact, strength is 5/5 in upper and lower extremities, muscle tone wnl Skin: no  susupicious lesions or rashes   Back - Normal skin, Spine with normal alignment and no deformity.   No tenderness to vertebral process palpation.   Bilateral paraspinous muscles are   tender and without spasm NTTP gluteal musculature Straight leg raise reproduced low back and leg pain, though not typical radicular symptoms Trendelenberg painful bilaterally Unable to perform piriformis Test due to pain with movement Positive FABER and FADIR for anterior and lateral and posterior hip pain, all worse on right compared to left Gait normal    Electronically signed by:  Odis Mace D[name] 10:57 AM 06/28/24

## 2024-07-06 ENCOUNTER — Other Ambulatory Visit: Payer: Self-pay | Admitting: Infectious Diseases

## 2024-07-06 DIAGNOSIS — B20 Human immunodeficiency virus [HIV] disease: Secondary | ICD-10-CM

## 2024-07-12 ENCOUNTER — Ambulatory Visit: Payer: Self-pay

## 2024-07-12 ENCOUNTER — Other Ambulatory Visit: Payer: Self-pay

## 2024-07-18 NOTE — Progress Notes (Unsigned)
 Ben Jackson D.[name] [address] [place] [address] Phone: [phone]   Assessment and Plan:     1. Chronic bilateral low back pain with bilateral sciatica (Primary) 2. Chronic right SI joint pain 3. Chronic left SI joint pain 4. Degeneration of intervertebral disc of lumbar region with discogenic back pain and lower extremity pain 5. Bilateral hip pain -Chronic with exacerbation, subsequent visit - Patient had 50% improvement in symptoms after right SI joint CSI, however relief only lasted for 2 weeks with all pain returning over the past 2 weeks.  Patient continues to experience back pain radiating into bilateral hips, bilateral thighs.  Differential includes lumbar pathology with radiculopathy, SI joint pain - Patient previously had no relief after intra-articular hip CSI, and only mild and short-term relief after epidural CSI x 2.  No significant long-term relief with meloxicam  courses, prednisone  courses, HEP, physical therapy. - Patient elected for repeat right SI joint CSI and initial left SI joint CSI.  Tolerated well per note below - Use Tylenol  500 to 1000 mg tablets 2-3 times a day for day-to-day pain relief - Use meloxicam  15 mg daily as needed for pain.  Recommend limiting chronic NSAIDs to 1-2 doses per week to prevent long-term side effects.  Procedure: Ultrasound Guided Sacroiliac Joint Injection  Side: Bilateral Diagnosis: SI joint pain US  Indication:  - accuracy is paramount for diagnosis - to ensure therapeutic efficacy or procedural success - to reduce procedural risk  After explaining the procedure, viable alternatives, risks, and answering any questions, consent was given verbally. The site was cleaned with chlorhexidine prep. An ultrasound transducer was placed on the lumbosacral spine.  The spinous processes were identified. These were followed down from the lumbar spine to the scarum and the SI joint was  identified as were the neural foramen.  A needle was introduced with care taken to avoid the neural foramen under ultrasound guidance into the SI joint with sterile technique.    A steroid injection was performed using 2ml of 1% lidocaine  without epinephrine and 40 mg of triamcinolone (KENALOG) 40mg /ml. This was well tolerated and resulted in  relief.  Needle was removed and dressing placed and post injection instructions were given including  a discussion of likely return of pain today after the anesthetic wears off (with the possibility of worsened pain) until the steroid starts to work in 1-3 days.  Procedure was repeated on contralateral side.  Pt was advised to call or return to clinic if these symptoms worsen or fail to improve as anticipated. Images permanently stored.     Pertinent previous records reviewed include none   Follow Up: 4 weeks for reevaluation.  If no improvement or worsening of symptoms, would recommend returning to neurosurgery to discuss additional options which could include treatment for low back pain, lumbar radiculopathy, foraminal stenosis, facet arthrosis   Subjective:   I, Chestine Reeves, am serving as a Neurosurgeon for Doctor [employer]  Chief Complaint: Bilateral leg and back pain   HPI:    12/29/2023 Patient is a 48 year old female with bilat leg and bakl  pain. Patient states pain for about 2 years. No MOI. Pain starts at the neck and goes down her low back to her hip , groin and then the knee. Tylenol  with codein for the pain . Didn't help with the pain made her sleepy. No numbness and tingling. Just pain. She has a hard time standing up straight, antalgic gait.  Constant pain. 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 She is currently moving . Pain is in her groin and knees    02/29/2024 Patient states steroids didn't help at all     03/22/2024 Patient states that pain is 5/10 today. Have not seen any improvement really.    04/12/2024 Patient states a little bit of pain in the legs today. Seen some improvement.    05/09/2024 Patient states she still has intermittent pain in the legs and groin. Flares usually start about 4 hours after starting work. Would like a refill of meloxicam     06/07/2024 Patient states still has pain in the groin and down the legs    06/28/24 Patient states doing good today. Just a little bit of pain in the groin and down the leg.  Patient felt no improvement after intra-articular hip injection.  07/19/2024 Patient states epidural only lasted about 2 weeks. 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   Current Outpatient Medications:    acetaminophen -codeine  (TYLENOL  #3) 300-30 MG tablet, Take 1-2 tablets by mouth every 8 (eight) hours as needed for moderate pain (pain score 4-6)., Disp: 60 tablet, Rfl: 0   atorvastatin  (LIPITOR) 10 MG tablet, Take 1 tablet (10 mg total) by mouth daily., Disp: 30 tablet, Rfl: 5   bictegravir-emtricitabine-tenofovir  AF (BIKTARVY ) 50-200-25 MG TABS tablet, Take 1 tablet by mouth daily., Disp: 30 tablet, Rfl: 11   meloxicam  (MOBIC ) 15 MG tablet, Take 1 tablet (15 mg total) by mouth daily as needed for pain., Disp: 30 tablet, Rfl: 0   Objective:     Vitals:   07/19/24 1433  Pulse: (!) 102  SpO2: 99%  Weight: 256 lb (116.1 kg)  Height: 5' 2 (1.575 m)      Body mass index is 46.82 kg/m.    Physical Exam:    Gen: Appears well, nad, nontoxic and pleasant Psych: Alert and oriented, appropriate mood and affect Neuro: sensation intact, strength is 5/5 in upper and lower extremities, muscle tone wnl Skin: no susupicious lesions or rashes   Back - Normal skin, Spine with  normal alignment and no deformity.   No tenderness to vertebral process palpation.   Bilateral paraspinous muscles are   tender and without spasm NTTP gluteal musculature Straight leg raise reproduced low back and leg pain, though not typical radicular symptoms Trendelenberg painful bilaterally Unable to perform piriformis Test due to pain with movement Positive FABER and FADIR for anterior and lateral and posterior hip pain, all worse on right compared to left Gait normal    Electronically signed by:  Odis Mace [name] 2:49 PM 07/19/24

## 2024-07-19 ENCOUNTER — Other Ambulatory Visit: Payer: Self-pay

## 2024-07-19 ENCOUNTER — Ambulatory Visit (INDEPENDENT_AMBULATORY_CARE_PROVIDER_SITE_OTHER): Payer: Self-pay | Admitting: Sports Medicine

## 2024-07-19 VITALS — HR 102 | Ht 62.0 in | Wt 256.0 lb

## 2024-07-19 DIAGNOSIS — M25552 Pain in left hip: Secondary | ICD-10-CM

## 2024-07-19 DIAGNOSIS — M533 Sacrococcygeal disorders, not elsewhere classified: Secondary | ICD-10-CM

## 2024-07-19 DIAGNOSIS — M5441 Lumbago with sciatica, right side: Secondary | ICD-10-CM

## 2024-07-19 DIAGNOSIS — M25551 Pain in right hip: Secondary | ICD-10-CM

## 2024-07-19 DIAGNOSIS — G8929 Other chronic pain: Secondary | ICD-10-CM

## 2024-07-19 DIAGNOSIS — M51362 Other intervertebral disc degeneration, lumbar region with discogenic back pain and lower extremity pain: Secondary | ICD-10-CM

## 2024-07-19 DIAGNOSIS — M5442 Lumbago with sciatica, left side: Secondary | ICD-10-CM

## 2024-07-19 NOTE — Patient Instructions (Signed)
 4 week follow up   - Use meloxicam  15 mg daily as needed for pain.  Recommend limiting chronic NSAIDs to 1-2 doses per week to prevent long-term side effects.

## 2024-08-15 NOTE — Progress Notes (Unsigned)
 Ben Jackson D.[name] [address] [place] [address] Phone: [phone]   Assessment and Plan:    1. Chronic bilateral low back pain with bilateral sciatica (Primary) 2. 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  Most consistent with lumbar radiculopathy due to DDD and facet arthrosis with caudal foraminal narrowing most prominent at L5-S1 as seen on lumbar MRI 03/17/2024 - Patient has had no significant improvement despite epidural CSI x 2, intra-articular hip CSI, SI joint CSI x 2.  Pain is not consistent with meralgia paresthetica.  Pain is not consistent with intra-articular hip pathology.  Pain is not consistent with SI joint pathology. - Patient previously established with neurosurgery who recommended further evaluation of hip pathology versus SI joint pathology to see if these could be contributing to her symptoms.  As patient has seen no improvement despite additional treatment of these areas, I recommend reevaluation by neurosurgery.    patient would prefer to see a different neurosurgeon.  Referral sent  - Use meloxicam  15 mg daily as needed for breakthrough pain.  Recommend limiting chronic NSAIDs to 1-2 doses per week to prevent long-term side effects. Use Tylenol  500 to 1000 mg tablets 2-3 times a day as needed for day-to-day pain relief.     Pertinent previous records reviewed include none   Follow Up: As needed   Subjective:   I, Nic Lampe, am serving as a Neurosurgeon for Doctor Morene Mace   Chief Complaint: Bilateral leg and back pain   HPI:    12/29/2023 Patient is a 48 year old female with bilat leg and bakl  pain. Patient states pain for  about 2 years. No MOI. Pain starts at the neck and goes down her low back to her hip , groin and then the knee. Tylenol  with codein for the pain . Didn't help with the pain made her sleepy. No numbness and tingling. Just pain. She has a hard time standing up straight, antalgic gait. Constant pain. 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 She is currently moving . Pain is in her groin and knees    02/29/2024 Patient states steroids didn't help at all    03/22/2024 Patient states that pain is 5/10 today. Have not seen any improvement really.    04/12/2024 Patient states a little bit of pain in the legs today. Seen some improvement.    05/09/2024 Patient states she still has intermittent pain in the legs and groin. Flares usually start about 4 hours after starting work. Would like a refill of meloxicam     06/07/2024 Patient states still has pain in the groin and down the legs    06/28/24 Patient states doing good today. Just a little bit of pain in the groin and down the leg.  Patient felt no improvement after intra-articular hip injection.   07/19/2024 Patient states epidural only lasted about 2 weeks. Past couple of days pain has started to come back   08/16/2024 Patient states she has intermittent pain    Relevant Historical Information: History of DVT in left leg  with recent completion of 109-month course of anticoagulation, HIV,    Additional pertinent review of systems negative.   Current Outpatient Medications:    acetaminophen -codeine  (TYLENOL  #3) 300-30 MG tablet, Take 1-2 tablets by mouth every 8 (eight) hours as needed for moderate pain (pain score 4-6)., Disp: 60 tablet, Rfl: 0   atorvastatin  (LIPITOR) 10 MG tablet, Take 1 tablet (10 mg total) by mouth daily., Disp: 30 tablet, Rfl: 5   bictegravir-emtricitabine-tenofovir  AF (BIKTARVY ) 50-200-25 MG TABS tablet, Take 1  tablet by mouth daily., Disp: 30 tablet, Rfl: 11   meloxicam  (MOBIC ) 15 MG tablet, Take 1 tablet (15 mg total) by mouth daily as needed for pain., Disp: 30 tablet, Rfl: 0   Objective:     Vitals:   08/16/24 1424  Pulse: 84  SpO2: 98%  Weight: 257 lb (116.6 kg)  Height: 5' 2 (1.575 m)      Body mass index is 47.01 kg/m.    Physical Exam:    Gen: Appears well, nad, nontoxic and pleasant Psych: Alert and oriented, appropriate mood and affect Neuro: sensation intact, strength is 5/5 in upper and lower extremities, muscle tone wnl Skin: no susupicious lesions or rashes   Back - Normal skin, Spine with normal alignment and no deformity.   No tenderness to vertebral process palpation.   Bilateral paraspinous muscles are   tender and without spasm NTTP gluteal musculature Straight leg raise reproduced low back and leg pain, though not typical radicular symptoms Trendelenberg painful bilaterally Unable to perform piriformis Test due to pain with movement Negative FABER and FADIR   Gait normal    Electronically signed by:  Odis Mace [name] 2:40 PM 08/16/24

## 2024-08-16 ENCOUNTER — Ambulatory Visit (INDEPENDENT_AMBULATORY_CARE_PROVIDER_SITE_OTHER): Payer: Self-pay | Admitting: Sports Medicine

## 2024-08-16 VITALS — HR 84 | Ht 62.0 in | Wt 257.0 lb

## 2024-08-16 DIAGNOSIS — M51362 Other intervertebral disc degeneration, lumbar region with discogenic back pain and lower extremity pain: Secondary | ICD-10-CM

## 2024-08-16 DIAGNOSIS — G8929 Other chronic pain: Secondary | ICD-10-CM

## 2024-08-16 NOTE — Patient Instructions (Addendum)
Neurosurgery referral  As needed follow up

## 2024-09-06 ENCOUNTER — Other Ambulatory Visit: Payer: Self-pay

## 2024-09-06 ENCOUNTER — Ambulatory Visit: Payer: Self-pay

## 2024-09-06 ENCOUNTER — Encounter: Payer: Self-pay | Admitting: Physician Assistant

## 2024-09-06 ENCOUNTER — Other Ambulatory Visit: Payer: Self-pay | Admitting: Physician Assistant

## 2024-09-06 ENCOUNTER — Ambulatory Visit (INDEPENDENT_AMBULATORY_CARE_PROVIDER_SITE_OTHER): Payer: Self-pay | Admitting: Physician Assistant

## 2024-09-06 VITALS — BP 134/100 | Ht 61.0 in | Wt 257.0 lb

## 2024-09-06 DIAGNOSIS — M5010 Cervical disc disorder with radiculopathy, unspecified cervical region: Secondary | ICD-10-CM

## 2024-09-06 DIAGNOSIS — M542 Cervicalgia: Secondary | ICD-10-CM

## 2024-09-06 DIAGNOSIS — M25551 Pain in right hip: Secondary | ICD-10-CM

## 2024-09-06 DIAGNOSIS — M25552 Pain in left hip: Secondary | ICD-10-CM

## 2024-09-06 DIAGNOSIS — M545 Low back pain, unspecified: Secondary | ICD-10-CM

## 2024-09-06 MED ORDER — METHYLPREDNISOLONE 4 MG PO TBPK
ORAL_TABLET | ORAL | 0 refills | Status: AC
  Filled 2024-09-06: qty 21, 6d supply, fill #0

## 2024-09-06 NOTE — Progress Notes (Signed)
 Referring Physician:  Leonce Katz, DO [address] [place],  [place] [address]  Primary Physician:  Theotis Haze ORN, NP  History of Present Illness: 09/06/2024 [name] is here today for follow-up on right sided low back/right hip pain.  She has pain in her low back/right hip area that goes down the front of her leg to the level of her knee as well as into her groin.  When she is standing for prolonged portion of time or is up and walking her pain increases.  In addition, she has some neck pain primarily between her shoulder blades of the right side.  She does not have any numbness or tingling that radiates down her arms or legs.  Denies any saddle anesthesia or incontinence.       Patient has had no significant improvement despite epidural CSI x 2, intra-articular hip CSI, SI joint CSI x 2.  Bowel/Bladder Dysfunction: none  Conservative measures:  Physical therapy: has participated in PT with [facility]  Multimodal medical therapy including regular antiinflammatories:  Tylenol , Meloxicam , Prednisone , Tylenol  with Codeine , Diclofenac  Injections:  03/30/2024-ESI right L5-S1  04/22/2024-CSI right L5-S1   Past Surgery: no spine surgery  Kathleen Savage has no symptoms of cervical myelopathy.  The symptoms are causing a significant impact on the patient's life.   I have utilized the care everywhere function in epic to review the outside records available from external health systems.  Review of Systems:  A 10 point review of systems is negative, except for the pertinent positives and negatives detailed in the HPI.  Past Medical History: Past Medical History:  Diagnosis Date   Glucose intolerance (pre-diabetes)    HIV infection (HCC) 2005   Hyperlipidemia     Past Surgical History: Past Surgical History:  Procedure Laterality Date   LOWER EXTREMITY VENOGRAPHY Left 05/12/2023   Procedure: LOWER EXTREMITY VENOGRAPHY;  Surgeon: Serene Gaile ORN, MD;   [facility];  Service: Cardiovascular;  Laterality: Left;   PERIPHERAL VASCULAR BALLOON ANGIOPLASTY  05/12/2023   Procedure: PERIPHERAL VASCULAR BALLOON ANGIOPLASTY;  Surgeon: Serene Gaile ORN, MD;  Location: [facility];  Service: Cardiovascular;;   PERIPHERAL VASCULAR THROMBECTOMY N/A 05/12/2023   Procedure: MECHANICAL THROMBECTOMY;  Surgeon: Serene Gaile ORN, MD;  Location: [facility];  Service: Cardiovascular;  Laterality: N/A;    Allergies: Allergies as of 09/06/2024   (No Known Allergies)    Medications:  Current Outpatient Medications:    acetaminophen -codeine  (TYLENOL  #3) 300-30 MG tablet, Take 1-2 tablets by mouth every 8 (eight) hours as needed for moderate pain (pain score 4-6)., Disp: 60 tablet, Rfl: 0   atorvastatin  (LIPITOR) 10 MG tablet, Take 1 tablet (10 mg total) by mouth daily., Disp: 30 tablet, Rfl: 5   bictegravir-emtricitabine-tenofovir  AF (BIKTARVY ) 50-200-25 MG TABS tablet, Take 1 tablet by mouth daily., Disp: 30 tablet, Rfl: 11   meloxicam  (MOBIC ) 15 MG tablet, Take 1 tablet (15 mg total) by mouth daily as needed for pain., Disp: 30 tablet, Rfl: 0  Social History: Social History   Tobacco Use   Smoking status: Never   Smokeless tobacco: Never  Vaping Use   Vaping status: Never Used  Substance Use Topics   Alcohol use: Yes    Alcohol/week: 3.0 standard drinks of alcohol    Types: 1 Glasses of wine, 1 Cans of beer, 1 Shots of liquor per week    Comment: occasional   Drug use: No    Family Medical History:  Family History  Problem Relation Age of Onset   Cancer Maternal Aunt        Breast Cancer   Breast cancer Maternal Aunt    Cancer Cousin        Breast cancer - maternal cousin   Breast cancer Cousin    Cancer Cousin        breast cancer   Breast cancer Cousin    Cancer Cousin        breast cancer   Cancer Maternal Aunt        stomach cancer   Breast cancer Maternal Aunt    Hypertension Mother    Stroke Father     Cancer Sister    Uterine cancer Sister     Physical Examination: Vitals:   09/06/24 1110  BP: (!) 134/100    General: Patient is in no apparent distress. Attention to examination is appropriate.  Neck:   Supple.  Full range of motion.  Respiratory: Patient is breathing without any difficulty.   NEUROLOGICAL:     Awake, alert, oriented to person, place, and time.  Speech is clear and fluent.   Cranial Nerves: Pupils equal round and reactive to light.  Facial tone is symmetric.  Facial sensation is symmetric. Shoulder shrug is symmetric. Tongue protrusion is midline.  There is no pronator drift.     Strength: Side Biceps Triceps Deltoid Interossei Grip Wrist Ext. Wrist Flex.  R 5 5 5 5 5 5 5   L 5 5 5 5 5 5 5    Side Iliopsoas Quads Hamstring PF DF EHL  R 4+ 5 5 5 5 5   L 5 5 5 5 5 5   1+ patellar reflex bilaterally, trace Achilles reflex. Significant tenderness to palpation of right trochanteric bursa. Significant pain with internal and external rotation of the hip, right worse than left.  Hoffman's is absent.   Bilateral upper and lower extremity sensation is intact to light touch.    No evidence of dysmetria noted.  Gait is normal.     Medical Decision Making  Imaging: MRI L spine 03/17/2024 FINDINGS: There is normal alignment of the lumbar spine. Mild degenerative disc this case and mild to moderate facet arthrosis is present. There is no vertebral body height loss, subluxation or marrow replacing process. The sacrum and SI joints are unremarkable so far as visualized. Conus and cauda equina are unremarkable.   T12-L1: There is a mild broad-based disc osteophyte mild facet arthrosis. No significant foraminal or spinal stenosis is present.   L1-2: Mild broad-based disc osteophyte facet arthrosis. No significant foraminal or spinal stenosis   L2-3: Disc desiccation and mild/moderate facet arthrosis. No significant foraminal or spinal stenosis.   L3-4: Mild  broad-based bulge and mild-to-moderate facet arthrosis. No significant foraminal or spinal stenosis.   L4-5: Mild broad-based bulge and moderate facet arthrosis. There is slight effacement of the ventral thecal sac. There is no significant foraminal or spinal stenosis.   L5-S1: Mild broad-based disc osteophyte moderate facet arthrosis. There is effacement of the thecal sac slightly crowding the descending nerve roots in the lateral recess. There is moderate bilateral foraminal narrowing, left greater than right. Correlation for mild L5 radiculopathy.   The retroperitoneal structures demonstrate no significant abnormality.   IMPRESSION: Mild degenerative disc desiccation and moderate multilevel facet arthrosis. There is caudal foraminal narrowing most notably at L5-S1, left greater than right. Correlation left L5 radiculopathy.   No acute abnormality.   Electronically signed by: Norleen Satchel MD 03/18/2024 04:42  PM EDT RP Workstation: [id number]  I have personally reviewed the images and agree with the above interpretation.  Assessment and Plan: Ms. Parzych is a pleasant 48 y.o. female with right low back/right hip pain.  She has been seen by sports medicine who is concerned that this is primarily due to her lumbar spine.  However, after discussion and examination of the patient I do think primary source of pain is hip pathology.  She has significant tenderness to her trochanteric bursa.  She has significant pain with internal and external rotation of her hip.  The pain goes to her anterior thigh and into her groin.  This does not correlate with her MRI of her lumbar spine which does not show any significant stenosis, especially at the level of L3.  Her MRI would correlate with pain down the back of her left leg which she states is not present.  I do think this is less likely to be meralgia paresthetica/lateral femoral cutaneous neuropathy due to the lack of numbness and tingling in  her anterior lateral thigh.  In regards to her neck.  She is having some acute neck pain.  Potentially has a radiculopathy.  At this point however, hip is patient's biggest concern.  Plan includes plan: - Patient has already undergone physical therapy with little relief - Plan for second opinion from orthopedic surgery to discuss her hip - X-rays of her right hip and pelvis to be done in clinic today. - Medrol  Dosepak given for pain for potential cervical radiculopathy.  Also obtain cervical x-rays today and will review once complete. - Plan to check back in approximate 6 weeks.     Thank you for involving me in the care of this patient.    Lyle Decamp, PA-C Neurosurgery

## 2024-10-05 ENCOUNTER — Other Ambulatory Visit: Payer: Self-pay

## 2024-10-05 ENCOUNTER — Ambulatory Visit (INDEPENDENT_AMBULATORY_CARE_PROVIDER_SITE_OTHER): Payer: Self-pay | Admitting: Orthopaedic Surgery

## 2024-10-05 ENCOUNTER — Encounter: Payer: Self-pay | Admitting: Orthopaedic Surgery

## 2024-10-05 VITALS — Ht 61.0 in | Wt 257.0 lb

## 2024-10-05 DIAGNOSIS — M25552 Pain in left hip: Secondary | ICD-10-CM

## 2024-10-05 DIAGNOSIS — M25551 Pain in right hip: Secondary | ICD-10-CM

## 2024-10-05 NOTE — Progress Notes (Signed)
 The patient is a 48 year old that I am seeing for the first time.  She sent from her primary care provider to evaluate bilateral hip pain.  She has actually seen neurosurgery with Dr. Reeves Nine has been a regular patient of Dr. Morene Mace from a sports medicine standpoint.  Neurosurgery felt that it was more of a hip issue.  The right hip hurts worse than the left.  There are x-rays in the canopy system of her pelvis and both hips for me to review as well.  She is not a diabetic.  She has been to a weight loss specialist but that has been expensive.  Her BMI today is 48.56.  On exam both hips move smoothly and fully with no blocks to rotation but significant stiffness on the right hip comparing the right and left hips but they are both painful in the groin and some around the trochanteric area and the low back.  X-rays of both hips and pelvis were reviewed today.  The right hip space does show some superior lateral narrowing but is not bone-on-bone however it is more arthritic compared to the left side.  The left hip appears more normal on x-ray.  I talked her about her hips in detail and did discuss again the importance of weight loss.  It is absolutely worth setting her up for fluoroscopic guided steroid injections in both hip joints by Dr. Eldonna to see if this will help from a therapeutic standpoint but also a diagnostic standpoint.  She has had injections in her lumbar spine.  She said she is not opposed to trying this for her hips.  We will work on setting that up and would up with Dr. Eldonna and then he can get her back to us  2 to 3 weeks after those injections.  All questions and concerns were addressed and answered.

## 2024-10-19 ENCOUNTER — Other Ambulatory Visit: Payer: Self-pay | Admitting: Nurse Practitioner

## 2024-10-19 DIAGNOSIS — G8929 Other chronic pain: Secondary | ICD-10-CM

## 2024-10-22 MED ORDER — ACETAMINOPHEN-CODEINE 300-30 MG PO TABS
1.0000 | ORAL_TABLET | Freq: Three times a day (TID) | ORAL | 0 refills | Status: AC | PRN
  Filled 2024-10-22: qty 60, 10d supply, fill #0

## 2024-10-24 ENCOUNTER — Other Ambulatory Visit: Payer: Self-pay

## 2024-10-25 ENCOUNTER — Encounter: Payer: Self-pay | Admitting: Nurse Practitioner

## 2024-10-25 ENCOUNTER — Ambulatory Visit: Payer: Self-pay | Admitting: Physician Assistant

## 2024-10-25 ENCOUNTER — Other Ambulatory Visit: Payer: Self-pay

## 2024-10-25 ENCOUNTER — Ambulatory Visit: Payer: Self-pay | Attending: Nurse Practitioner | Admitting: Nurse Practitioner

## 2024-10-25 VITALS — BP 111/77 | HR 64 | Ht 61.0 in | Wt 252.2 lb

## 2024-10-25 DIAGNOSIS — Z23 Encounter for immunization: Secondary | ICD-10-CM

## 2024-10-25 DIAGNOSIS — K921 Melena: Secondary | ICD-10-CM

## 2024-10-25 DIAGNOSIS — M2550 Pain in unspecified joint: Secondary | ICD-10-CM

## 2024-10-25 DIAGNOSIS — M25551 Pain in right hip: Secondary | ICD-10-CM

## 2024-10-25 DIAGNOSIS — Z1211 Encounter for screening for malignant neoplasm of colon: Secondary | ICD-10-CM

## 2024-10-25 NOTE — Progress Notes (Signed)
° ° °  Patient is doing well. She was seen by orthopedics and is scheduled for an injection in which she is hopeful will give her some improvement in her pain. She plans to check base with us  regarding this once complete.   This visit was performed via telephone.  Patient location: home Provider location: office  I spent a total of 5 minutes non-face-to-face activities for this visit on the date of this encounter including review of current clinical condition and response to treatment.  The patient is aware of and accepts the limits of this telehealth visit.

## 2024-10-25 NOTE — Progress Notes (Signed)
 Assessment & Plan:  Kathleen Savage was seen today for rectal bleeding.  Diagnoses and all orders for this visit:  Blood in the stool -     Fecal occult blood, imunochemical -     Ambulatory referral to Gastroenterology -     CBC with Differential/Platelet Bright red blood streaking stool for months. Differential includes colon cancer, Crohn's disease, ulcerative colitis - Provided stool kit for home collection and return within 24 hours. - Referred to gastroenterologist for further evaluation. - Advised to inform gastroenterology about lack of insurance for potential patient assistance. - Will discuss potential need for colonoscopy based on stool test results.  Need for influenza vaccination -     Flu vaccine trivalent PF, 6mos and older(Flulaval,Afluria,Fluarix,Fluzone)  Colon cancer screening -     Fecal occult blood, imunochemical -     Ambulatory referral to Gastroenterology  Arthralgia of multiple joints -     Estrogens , Total Continue tylenol  3 as prescribed   Patient has been counseled on age-appropriate routine health concerns for screening and prevention. These are reviewed and up-to-date. Referrals have been placed accordingly. Immunizations are up-to-date or declined.    Subjective:   Chief Complaint  Patient presents with   Rectal Bleeding    First noticed months ago.     Kathleen Savage 48 y.o. female presents to office today with concerns of blood in her stool.  She has experienced blood in her stool for the past few months, characterized by bright red streaks in the stool and red-tinged water in the toilet. There are no previous episodes of hematochezia, and she has not consumed foods or started new medications that could cause this symptom. She experiences occasional constipation but no significant changes in stool consistency. She has intermittent abdominal pain, which she sometimes attributes it to the pain in her legs and hips, making it difficult  to discern the source. No nausea or vomiting is present.  She has significant back pain and has been prescribed Tylenol  3 for relief, which she tries not to take daily. She has been referred to various specialists for her back pain, including a chiropractor and a neurosurgeon, but has not found a resolution. She is scheduled to receive an injection in her hips next week.  She experiences joint pain and mood changes, but reports regular menstrual cycles without skipping periods.  Review of Systems  Constitutional:  Positive for malaise/fatigue. Negative for fever and weight loss.  Respiratory: Negative.  Negative for cough and shortness of breath.   Cardiovascular: Negative.  Negative for chest pain, palpitations and leg swelling.  Gastrointestinal:  Positive for blood in stool. Negative for abdominal pain, constipation, diarrhea, heartburn, melena, nausea and vomiting.  Musculoskeletal:  Positive for joint pain and myalgias.  Neurological: Negative.  Negative for dizziness, focal weakness, seizures and headaches.  Psychiatric/Behavioral:  Positive for depression. Negative for suicidal ideas.     Past Medical History:  Diagnosis Date   Glucose intolerance (pre-diabetes)    HIV infection (HCC) 2005   Hyperlipidemia     Past Surgical History:  Procedure Laterality Date   LOWER EXTREMITY VENOGRAPHY Left 05/12/2023   Procedure: LOWER EXTREMITY VENOGRAPHY;  Surgeon: Serene Gaile ORN, MD;  [facility]: [facility];  Service: Cardiovascular;  Laterality: Left;   PERIPHERAL VASCULAR BALLOON ANGIOPLASTY  05/12/2023   Procedure: PERIPHERAL VASCULAR BALLOON ANGIOPLASTY;  Surgeon: Serene Gaile ORN, MD;  Location: [facility];  Service: Cardiovascular;;   PERIPHERAL VASCULAR THROMBECTOMY N/A 05/12/2023  Procedure: MECHANICAL THROMBECTOMY;  Surgeon: Serene Gaile ORN, MD;  Location: [facility];  Service: Cardiovascular;  Laterality: N/A;    Family History  Problem Relation Age of  Onset   Cancer Maternal Aunt        Breast Cancer   Breast cancer Maternal Aunt    Cancer Cousin        Breast cancer - maternal cousin   Breast cancer Cousin    Cancer Cousin        breast cancer   Breast cancer Cousin    Cancer Cousin        breast cancer   Cancer Maternal Aunt        stomach cancer   Breast cancer Maternal Aunt    Hypertension Mother    Stroke Father    Cancer Sister    Uterine cancer Sister     Social History Reviewed with no changes to be made today.   Outpatient Medications Prior to Visit  Medication Sig Dispense Refill   acetaminophen -codeine  (TYLENOL  #3) 300-30 MG tablet Take 1-2 tablets by mouth every 8 (eight) hours as needed for moderate pain (pain score 4-6). 60 tablet 0   bictegravir-emtricitabine-tenofovir  AF (BIKTARVY ) 50-200-25 MG TABS tablet Take 1 tablet by mouth daily. 30 tablet 11   atorvastatin  (LIPITOR) 10 MG tablet Take 1 tablet (10 mg total) by mouth daily. (Patient not taking: Reported on 10/25/2024) 30 tablet 5   meloxicam  (MOBIC ) 15 MG tablet Take 1 tablet (15 mg total) by mouth daily as needed for pain. (Patient not taking: Reported on 10/25/2024) 30 tablet 0   methylPREDNISolone  (MEDROL  DOSEPAK) 4 MG TBPK tablet Take by mouth daily, taper daily dose per package instructions. (Patient not taking: Reported on 10/25/2024) 21 tablet 0   No facility-administered medications prior to visit.    Allergies[1]     Objective:    BP 111/77 (BP Location: Left Arm, Patient Position: Sitting, Cuff Size: Normal)   Pulse 64   Ht 5' 1 (1.549 m)   Wt 252 lb 3.2 oz (114.4 kg)   LMP 09/25/2024 (Exact Date)   SpO2 98%   BMI 47.65 kg/m  Wt Readings from Last 3 Encounters:  10/25/24 252 lb 3.2 oz (114.4 kg)  10/05/24 257 lb (116.6 kg)  09/06/24 257 lb (116.6 kg)    Physical Exam Vitals and nursing note reviewed.  Constitutional:      Appearance: She is well-developed.  HENT:     Head: Normocephalic and atraumatic.  Cardiovascular:      Rate and Rhythm: Normal rate and regular rhythm.     Heart sounds: Normal heart sounds. No murmur heard.    No friction rub. No gallop.  Pulmonary:     Effort: Pulmonary effort is normal. No tachypnea or respiratory distress.     Breath sounds: Normal breath sounds. No decreased breath sounds, wheezing, rhonchi or rales.  Chest:     Chest wall: No tenderness.  Abdominal:     General: Bowel sounds are normal.     Palpations: Abdomen is soft.  Musculoskeletal:        General: Normal range of motion.     Cervical back: Normal range of motion.  Skin:    General: Skin is warm and dry.  Neurological:     Mental Status: She is alert and oriented to person, place, and time.     Coordination: Coordination normal.  Psychiatric:        Behavior: Behavior normal. Behavior is cooperative.  Thought Content: Thought content normal.        Judgment: Judgment normal.          Patient has been counseled extensively about nutrition and exercise as well as the importance of adherence with medications and regular follow-up. The patient was given clear instructions to go to ER or return to medical center if symptoms don't improve, worsen or new problems develop. The patient verbalized understanding.   Follow-up: Return if symptoms worsen or fail to improve.   Haze LELON Servant, FNP-BC [facility] [place], [place] [phone]   10/25/2024, 11:16 AM    [1] No Known Allergies

## 2024-10-26 LAB — CBC WITH DIFFERENTIAL/PLATELET
Basophils Absolute: 0.1 x10E3/uL (ref 0.0–0.2)
Basos: 1 %
EOS (ABSOLUTE): 0.1 x10E3/uL (ref 0.0–0.4)
Eos: 2 %
Hematocrit: 37 % (ref 34.0–46.6)
Hemoglobin: 12.2 g/dL (ref 11.1–15.9)
Immature Grans (Abs): 0 x10E3/uL (ref 0.0–0.1)
Immature Granulocytes: 0 %
Lymphocytes Absolute: 1.8 x10E3/uL (ref 0.7–3.1)
Lymphs: 25 %
MCH: 30.4 pg (ref 26.6–33.0)
MCHC: 33 g/dL (ref 31.5–35.7)
MCV: 92 fL (ref 79–97)
Monocytes Absolute: 0.4 x10E3/uL (ref 0.1–0.9)
Monocytes: 6 %
Neutrophils Absolute: 4.7 x10E3/uL (ref 1.4–7.0)
Neutrophils: 66 %
Platelets: 363 x10E3/uL (ref 150–450)
RBC: 4.01 x10E6/uL (ref 3.77–5.28)
RDW: 13.4 % (ref 11.7–15.4)
WBC: 7.1 x10E3/uL (ref 3.4–10.8)

## 2024-10-26 LAB — ESTROGENS, TOTAL: Estrogen: 84 pg/mL

## 2024-10-29 ENCOUNTER — Ambulatory Visit: Payer: Self-pay | Admitting: Nurse Practitioner

## 2024-10-31 ENCOUNTER — Other Ambulatory Visit: Payer: Self-pay

## 2024-10-31 ENCOUNTER — Ambulatory Visit: Payer: Self-pay | Admitting: Physical Medicine and Rehabilitation

## 2024-10-31 DIAGNOSIS — M25552 Pain in left hip: Secondary | ICD-10-CM

## 2024-10-31 DIAGNOSIS — M25551 Pain in right hip: Secondary | ICD-10-CM

## 2024-10-31 MED ORDER — TRIAMCINOLONE ACETONIDE 40 MG/ML IJ SUSP
40.0000 mg | INTRAMUSCULAR | Status: AC | PRN
  Administered 2024-10-31: 40 mg via INTRA_ARTICULAR

## 2024-10-31 MED ORDER — BUPIVACAINE HCL 0.5 % IJ SOLN
5.0000 mL | INTRAMUSCULAR | Status: AC | PRN
  Administered 2024-10-31: 5 mL via INTRA_ARTICULAR

## 2024-10-31 NOTE — Progress Notes (Signed)
 Pain Scale   Average Pain 4 Patient advising she has chronic bilateral hip pain that is constant.        +Driver, -BT, -Dye Allergies.

## 2024-10-31 NOTE — Progress Notes (Signed)
" ° °  Kathleen Savage - 48 y.o. female MRN [id number]  Date of birth: [birth year]  Office Visit Note: Visit Date: 10/31/2024 PCP: Theotis Haze ORN, NP Referred by: Theotis Haze ORN, NP  Subjective: Chief Complaint  Patient presents with   Right Hip - Pain   Left Hip - Pain   HPI:  Kathleen Savage is a 48 y.o. female who comes in today at the request of Dr. Lonni Poli for planned Bilateral anesthetic hip arthrogram with fluoroscopic guidance.  The patient has failed conservative care including home exercise, medications, time and activity modification.  This injection will be diagnostic and hopefully therapeutic.  Please see requesting physician notes for further details and justification.   ROS Otherwise per HPI.  Assessment & Plan: Visit Diagnoses:    ICD-10-CM   1. Pain in left hip  M25.552 Large Joint Inj: bilateral hip joint    XR C-ARM NO REPORT    2. Pain in right hip  M25.551 Large Joint Inj: bilateral hip joint    XR C-ARM NO REPORT      Plan: No additional findings.   Meds & Orders: No orders of the defined types were placed in this encounter.   Orders Placed This Encounter  Procedures   Large Joint Inj: bilateral hip joint   XR C-ARM NO REPORT    Follow-up: Return in about 3 weeks (around 11/21/2024) for Lonni Poli, MD.   Procedures: Large Joint Inj: bilateral hip joint on 10/31/2024 3:18 PM Indications: diagnostic evaluation and pain Details: 22 G 3.5 in needle, fluoroscopy-guided anterior approach  Arthrogram: No  Medications (Right): 40 mg triamcinolone  acetonide 40 MG/ML; 5 mL bupivacaine  0.5 % Medications (Left): 40 mg triamcinolone  acetonide 40 MG/ML; 5 mL bupivacaine  0.5 % Outcome: tolerated well, no immediate complications  There was excellent flow of contrast producing a partial arthrogram of the hip. The patient did have relief of symptoms during the anesthetic phase of the injection. Procedure, treatment alternatives, risks  and benefits explained, specific risks discussed. Consent was given by the patient. Immediately prior to procedure a time out was called to verify the correct patient, procedure, equipment, support staff and site/side marked as required. Patient was prepped and draped in the usual sterile fashion.          Clinical History: No specialty comments available.     Objective:  VS:  HT:    WT:   BMI:     BP:   HR: bpm  TEMP: ( )  RESP:  Physical Exam   Imaging: No results found. "

## 2024-11-15 ENCOUNTER — Ambulatory Visit: Payer: Self-pay | Admitting: Infectious Diseases

## 2024-11-16 ENCOUNTER — Encounter: Payer: Self-pay | Admitting: Infectious Diseases

## 2024-11-16 ENCOUNTER — Other Ambulatory Visit: Payer: Self-pay | Admitting: Pharmacist

## 2024-11-16 MED ORDER — BICTEGRAVIR-EMTRICITAB-TENOFOV 50-200-25 MG PO TABS: 1.0000 | ORAL_TABLET | Freq: Every day | ORAL | Status: AC

## 2024-11-16 NOTE — Progress Notes (Signed)
 Medication Samples have been provided to the patient.  Drug name: Biktarvy         Strength: 50/200/25 mg       Qty: 7 tablets (1 bottles) LOT: [id number]   Exp.Date: 1/28  Samples requested by Duwaine Lowe, RN.  Dosing instructions: Take one tablet by mouth once daily  The patient has been instructed regarding the correct time, dose, and frequency of taking this medication, including desired effects and most common side effects.   Alan Geralds, PharmD, CPP, BCIDP, AAHIVP Clinical Pharmacist Practitioner Infectious Diseases Clinical Pharmacist [facility]

## 2024-11-25 ENCOUNTER — Other Ambulatory Visit (HOSPITAL_COMMUNITY): Payer: Self-pay

## 2024-11-25 ENCOUNTER — Ambulatory Visit: Payer: Self-pay | Admitting: Infectious Diseases

## 2024-11-25 ENCOUNTER — Encounter: Payer: Self-pay | Admitting: Infectious Diseases

## 2024-11-25 ENCOUNTER — Other Ambulatory Visit: Payer: Self-pay

## 2024-11-25 VITALS — BP 129/83 | HR 74 | Temp 98.1°F | Ht 61.0 in | Wt 252.0 lb

## 2024-11-25 DIAGNOSIS — Z0000 Encounter for general adult medical examination without abnormal findings: Secondary | ICD-10-CM

## 2024-11-25 DIAGNOSIS — Z23 Encounter for immunization: Secondary | ICD-10-CM

## 2024-11-25 DIAGNOSIS — Z113 Encounter for screening for infections with a predominantly sexual mode of transmission: Secondary | ICD-10-CM

## 2024-11-25 DIAGNOSIS — B20 Human immunodeficiency virus [HIV] disease: Secondary | ICD-10-CM

## 2024-11-25 DIAGNOSIS — Z5181 Encounter for therapeutic drug level monitoring: Secondary | ICD-10-CM

## 2024-11-25 DIAGNOSIS — Z7185 Encounter for immunization safety counseling: Secondary | ICD-10-CM

## 2024-11-25 DIAGNOSIS — Z79899 Other long term (current) drug therapy: Secondary | ICD-10-CM

## 2024-11-25 DIAGNOSIS — E785 Hyperlipidemia, unspecified: Secondary | ICD-10-CM

## 2024-11-25 MED ORDER — BIKTARVY 50-200-25 MG PO TABS: 1.0000 | ORAL_TABLET | Freq: Every day | ORAL | 3 refills | Status: AC

## 2024-11-25 MED ORDER — ATORVASTATIN CALCIUM 10 MG PO TABS: 10.0000 mg | ORAL_TABLET | Freq: Every day | ORAL | 5 refills | Status: AC

## 2024-11-25 NOTE — Progress Notes (Signed)
 "                                                                                                                                                                                                      [address], [place], [place], [address]                                                                  Phn. [phone]; Fax: [phone]                                                                             Date: 11/25/24  Reason for Visit: HIV fu  HPI: Kathleen Savage is a 49 y.o.old female with a history of HIV,  prediabetes and HLD, previously seen by Dr Elaine for a long time who is here for fu.   1/16  Compliant with biktarvy  with no missed doses or concerns. Willing to get HBV#2 and shingles vaccine. She is closely following with PCP. Referred to GI for blood in stool. Following with Orthopedics for arthralgia. Doing well with no other concerns.   ROS: As stated in above HPI; all other systems were reviewed and are otherwise negative unless noted below  No reported fever / chills, night sweats, unintentional weight loss, acute visual change, odynophagia, chest pain/pressure, new or worsened SOB or WOB, nausea, vomiting, diarrhea, dysuria, GU discharge, syncope, seizures, red/hot swollen joints, hallucinations / delusions, rashes, new allergies, unusual / excessive bleeding, swollen lymph nodes  PMH/ PSH/ FamHx / Social Hx , medications and allergies reviewed and updated as appropriate; please see corresponding tab in EHR / prior notes  Current Outpatient Medications on File Prior to Visit  Medication Sig Dispense Refill   acetaminophen -codeine  (TYLENOL  #3) 300-30 MG tablet Take 1-2 tablets by mouth every 8 (eight) hours as needed for moderate pain (pain score 4-6). 60 tablet 0   atorvastatin  (LIPITOR) 10 MG tablet Take 1 tablet (10 mg total) by mouth daily. (Patient not taking: Reported on 10/25/2024) 30 tablet 5   bictegravir-emtricitabine-tenofovir  AF (BIKTARVY )  50-200-25 MG TABS tablet Take 1 tablet by mouth daily. 30 tablet 11   meloxicam  (MOBIC ) 15 MG tablet Take 1 tablet (15 mg total) by mouth daily as needed for pain. (Patient not taking: Reported on 10/25/2024) 30 tablet 0   methylPREDNISolone  (MEDROL  DOSEPAK) 4 MG TBPK tablet Take by mouth daily, taper daily dose per package instructions. (Patient not taking: Reported on 10/25/2024) 21 tablet 0   No current facility-administered medications on file prior to visit.   No Known Allergies  Past Medical History:  Diagnosis Date   Glucose intolerance (pre-diabetes)    HIV infection (HCC) 2005   Hyperlipidemia    Past Surgical History:  Procedure Laterality Date   LOWER EXTREMITY VENOGRAPHY Left 05/12/2023   Procedure: LOWER EXTREMITY VENOGRAPHY;  Surgeon: Serene Gaile ORN, MD;  Location: [facility];  Service: Cardiovascular;  Laterality: Left;   PERIPHERAL VASCULAR BALLOON ANGIOPLASTY  05/12/2023   Procedure: PERIPHERAL VASCULAR BALLOON ANGIOPLASTY;  Surgeon: Serene Gaile ORN, MD;  Location: [facility];  Service: Cardiovascular;;   PERIPHERAL VASCULAR THROMBECTOMY N/A 05/12/2023   Procedure: MECHANICAL THROMBECTOMY;  Surgeon: Serene Gaile ORN, MD;  Location: [facility];  Service: Cardiovascular;  Laterality: N/A;    Social History   Socioeconomic History   Marital status: Married    Spouse name: Not on file   Number of children: 2   Years of education: Not on file   Highest education level: 8th grade  Occupational History   Not on file  Tobacco Use   Smoking status: Never   Smokeless tobacco: Never  Vaping Use   Vaping status: Never Used  Substance and Sexual Activity   Alcohol use: Yes    Alcohol/week: 3.0 standard drinks of alcohol    Types: 1 Glasses of wine, 1 Cans of beer, 1 Shots of liquor per week    Comment: occasional   Drug use: No   Sexual activity: Yes    Partners: Male    Birth control/protection: Condom    Comment: given condoms  Other Topics  Concern   Not on file  Social History Narrative   Not on file   Social Drivers of Health   Tobacco Use: Low Risk (11/25/2024)   Patient History    Smoking Tobacco Use: Never    Smokeless Tobacco Use: Never    Passive Exposure: Not on file  Financial Resource Strain: Medium Risk (10/24/2024)   Overall Financial Resource Strain (CARDIA)    Difficulty of Paying Living Expenses: Somewhat hard  Food Insecurity: No Food Insecurity (10/24/2024)   Epic    Worried About Radiation Protection Practitioner of Food in the Last Year: Never true    Ran Out of Food in the Last Year: Never true  Transportation Needs: No Transportation Needs (10/24/2024)   Epic    Lack of Transportation (Medical): No    Lack of Transportation (Non-Medical): No  Physical Activity: Inactive (10/24/2024)   Exercise Vital Sign    Days of Exercise per Week: 0 days    Minutes of Exercise per Session: Not on file  Stress: No Stress Concern Present (10/24/2024)   [employer] of Occupational Health - Occupational Stress Questionnaire    Feeling of Stress: Only a little  Social Connections: Moderately Isolated (10/24/2024)   Social Connection and Isolation Panel    Frequency of Communication with Friends and Family: More than three times a week    Frequency of Social Gatherings with Friends and Family: Once a week  Attends Religious Services: Never    Active Member of Clubs or Organizations: No    Attends Banker Meetings: Not on file    Marital Status: Married  Intimate Partner Violence: Not At Risk (10/02/2023)   Humiliation, Afraid, Rape, and Kick questionnaire    Fear of Current or Ex-Partner: No    Emotionally Abused: No    Physically Abused: No    Sexually Abused: No  Depression (PHQ2-9): Medium Risk (10/25/2024)   Depression (PHQ2-9)    PHQ-2 Score: 5  Alcohol Screen: Low Risk (10/24/2024)   Alcohol Screen    Last Alcohol Screening Score (AUDIT): 5  Housing: Unknown (10/24/2024)   Epic    Unable to Pay  for Housing in the Last Year: No    Number of Times Moved in the Last Year: Not on file    Homeless in the Last Year: No  Utilities: Not At Risk (10/02/2023)   [employer] Utilities    Threatened with loss of utilities: No  Health Literacy: Adequate Health Literacy (10/02/2023)   B1300 Health Literacy    Frequency of need for help with medical instructions: Rarely   Family History  Problem Relation Age of Onset   Cancer Maternal Aunt        Breast Cancer   Breast cancer Maternal Aunt    Cancer Cousin        Breast cancer - maternal cousin   Breast cancer Cousin    Cancer Cousin        breast cancer   Breast cancer Cousin    Cancer Cousin        breast cancer   Cancer Maternal Aunt        stomach cancer   Breast cancer Maternal Aunt    Hypertension Mother    Stroke Father    Cancer Sister    Uterine cancer Sister     Vitals  BP 129/83   Pulse 74   Temp 98.1 F (36.7 C) (Oral)   Ht 5' 1 (1.549 m)   Wt 252 lb (114.3 kg)   SpO2 98%   BMI 47.61 kg/m   Examination  Gen: no acute distress, morbidly obese  HEENT: [place]/AT, no scleral icterus, no pale conjunctivae, hearing normal, oral mucosa moist Neck: Supple Cardio: Normal HR Resp: Pulmonary effort normal in room air GI: nondistended GU: Musc: Extremities: No pedal edema Skin: No rashes Neuro: grossly non focal , awake, al, ambulatory  Psych: Calm, cooperative  Lab Results HIV 1 RNA Quant (copies/mL)  Date Value  06/14/2024 <20 DETECTED (A)  03/21/2024 NOT DETECTED  01/12/2024 1,100,000 (H)   HIV-1 RNA Viral Load (copies/mL)  Date Value  12/23/2021 <20   CD4 T Cell Abs (/uL)  Date Value  06/14/2024 824  03/21/2024 835  01/12/2024 322 (L)   No results found for: HIV1GENOSEQ Lab Results  Component Value Date   WBC 7.1 10/25/2024   HGB 12.2 10/25/2024   HCT 37.0 10/25/2024   MCV 92 10/25/2024   PLT 363 10/25/2024    Lab Results  Component Value Date   CREATININE 0.62 06/14/2024   BUN 13 06/14/2024    NA 136 06/14/2024   K 3.9 06/14/2024   CL 104 06/14/2024   CO2 25 06/14/2024   Lab Results  Component Value Date   ALT 23 06/14/2024   AST 18 06/14/2024   ALKPHOS 46 12/17/2023   BILITOT 0.5 06/14/2024    Lab Results  Component Value Date   CHOL 164  01/12/2024   TRIG 133 01/12/2024   HDL 42 (L) 01/12/2024   LDLCALC 99 01/12/2024   Lab Results  Component Value Date   HAV REACTIVE (A) 03/21/2024   Lab Results  Component Value Date   HEPBSAG NEGATIVE 05/30/2014   HEPBSAB NON-REACTIVE 03/21/2024   Lab Results  Component Value Date   HCVAB NEGATIVE 05/30/2014   Lab Results  Component Value Date   CHLAMYDIAWP Negative 06/14/2024   N Negative 06/14/2024   No results found for: GCPROBEAPT No results found for: QUANTGOLD   Health Maintenance: Immunization History  Administered Date(s) Administered   H1N1 11/28/2008   Hepatitis B 12/04/2003, 07/02/2004, 11/27/2004   Hepatitis B, ADULT 06/13/2014, 01/23/2015   Hepb-cpg 06/14/2024   Influenza Split 08/04/2011, 07/27/2012   Influenza Whole 09/08/2006, 09/14/2007, 08/03/2008, 10/23/2009, 09/10/2010   Influenza, Seasonal, Injecte, Preservative Fre 01/12/2024, 10/25/2024   Influenza,inj,Quad PF,6+ Mos 11/29/2013, 01/23/2015, 08/09/2015, 07/22/2016, 07/28/2017, 09/14/2018, 12/23/2021, 12/29/2022   Meningococcal Mcv4o 01/27/2017, 09/14/2018   PNEUMOCOCCAL CONJUGATE-20 01/12/2024   Pneumococcal Conjugate-13 09/14/2018   Pneumococcal Polysaccharide-23 10/10/2003, 11/28/2008   Tdap 02/12/2022   Imaging 03/18/24 MRI L spine  IMPRESSION: Mild degenerative disc desiccation and moderate multilevel facet arthrosis. There is caudal foraminal narrowing most notably at L5-S1, left greater than right. Correlation left L5 radiculopathy.   No acute abnormality.  Assessment/Plan: # HIV - Adherence assessed, side effects reviewed/discussed and DDIs reviewed  - continue Biktarvy , refilled  - labs today  - fu in 5 months     # STD screening  - Urine GC and RPR  # Hyperlipidemia/Morbid Obesity/Prediabetes   - continue atorvastatin  10 mg po daily, refilled  - have discussed healthy diet including less sugary/fatty/fried food, more fruits, vegetables and regular exercise approx 150 mins of moderate intensity exercise every week   # Chronic LBP, RT hip pain - s/p joint injections - Fu with Orthopedics   # Immunization  COVID  Influenza 01/12/24 Monkeypox Pneumococcal PCV 13 09/2018, Pneumococcal PPV 09/2003 and  PCV 20 01/12/24 Meningococcal Menveo 01/2017 and 09/2018 Hep A ab total reactive  Hep B surface ab non reactive; HBV vaccine # 2 today Tdap 02/12/2022 Zoster # 1 today   # Health maintenance Lipid panel today  Hep C ab negative 2015 Hep B surface ag negative 05/2014 02/02/24 Papsmear negative  01/28/24 mammogram negative, next due in a year Colon ca screening - has been discussed, per PCP Dental Care has been discussed   I personally spent a total of 30 minutes in the care of the patient today including preparing to see the patient, getting/reviewing separately obtained history, performing a medically appropriate exam/evaluation, counseling and educating, placing orders, documenting clinical information in the EHR, independently interpreting results, and communicating results.   Electronically signed by: Annalee Orem, MD Infectious Disease Physician [facility] [address] [place], [place] [address] Phone: [phone]  Fax: [phone]  "

## 2024-11-28 LAB — LIPID PANEL
Cholesterol: 215 mg/dL — ABNORMAL HIGH
HDL: 61 mg/dL
LDL Cholesterol (Calc): 136 mg/dL — ABNORMAL HIGH
Non-HDL Cholesterol (Calc): 154 mg/dL — ABNORMAL HIGH
Total CHOL/HDL Ratio: 3.5 (calc)
Triglycerides: 84 mg/dL

## 2024-11-28 LAB — CBC
HCT: 36.3 % (ref 35.9–46.0)
Hemoglobin: 12 g/dL (ref 11.7–15.5)
MCH: 29.7 pg (ref 27.0–33.0)
MCHC: 33.1 g/dL (ref 31.6–35.4)
MCV: 89.9 fL (ref 81.4–101.7)
MPV: 10.3 fL (ref 7.5–12.5)
Platelets: 361 Thousand/uL (ref 140–400)
RBC: 4.04 Million/uL (ref 3.80–5.10)
RDW: 13.5 % (ref 11.0–15.0)
WBC: 6.2 Thousand/uL (ref 3.8–10.8)

## 2024-11-28 LAB — HIV RNA, RTPCR W/R GT (RTI, PI,INT)
HIV 1 RNA Quant: NOT DETECTED {copies}/mL
HIV-1 RNA Quant, Log: NOT DETECTED {Log_copies}/mL

## 2024-11-28 LAB — COMPREHENSIVE METABOLIC PANEL WITH GFR
AG Ratio: 1.4 (calc) (ref 1.0–2.5)
ALT: 19 U/L (ref 6–29)
AST: 18 U/L (ref 10–35)
Albumin: 4.2 g/dL (ref 3.6–5.1)
Alkaline phosphatase (APISO): 51 U/L (ref 31–125)
BUN: 14 mg/dL (ref 7–25)
CO2: 23 mmol/L (ref 20–32)
Calcium: 8.6 mg/dL (ref 8.6–10.2)
Chloride: 106 mmol/L (ref 98–110)
Creat: 0.56 mg/dL (ref 0.50–0.99)
Globulin: 3 g/dL (ref 1.9–3.7)
Glucose, Bld: 111 mg/dL — ABNORMAL HIGH (ref 65–99)
Potassium: 3.6 mmol/L (ref 3.5–5.3)
Sodium: 139 mmol/L (ref 135–146)
Total Bilirubin: 0.3 mg/dL (ref 0.2–1.2)
Total Protein: 7.2 g/dL (ref 6.1–8.1)
eGFR: 113 mL/min/1.73m2

## 2024-11-28 LAB — T-HELPER CELLS (CD4) COUNT (NOT AT ARMC)
Absolute CD4: 668 {cells}/uL (ref 490–1740)
CD4 T Helper %: 43 % (ref 30–61)
Total lymphocyte count: 1547 {cells}/uL (ref 850–3900)

## 2024-11-28 LAB — SYPHILIS: RPR W/REFLEX TO RPR TITER AND TREPONEMAL ANTIBODIES, TRADITIONAL SCREENING AND DIAGNOSIS ALGORITHM: RPR Ser Ql: NONREACTIVE

## 2024-11-29 ENCOUNTER — Ambulatory Visit: Payer: Self-pay | Admitting: Infectious Diseases

## 2025-01-24 ENCOUNTER — Ambulatory Visit: Payer: Self-pay

## 2025-04-11 ENCOUNTER — Ambulatory Visit: Payer: Self-pay | Admitting: Infectious Diseases
